# Patient Record
Sex: Female | Born: 1965 | Race: White | Hispanic: No | Marital: Single | Smoking: Never smoker
Health system: Southern US, Community
[De-identification: ages and names within clinical notes are randomized; demographics above are authoritative.]

## PROBLEM LIST (undated history)

## (undated) DIAGNOSIS — R1012 Left upper quadrant pain: Secondary | ICD-10-CM

## (undated) DIAGNOSIS — J309 Allergic rhinitis, unspecified: Secondary | ICD-10-CM

## (undated) DIAGNOSIS — E785 Hyperlipidemia, unspecified: Secondary | ICD-10-CM

## (undated) DIAGNOSIS — E119 Type 2 diabetes mellitus without complications: Secondary | ICD-10-CM

## (undated) DIAGNOSIS — R110 Nausea: Secondary | ICD-10-CM

## (undated) DIAGNOSIS — Z9889 Other specified postprocedural states: Secondary | ICD-10-CM

## (undated) DIAGNOSIS — I10 Essential (primary) hypertension: Secondary | ICD-10-CM

## (undated) DIAGNOSIS — M1990 Unspecified osteoarthritis, unspecified site: Secondary | ICD-10-CM

## (undated) DIAGNOSIS — R51 Headache: Secondary | ICD-10-CM

## (undated) DIAGNOSIS — J45909 Unspecified asthma, uncomplicated: Secondary | ICD-10-CM

## (undated) DIAGNOSIS — R112 Nausea with vomiting, unspecified: Secondary | ICD-10-CM

## (undated) DIAGNOSIS — K589 Irritable bowel syndrome without diarrhea: Secondary | ICD-10-CM

## (undated) DIAGNOSIS — K219 Gastro-esophageal reflux disease without esophagitis: Secondary | ICD-10-CM

## (undated) DIAGNOSIS — M545 Low back pain: Secondary | ICD-10-CM

## (undated) HISTORY — DX: Nausea: R11.0

## (undated) HISTORY — PX: KNEE SURGERY: SHX244

## (undated) HISTORY — DX: Low back pain: M54.5

## (undated) HISTORY — DX: Allergic rhinitis, unspecified: J30.9

## (undated) HISTORY — DX: Hyperlipidemia, unspecified: E78.5

## (undated) HISTORY — DX: Essential (primary) hypertension: I10

## (undated) HISTORY — PX: TUBAL LIGATION: SHX77

## (undated) HISTORY — DX: Irritable bowel syndrome, unspecified: K58.9

## (undated) HISTORY — DX: Unspecified asthma, uncomplicated: J45.909

## (undated) HISTORY — DX: Left upper quadrant pain: R10.12

## (undated) HISTORY — DX: Headache: R51

## (undated) HISTORY — DX: Gastro-esophageal reflux disease without esophagitis: K21.9

---

## 1986-10-06 HISTORY — PX: KNEE SURGERY: SHX244

## 1993-10-06 HISTORY — PX: NASAL POLYP SURGERY: SHX186

## 1998-11-09 ENCOUNTER — Other Ambulatory Visit: Admission: RE | Admit: 1998-11-09 | Discharge: 1998-11-09 | Payer: Self-pay | Admitting: Internal Medicine

## 2001-06-01 ENCOUNTER — Other Ambulatory Visit: Admission: RE | Admit: 2001-06-01 | Discharge: 2001-06-01 | Payer: Self-pay | Admitting: Internal Medicine

## 2001-10-22 ENCOUNTER — Encounter: Admission: RE | Admit: 2001-10-22 | Discharge: 2001-10-22 | Payer: Self-pay | Admitting: Internal Medicine

## 2001-10-22 ENCOUNTER — Encounter: Payer: Self-pay | Admitting: Internal Medicine

## 2002-11-11 ENCOUNTER — Inpatient Hospital Stay (HOSPITAL_COMMUNITY): Admission: AD | Admit: 2002-11-11 | Discharge: 2002-11-11 | Payer: Self-pay | Admitting: Family Medicine

## 2003-10-07 HISTORY — PX: LUMBAR FUSION: SHX111

## 2003-12-06 ENCOUNTER — Encounter: Admission: RE | Admit: 2003-12-06 | Discharge: 2003-12-06 | Payer: Self-pay | Admitting: Internal Medicine

## 2004-01-12 ENCOUNTER — Encounter: Admission: RE | Admit: 2004-01-12 | Discharge: 2004-01-12 | Payer: Self-pay | Admitting: Neurosurgery

## 2004-03-20 ENCOUNTER — Inpatient Hospital Stay (HOSPITAL_COMMUNITY): Admission: RE | Admit: 2004-03-20 | Discharge: 2004-03-22 | Payer: Self-pay | Admitting: Neurosurgery

## 2004-03-28 ENCOUNTER — Encounter: Admission: RE | Admit: 2004-03-28 | Discharge: 2004-03-28 | Payer: Self-pay | Admitting: Neurosurgery

## 2004-05-02 ENCOUNTER — Encounter: Admission: RE | Admit: 2004-05-02 | Discharge: 2004-05-02 | Payer: Self-pay | Admitting: Neurosurgery

## 2004-06-04 ENCOUNTER — Encounter: Admission: RE | Admit: 2004-06-04 | Discharge: 2004-06-04 | Payer: Self-pay | Admitting: Neurosurgery

## 2004-06-09 ENCOUNTER — Encounter: Admission: RE | Admit: 2004-06-09 | Discharge: 2004-06-09 | Payer: Self-pay | Admitting: Neurosurgery

## 2004-09-17 ENCOUNTER — Ambulatory Visit: Payer: Self-pay | Admitting: Internal Medicine

## 2004-11-07 ENCOUNTER — Encounter: Admission: RE | Admit: 2004-11-07 | Discharge: 2004-11-07 | Payer: Self-pay | Admitting: Neurosurgery

## 2004-12-11 ENCOUNTER — Ambulatory Visit: Payer: Self-pay | Admitting: Internal Medicine

## 2005-01-28 ENCOUNTER — Ambulatory Visit: Payer: Self-pay | Admitting: Internal Medicine

## 2005-02-04 ENCOUNTER — Ambulatory Visit: Payer: Self-pay | Admitting: Internal Medicine

## 2005-02-11 ENCOUNTER — Encounter: Admission: RE | Admit: 2005-02-11 | Discharge: 2005-02-11 | Payer: Self-pay | Admitting: Internal Medicine

## 2005-05-08 ENCOUNTER — Encounter: Admission: RE | Admit: 2005-05-08 | Discharge: 2005-05-08 | Payer: Self-pay | Admitting: Neurosurgery

## 2005-05-12 ENCOUNTER — Ambulatory Visit: Payer: Self-pay | Admitting: Internal Medicine

## 2005-06-13 ENCOUNTER — Encounter: Admission: RE | Admit: 2005-06-13 | Discharge: 2005-06-13 | Payer: Self-pay | Admitting: Internal Medicine

## 2005-06-13 ENCOUNTER — Ambulatory Visit: Payer: Self-pay | Admitting: Internal Medicine

## 2005-06-17 ENCOUNTER — Ambulatory Visit: Payer: Self-pay

## 2005-10-01 ENCOUNTER — Ambulatory Visit: Payer: Self-pay | Admitting: Endocrinology

## 2005-11-26 ENCOUNTER — Ambulatory Visit: Payer: Self-pay | Admitting: Internal Medicine

## 2005-11-26 ENCOUNTER — Ambulatory Visit: Payer: Self-pay | Admitting: Cardiology

## 2006-01-13 ENCOUNTER — Ambulatory Visit (HOSPITAL_COMMUNITY): Admission: RE | Admit: 2006-01-13 | Discharge: 2006-01-13 | Payer: Self-pay | Admitting: *Deleted

## 2006-01-28 ENCOUNTER — Ambulatory Visit: Payer: Self-pay | Admitting: Internal Medicine

## 2006-03-31 ENCOUNTER — Ambulatory Visit: Payer: Self-pay | Admitting: Internal Medicine

## 2006-04-15 ENCOUNTER — Encounter: Payer: Self-pay | Admitting: Internal Medicine

## 2006-04-15 ENCOUNTER — Ambulatory Visit (HOSPITAL_COMMUNITY): Admission: RE | Admit: 2006-04-15 | Discharge: 2006-04-15 | Payer: Self-pay | Admitting: Internal Medicine

## 2006-05-05 ENCOUNTER — Ambulatory Visit: Payer: Self-pay | Admitting: Internal Medicine

## 2006-05-05 LAB — HM COLONOSCOPY

## 2006-05-07 ENCOUNTER — Ambulatory Visit: Payer: Self-pay | Admitting: Internal Medicine

## 2006-05-12 ENCOUNTER — Ambulatory Visit: Payer: Self-pay | Admitting: Internal Medicine

## 2006-05-21 ENCOUNTER — Ambulatory Visit: Payer: Self-pay | Admitting: Internal Medicine

## 2006-07-02 ENCOUNTER — Ambulatory Visit: Payer: Self-pay | Admitting: Internal Medicine

## 2006-08-09 ENCOUNTER — Inpatient Hospital Stay (HOSPITAL_COMMUNITY): Admission: AD | Admit: 2006-08-09 | Discharge: 2006-08-09 | Payer: Self-pay | Admitting: *Deleted

## 2006-08-31 ENCOUNTER — Ambulatory Visit: Payer: Self-pay | Admitting: Internal Medicine

## 2006-09-02 ENCOUNTER — Ambulatory Visit: Payer: Self-pay | Admitting: Internal Medicine

## 2006-09-14 ENCOUNTER — Encounter: Admission: RE | Admit: 2006-09-14 | Discharge: 2006-12-13 | Payer: Self-pay | Admitting: *Deleted

## 2006-09-14 ENCOUNTER — Encounter: Admission: RE | Admit: 2006-09-14 | Discharge: 2006-09-14 | Payer: Self-pay | Admitting: *Deleted

## 2006-09-24 ENCOUNTER — Ambulatory Visit: Payer: Self-pay | Admitting: Internal Medicine

## 2006-10-12 ENCOUNTER — Ambulatory Visit: Payer: Self-pay | Admitting: Internal Medicine

## 2006-11-27 ENCOUNTER — Ambulatory Visit (HOSPITAL_COMMUNITY): Admission: RE | Admit: 2006-11-27 | Discharge: 2006-11-27 | Payer: Self-pay | Admitting: *Deleted

## 2006-12-14 ENCOUNTER — Inpatient Hospital Stay (HOSPITAL_COMMUNITY): Admission: AD | Admit: 2006-12-14 | Discharge: 2006-12-15 | Payer: Self-pay | Admitting: *Deleted

## 2007-01-10 ENCOUNTER — Inpatient Hospital Stay (HOSPITAL_COMMUNITY): Admission: AD | Admit: 2007-01-10 | Discharge: 2007-01-12 | Payer: Self-pay | Admitting: Obstetrics and Gynecology

## 2007-01-20 ENCOUNTER — Ambulatory Visit: Payer: Self-pay | Admitting: Internal Medicine

## 2007-01-23 ENCOUNTER — Inpatient Hospital Stay (HOSPITAL_COMMUNITY): Admission: AD | Admit: 2007-01-23 | Discharge: 2007-01-23 | Payer: Self-pay | Admitting: Obstetrics and Gynecology

## 2007-01-27 ENCOUNTER — Inpatient Hospital Stay (HOSPITAL_COMMUNITY): Admission: AD | Admit: 2007-01-27 | Discharge: 2007-01-27 | Payer: Self-pay | Admitting: Obstetrics and Gynecology

## 2007-01-28 ENCOUNTER — Inpatient Hospital Stay (HOSPITAL_COMMUNITY): Admission: AD | Admit: 2007-01-28 | Discharge: 2007-01-31 | Payer: Self-pay | Admitting: *Deleted

## 2007-01-28 ENCOUNTER — Encounter (INDEPENDENT_AMBULATORY_CARE_PROVIDER_SITE_OTHER): Payer: Self-pay | Admitting: Specialist

## 2007-02-11 ENCOUNTER — Ambulatory Visit: Payer: Self-pay | Admitting: Internal Medicine

## 2007-04-22 ENCOUNTER — Encounter: Payer: Self-pay | Admitting: Internal Medicine

## 2007-04-22 DIAGNOSIS — I10 Essential (primary) hypertension: Secondary | ICD-10-CM

## 2007-04-22 DIAGNOSIS — E785 Hyperlipidemia, unspecified: Secondary | ICD-10-CM

## 2007-04-22 DIAGNOSIS — K219 Gastro-esophageal reflux disease without esophagitis: Secondary | ICD-10-CM

## 2007-04-22 DIAGNOSIS — J45909 Unspecified asthma, uncomplicated: Secondary | ICD-10-CM

## 2007-04-22 HISTORY — DX: Essential (primary) hypertension: I10

## 2007-04-22 HISTORY — DX: Gastro-esophageal reflux disease without esophagitis: K21.9

## 2007-04-22 HISTORY — DX: Hyperlipidemia, unspecified: E78.5

## 2007-04-22 HISTORY — DX: Unspecified asthma, uncomplicated: J45.909

## 2007-04-27 ENCOUNTER — Ambulatory Visit: Payer: Self-pay | Admitting: Internal Medicine

## 2007-07-07 LAB — HM MAMMOGRAPHY

## 2007-08-04 ENCOUNTER — Encounter: Payer: Self-pay | Admitting: Internal Medicine

## 2007-09-28 ENCOUNTER — Ambulatory Visit: Payer: Self-pay | Admitting: Internal Medicine

## 2007-10-05 ENCOUNTER — Ambulatory Visit: Payer: Self-pay | Admitting: Internal Medicine

## 2007-10-05 LAB — CONVERTED CEMR LAB
Albumin: 3.9 g/dL (ref 3.5–5.2)
BUN: 15 mg/dL (ref 6–23)
Basophils Relative: 0.6 % (ref 0.0–1.0)
Bilirubin Urine: NEGATIVE
GFR calc Af Amer: 89 mL/min
GFR calc non Af Amer: 74 mL/min
HCT: 37.1 % (ref 36.0–46.0)
HDL: 43.8 mg/dL (ref >39.0)
Leukocytes, UA: NEGATIVE
Lymphocytes Relative: 20.5 % (ref 12.0–46.0)
MCHC: 33.9 g/dL (ref 30.0–36.0)
MCV: 90.4 fL (ref 78.0–100.0)
Monocytes Absolute: 0.5 10*3/uL (ref 0.2–0.7)
Neutro Abs: 3.6 10*3/uL (ref 1.4–7.7)
Neutrophils Relative %: 64.9 % (ref 43.0–77.0)
Nitrite: NEGATIVE
Potassium: 4.3 meq/L (ref 3.5–5.1)
RDW: 12 % (ref 11.5–14.6)
Sodium: 139 meq/L (ref 135–145)
Specific Gravity, Urine: >=1.03 (ref 1.000–1.03)
TSH: 1.36 microintl units/mL (ref 0.35–5.50)
Total Protein, Urine: NEGATIVE mg/dL
Triglycerides: 198 mg/dL — ABNORMAL HIGH (ref 0–149)
Urine Glucose: NEGATIVE mg/dL
VLDL: 40 mg/dL (ref 0–40)
WBC: 5.5 10*3/uL (ref 4.5–10.5)
pH: 5.5 (ref 5.0–8.0)

## 2007-10-06 ENCOUNTER — Encounter: Payer: Self-pay | Admitting: Internal Medicine

## 2007-10-08 ENCOUNTER — Ambulatory Visit: Payer: Self-pay | Admitting: Internal Medicine

## 2007-10-08 DIAGNOSIS — J309 Allergic rhinitis, unspecified: Secondary | ICD-10-CM

## 2007-10-08 DIAGNOSIS — E119 Type 2 diabetes mellitus without complications: Secondary | ICD-10-CM | POA: Insufficient documentation

## 2007-10-08 DIAGNOSIS — R519 Headache, unspecified: Secondary | ICD-10-CM | POA: Insufficient documentation

## 2007-10-08 DIAGNOSIS — R05 Cough: Secondary | ICD-10-CM | POA: Insufficient documentation

## 2007-10-08 DIAGNOSIS — R51 Headache: Secondary | ICD-10-CM

## 2007-10-08 HISTORY — DX: Allergic rhinitis, unspecified: J30.9

## 2007-10-08 HISTORY — DX: Headache: R51

## 2007-11-01 ENCOUNTER — Telehealth: Payer: Self-pay | Admitting: Internal Medicine

## 2007-11-08 ENCOUNTER — Telehealth: Payer: Self-pay | Admitting: Internal Medicine

## 2007-11-26 ENCOUNTER — Encounter: Payer: Self-pay | Admitting: Internal Medicine

## 2008-01-07 ENCOUNTER — Ambulatory Visit: Payer: Self-pay | Admitting: Internal Medicine

## 2008-01-07 LAB — CONVERTED CEMR LAB
BUN: 18 mg/dL (ref 6–23)
Chloride: 107 meq/L (ref 96–112)
Cholesterol: 242 mg/dL (ref 0–200)
Creatinine, Ser: 0.9 mg/dL (ref 0.4–1.2)
Direct LDL: 180.3 mg/dL
HDL: 36.9 mg/dL — ABNORMAL LOW (ref >39.0)
Hgb A1c MFr Bld: 6.5 % — ABNORMAL HIGH (ref 4.6–6.0)
Triglycerides: 143 mg/dL (ref 0–149)
VLDL: 29 mg/dL (ref 0–40)

## 2008-01-08 ENCOUNTER — Encounter: Payer: Self-pay | Admitting: Internal Medicine

## 2008-01-10 ENCOUNTER — Encounter: Payer: Self-pay | Admitting: Internal Medicine

## 2008-01-25 ENCOUNTER — Encounter: Payer: Self-pay | Admitting: Internal Medicine

## 2008-02-14 ENCOUNTER — Ambulatory Visit: Payer: Self-pay | Admitting: Internal Medicine

## 2008-02-14 DIAGNOSIS — J069 Acute upper respiratory infection, unspecified: Secondary | ICD-10-CM | POA: Insufficient documentation

## 2008-03-17 ENCOUNTER — Ambulatory Visit: Payer: Self-pay | Admitting: Internal Medicine

## 2008-03-17 LAB — CONVERTED CEMR LAB
ALT: 28 U/L
AST: 23 U/L
Albumin: 4.1 g/dL
Alkaline Phosphatase: 46 U/L
Bilirubin, Direct: 0.1 mg/dL
Cholesterol: 230 mg/dL
Direct LDL: 154.4 mg/dL
HDL: 46 mg/dL
Total Bilirubin: 0.8 mg/dL
Total CHOL/HDL Ratio: 5
Total Protein: 6.3 g/dL
Triglycerides: 137 mg/dL
VLDL: 27 mg/dL

## 2008-03-21 ENCOUNTER — Encounter: Payer: Self-pay | Admitting: Internal Medicine

## 2008-04-14 ENCOUNTER — Telehealth: Payer: Self-pay | Admitting: Internal Medicine

## 2008-04-20 ENCOUNTER — Ambulatory Visit: Payer: Self-pay | Admitting: Internal Medicine

## 2008-04-20 LAB — CONVERTED CEMR LAB
Cholesterol: 197 mg/dL (ref 0–200)
Total CHOL/HDL Ratio: 4.6
VLDL: 20 mg/dL (ref 0–40)

## 2008-04-23 ENCOUNTER — Encounter: Payer: Self-pay | Admitting: Internal Medicine

## 2008-06-27 ENCOUNTER — Ambulatory Visit: Payer: Self-pay | Admitting: Internal Medicine

## 2008-06-27 DIAGNOSIS — M545 Low back pain, unspecified: Secondary | ICD-10-CM

## 2008-06-27 DIAGNOSIS — J0100 Acute maxillary sinusitis, unspecified: Secondary | ICD-10-CM

## 2008-06-27 HISTORY — DX: Low back pain, unspecified: M54.50

## 2008-07-11 ENCOUNTER — Encounter: Payer: Self-pay | Admitting: Internal Medicine

## 2008-09-06 ENCOUNTER — Telehealth: Payer: Self-pay | Admitting: Internal Medicine

## 2008-09-11 ENCOUNTER — Telehealth: Payer: Self-pay | Admitting: Internal Medicine

## 2008-10-05 ENCOUNTER — Ambulatory Visit: Payer: Self-pay | Admitting: Internal Medicine

## 2008-10-05 LAB — CONVERTED CEMR LAB
ALT: 33 units/L (ref 0–35)
Albumin: 3.8 g/dL (ref 3.5–5.2)
BUN: 15 mg/dL (ref 6–23)
Chloride: 105 meq/L (ref 96–112)
Cholesterol: 216 mg/dL (ref 0–200)
Direct LDL: 134.7 mg/dL
Glucose, Bld: 119 mg/dL — ABNORMAL HIGH (ref 70–99)
HCT: 35.2 % — ABNORMAL LOW (ref 36.0–46.0)
HDL: 52.3 mg/dL (ref >39.0)
Leukocytes, UA: NEGATIVE
Lymphocytes Relative: 25.7 % (ref 12.0–46.0)
MCHC: 32.9 g/dL (ref 30.0–36.0)
Monocytes Relative: 7.4 % (ref 3.0–12.0)
Neutro Abs: 2.9 10*3/uL (ref 1.4–7.7)
Neutrophils Relative %: 59.9 % (ref 43.0–77.0)
Nitrite: NEGATIVE
Platelets: 178 10*3/uL (ref 150–400)
Potassium: 3.8 meq/L (ref 3.5–5.1)
RBC: 4.44 M/uL (ref 3.87–5.11)
RDW: 14.8 % — ABNORMAL HIGH (ref 11.5–14.6)
Sodium: 140 meq/L (ref 135–145)
Specific Gravity, Urine: 1.025 (ref 1.000–1.03)
TSH: 1.94 microintl units/mL (ref 0.35–5.50)
Total Protein: 5.9 g/dL — ABNORMAL LOW (ref 6.0–8.3)
Triglycerides: 141 mg/dL (ref 0–149)
Urine Glucose: NEGATIVE mg/dL
Urobilinogen, UA: 0.2 (ref 0.0–1.0)
pH: 6.5 (ref 5.0–8.0)

## 2008-10-09 ENCOUNTER — Ambulatory Visit: Payer: Self-pay | Admitting: Internal Medicine

## 2008-10-11 ENCOUNTER — Telehealth: Payer: Self-pay | Admitting: Internal Medicine

## 2008-12-25 ENCOUNTER — Telehealth: Payer: Self-pay | Admitting: Internal Medicine

## 2009-01-19 ENCOUNTER — Encounter: Payer: Self-pay | Admitting: Internal Medicine

## 2009-01-29 ENCOUNTER — Encounter: Payer: Self-pay | Admitting: Internal Medicine

## 2009-02-03 ENCOUNTER — Encounter: Payer: Self-pay | Admitting: Internal Medicine

## 2009-02-06 ENCOUNTER — Ambulatory Visit: Payer: Self-pay | Admitting: Internal Medicine

## 2009-02-13 ENCOUNTER — Telehealth: Payer: Self-pay | Admitting: Internal Medicine

## 2009-02-14 ENCOUNTER — Ambulatory Visit: Payer: Self-pay | Admitting: Internal Medicine

## 2009-02-15 ENCOUNTER — Encounter: Payer: Self-pay | Admitting: Internal Medicine

## 2009-02-23 ENCOUNTER — Telehealth: Payer: Self-pay | Admitting: Internal Medicine

## 2009-02-28 ENCOUNTER — Ambulatory Visit: Payer: Self-pay | Admitting: Internal Medicine

## 2009-02-28 ENCOUNTER — Encounter: Payer: Self-pay | Admitting: Internal Medicine

## 2009-02-28 DIAGNOSIS — J45901 Unspecified asthma with (acute) exacerbation: Secondary | ICD-10-CM | POA: Insufficient documentation

## 2009-03-13 ENCOUNTER — Ambulatory Visit: Payer: Self-pay | Admitting: Internal Medicine

## 2009-03-19 ENCOUNTER — Telehealth: Payer: Self-pay | Admitting: Internal Medicine

## 2009-03-27 ENCOUNTER — Ambulatory Visit: Payer: Self-pay | Admitting: Internal Medicine

## 2009-03-27 LAB — HM COLONOSCOPY

## 2009-04-16 ENCOUNTER — Telehealth: Payer: Self-pay | Admitting: Internal Medicine

## 2009-05-25 ENCOUNTER — Ambulatory Visit: Payer: Self-pay | Admitting: Internal Medicine

## 2009-05-25 DIAGNOSIS — R197 Diarrhea, unspecified: Secondary | ICD-10-CM

## 2009-06-20 ENCOUNTER — Telehealth (INDEPENDENT_AMBULATORY_CARE_PROVIDER_SITE_OTHER): Payer: Self-pay | Admitting: *Deleted

## 2009-12-03 ENCOUNTER — Encounter: Payer: Self-pay | Admitting: Internal Medicine

## 2009-12-05 ENCOUNTER — Ambulatory Visit: Payer: Self-pay | Admitting: Internal Medicine

## 2009-12-05 DIAGNOSIS — R062 Wheezing: Secondary | ICD-10-CM

## 2010-07-21 ENCOUNTER — Encounter: Payer: Self-pay | Admitting: Internal Medicine

## 2010-07-30 ENCOUNTER — Ambulatory Visit: Payer: Self-pay | Admitting: Internal Medicine

## 2010-08-05 ENCOUNTER — Ambulatory Visit: Payer: Self-pay | Admitting: Internal Medicine

## 2010-08-05 DIAGNOSIS — J209 Acute bronchitis, unspecified: Secondary | ICD-10-CM

## 2010-08-30 ENCOUNTER — Ambulatory Visit: Payer: Self-pay | Admitting: Internal Medicine

## 2010-08-30 LAB — CONVERTED CEMR LAB
ALT: 25 U/L
AST: 23 U/L
Albumin: 4.3 g/dL
Alkaline Phosphatase: 48 U/L
BUN: 16 mg/dL
Basophils Absolute: 0 K/uL
Basophils Relative: 0.6 %
Bilirubin Urine: NEGATIVE
Bilirubin, Direct: 0.1 mg/dL
CO2: 26 meq/L
Calcium: 9.6 mg/dL
Chloride: 105 meq/L
Cholesterol: 248 mg/dL — ABNORMAL HIGH
Creatinine, Ser: 0.9 mg/dL
Direct LDL: 172.6 mg/dL
Eosinophils Absolute: 0.2 K/uL
Eosinophils Relative: 3.8 %
GFR calc non Af Amer: 73.3 mL/min
Glucose, Bld: 133 mg/dL — ABNORMAL HIGH
HCT: 37.2 %
HDL: 55.9 mg/dL
Hemoglobin, Urine: NEGATIVE
Hemoglobin: 12.3 g/dL
Ketones, ur: NEGATIVE mg/dL
Leukocytes, UA: NEGATIVE
Lymphocytes Relative: 26.6 %
Lymphs Abs: 1.5 K/uL
MCHC: 33.1 g/dL
MCV: 77.2 fL — ABNORMAL LOW
Monocytes Absolute: 0.5 K/uL
Monocytes Relative: 8.5 %
Neutro Abs: 3.4 K/uL
Neutrophils Relative %: 60.5 %
Nitrite: NEGATIVE
Platelets: 216 K/uL
Potassium: 4.2 meq/L
RBC: 4.83 M/uL
RDW: 17.7 % — ABNORMAL HIGH
Sodium: 134 meq/L — ABNORMAL LOW
Specific Gravity, Urine: 1.025
TSH: 1.86 u[IU]/mL
Total Bilirubin: 0.7 mg/dL
Total CHOL/HDL Ratio: 4
Total Protein, Urine: NEGATIVE mg/dL
Total Protein: 6.7 g/dL
Triglycerides: 89 mg/dL
Urine Glucose: NEGATIVE mg/dL
Urobilinogen, UA: 0.2
VLDL: 17.8 mg/dL
WBC: 5.6 10*3/microliter
pH: 6

## 2010-09-05 ENCOUNTER — Telehealth: Payer: Self-pay | Admitting: Internal Medicine

## 2010-09-05 ENCOUNTER — Ambulatory Visit: Payer: Self-pay | Admitting: Internal Medicine

## 2010-09-06 ENCOUNTER — Ambulatory Visit: Payer: Self-pay | Admitting: Gastroenterology

## 2010-09-06 DIAGNOSIS — R110 Nausea: Secondary | ICD-10-CM

## 2010-09-06 DIAGNOSIS — R1012 Left upper quadrant pain: Secondary | ICD-10-CM

## 2010-09-06 HISTORY — DX: Left upper quadrant pain: R10.12

## 2010-09-06 HISTORY — DX: Nausea: R11.0

## 2010-09-10 ENCOUNTER — Encounter: Payer: Self-pay | Admitting: Internal Medicine

## 2010-10-02 ENCOUNTER — Ambulatory Visit: Payer: Self-pay | Admitting: Internal Medicine

## 2010-10-15 ENCOUNTER — Telehealth: Payer: Self-pay | Admitting: Nurse Practitioner

## 2010-10-27 ENCOUNTER — Encounter: Payer: Self-pay | Admitting: *Deleted

## 2010-11-05 NOTE — Assessment & Plan Note (Signed)
Summary: Cpx/#/cd   Vital Signs:  Patient profile:   45 year old female Height:      63 inches Weight:      142 pounds BMI:     25.25 O2 Sat:      96 % on Room air Temp:     98.0 degrees F oral Pulse rate:   81 / minute BP sitting:   120 / 88  (left arm) Cuff size:   regular  Vitals Entered By: Bill Salinas CMA (September 05, 2010 1:31 PM)  O2 Flow:  Room air CC: cpx/ ab Comments PT is not taking lovastatin, pulmicort, piroxicam, amitiza   Primary Care Provider:  Micheal Norins,MD  CC:  cpx/ ab.  History of Present Illness: Patient presents for annual medical wellness exam. She is concerned about cholesterol level - which were high with LDL 176; blood sugar issues due to family history and serum glucose was 133.   She does feel that she does better with singualr in regard to her asthma and would like to resume this medication.   Chronic back pain: has become more of an issue since her last pregnancy. She is limited in her activities. Discussed PT guided physical therapy as well as using ouTube as a resource for exercise videos.   Abdominal pain LUQ that can be severe and prolonged with some radiaiton to the back and also some reflux type characteristics. Concerned for atypical gallbladder vs dyspepsia vs reflux vs other.   Current Medications (verified): 1)  Allgery Shots .... Twice Monthly 2)  Zegerid 40-1100 Mg Caps (Omeprazole-Sodium Bicarbonate) .Marland Kitchen.. 1 Once Daily 3)  Ventolin Hfa 108 (90 Base) Mcg/act  Aers (Albuterol Sulfate) .... 60 Metered Dose Ihaler, Two Puffs Qid Prn 4)  Rhinocort Aqua 32 Mcg/act  Susp (Budesonide (Nasal)) .... As Needed 5)  Lisinopril-Hydrochlorothiazide 10-12.5 Mg  Tabs (Lisinopril-Hydrochlorothiazide) .Marland Kitchen.. 1 Once Daily 6)  Lovastatin 20 Mg  Tabs (Lovastatin) .... 2 By Mouth Q Pm 7)  Zyrtec-D Allergy & Congestion 5-120 Mg Xr12h-Tab (Cetirizine-Pseudoephedrine) .... Take 1 Tablet By Mouth Once A Day 8)  Ibuprofen 800 Mg Tabs (Ibuprofen) .... Take 1  Tablet By Mouth Two Times A Day 9)  Zolpidem Tartrate 5 Mg Tabs (Zolpidem Tartrate) .Marland Kitchen.. 1 By Mouth At Bedtime 10)  Pulmicort .... Two Puffs Daily 11)  Butalbital-Apap-Caffeine 50-325-40 Mg Caps (Butalbital-Apap-Caffeine) .Marland Kitchen.. 1 By Mouth As Needed For Migraine 12)  Piroxicam 20 Mg Caps (Piroxicam) .Marland Kitchen.. 1 By Mouth Once Daily 13)  Multivitamins   Tabs (Multiple Vitamin) .Marland Kitchen.. 1 By Mouth Once Daily 14)  Amitiza 8 Mcg  Caps (Lubiprostone) .Marland Kitchen.. 1 Two Times A Day/take With Food and Water 15)  Analpram-Hc 1-2.5 %  Crea (Hydrocortisone Ace-Pramoxine) .... Apply As Needed 16)  Fluocinonide 0.05 % Crea (Fluocinonide)  Allergies (verified): 1)  ! Pcn  Past History:  Past Medical History: CONSTIPATION-PREDOMIANT IBS LOW BACK PAIN (ICD-724.2) HEADACHE (ICD-784.0) ALLERGIC RHINITIS (ICD-477.9) HYPERTENSION (ICD-401.9) HYPERLIPIDEMIA (ICD-272.4) GERD (ICD-530.81) ASTHMA (ICD-493.90)   Physician Roster:          Allergy - [facility] ([facility])          Gyn - [facility] for OB          ENT- Narda Bonds          Ortho- Ophelia Charter          [employer]  Family History: father-died 02-17-65, pulmonary fibrosis; CAD;DM; Lipids; HTN mother - 02-17-38; HTN, OA s/p bilateral hip replacement 2 sisters - both with osteopenia,  older with GI problems s/p colectomy MGM - ovarian cancer Neg- breast, colon cancer  Review of Systems       The patient complains of weight loss, abdominal pain, hematochezia, and difficulty walking.  The patient denies vision loss, decreased hearing, hoarseness, chest pain, syncope, dyspnea on exertion, peripheral edema, prolonged cough, headaches, hemoptysis, melena, severe indigestion/heartburn, incontinence, muscle weakness, transient blindness, depression, abnormal bleeding, and enlarged lymph nodes.         15 lbs weight loss through diet and a guidance program. Hematochezia from hemorrhoids  Physical Exam  General:  Well-developed,well-nourished,in no acute distress; alert,appropriate and  cooperative throughout examination Head:  Normocephalic and atraumatic without obvious abnormalities. No apparent alopecia or balding. Eyes:  vision grossly intact, pupils equal, pupils round, corneas and lenses clear, no injection, no optic disk abnormalities, and no retinal abnormalitiies.   Ears:  External ear exam shows no significant lesions or deformities.  Otoscopic examination reveals clear canals, tympanic membranes are intact bilaterally without bulging, retraction, inflammation or discharge. Hearing is grossly normal bilaterally. Nose:  no external deformity and no external erythema.   Mouth:  Oral mucosa and oropharynx without lesions or exudates.  Teeth in good repair. Neck:  supple, no masses, no thyromegaly, and no carotid bruits.   Chest Wall:  No deformities, masses, or tenderness noted. Breasts:  No mass, nodules, thickening, tenderness, bulging, retraction, inflamation, nipple discharge or skin changes noted.   Lungs:  Normal respiratory effort, chest expands symmetrically. Lungs are clear to auscultation, no crackles or wheezes. Heart:  Normal rate and regular rhythm. S1 and S2 normal without gallop, murmur, click, rub or other extra sounds. Abdomen:  soft, normal bowel sounds, no distention, no guarding, no rigidity, and no hepatomegaly.   Genitalia:  deferred: not due for 1-2 years Msk:  normal ROM, no joint tenderness, no joint swelling, no joint warmth, no joint deformities, and no joint instability.   Pulses:  2+ radial and DP pulses Extremities:  No clubbing, cyanosis, edema, or deformity noted with normal full range of motion of all joints.   Neurologic:  alert & oriented X3, cranial nerves II-XII intact, strength normal in all extremities, gait normal, and DTRs symmetrical and normal.   Skin:  turgor normal, color normal, and no suspicious lesions.   Cervical Nodes:  no anterior cervical adenopathy and no posterior cervical adenopathy.   Axillary Nodes:  no R axillary  adenopathy and no L axillary adenopathy.   Psych:  Oriented X3, memory intact for recent and remote, normally interactive, good eye contact, and not anxious appearing.     Impression & Recommendations:  Problem # 1:  LOW BACK PAIN (ICD-724.2) Chronic probloem which does limit her activity. She does get relief with ibuprofen taken as needed.  Plan - renew ibuprofen           recommend consultation with a formally trained trainer or PT eval with development of training regimen for her back           recommend reviewing YouTube videos on back exercises .   The following medications were removed from the medication list:    Piroxicam 20 Mg Caps (Piroxicam) .Marland Kitchen... 1 by mouth once daily Her updated medication list for this problem includes:    Ibuprofen 800 Mg Tabs (Ibuprofen) .Marland Kitchen... Take 1 tablet by mouth two times a day    Butalbital-apap-caffeine 50-325-40 Mg Caps (Butalbital-apap-caffeine) .Marland Kitchen... 1 by mouth as needed for migraine    Flexeril 10 Mg Tabs (Cyclobenzaprine hcl) .Marland KitchenMarland KitchenMarland KitchenMarland Kitchen  Take 1/2 tab twice daily as needed  Problem # 2:  FASTING HYPERGLYCEMIA (ICD-790.29) Patient with a fasting glucose of 133. This is definitely above normal.  Plan - no sugar and low carb diet along with regular exercise.           Will need an A1C at next lab draw with recommendations to follow in regard to medical therapy  Problem # 3:  ALLERGIC RHINITIS (ICD-477.9) Currently stable. There are seasonal exacerbations  Plan - continue with nasal inhalaltional steroids as needed           Rx for singulair to use in season  Her updated medication list for this problem includes:    Rhinocort Aqua 32 Mcg/act Susp (Budesonide (nasal)) .Marland Kitchen... As needed  Problem # 4:  HYPERTENSION (ICD-401.9)  Her updated medication list for this problem includes:    Lisinopril-hydrochlorothiazide 10-12.5 Mg Tabs (Lisinopril-hydrochlorothiazide) .Marland Kitchen... 1 once daily  BP today: 120/88 Prior BP: 118/72 (08/05/2010)  Labs  Reviewed: K+: 4.2 (08/30/2010) Creat: : 0.9 (08/30/2010)     Good control - continue present medications.  Problem # 5:  HYPERLIPIDEMIA (ICD-272.4) LDL 172, altough HDL is above goal and fine. Reveiwed previous labs and not she had a good response to "statin" medications in the past and that this was well tolerated.  Plan - resume lovastatin 40 mg daily          f/u lab work in 8-12 weeks with adjustments in meds as needed.  Her updated medication list for this problem includes:    Lovastatin 40 Mg Tabs (Lovastatin) .Marland Kitchen... 1 by mouth qpm for lipid management  Problem # 6:  ASTHMA (ICD-493.90) Stable at this time.  Her updated medication list for this problem includes:    Ventolin Hfa 108 (90 Base) Mcg/act Aers (Albuterol sulfate) .Marland KitchenMarland KitchenMarland KitchenMarland Kitchen 60 metered dose ihaler, two puffs qid prn    Singulair 10 Mg Tabs (Montelukast sodium) .Marland Kitchen... 1 by mouth once daily  Problem # 7:  IBS- CONSTIPATION PREDOMINANT (ICD-564.1) Patient is having increased abdominal pain. She has a h/o IBS and colitis.  Plan  patient will consult GI  Problem # 8:  Preventive Health Care (ICD-V70.0) Interval history is marked by both back and abdominal pain. No hospitalizations, surgery or injuries noted. Physical exam is normal. Patient is safe to have Pelvic/PAP exams every 3rd year. Normal breast exam. Did discuss the pros and cons of mammography prior to age 37. She has had recent colonoscopy. Immunizations are up-to-date. Lab results are OK except for lipids and elevated serum glucose.  In summary - a very nice woman who has multiple medical problems mostly under good control. She will return as needed. She will return for lab as noted.   Complete Medication List: 1)  Zegerid 40-1100 Mg Caps (Omeprazole-sodium bicarbonate) .Marland Kitchen.. 1 once daily 2)  Ventolin Hfa 108 (90 Base) Mcg/act Aers (Albuterol sulfate) .... 60 metered dose ihaler, two puffs qid prn 3)  Rhinocort Aqua 32 Mcg/act Susp (Budesonide (nasal)) .... As  needed 4)  Lisinopril-hydrochlorothiazide 10-12.5 Mg Tabs (Lisinopril-hydrochlorothiazide) .Marland Kitchen.. 1 once daily 5)  Lovastatin 40 Mg Tabs (Lovastatin) .Marland Kitchen.. 1 by mouth qpm for lipid management 6)  Zyrtec-d Allergy & Congestion 5-120 Mg Xr12h-tab (Cetirizine-pseudoephedrine) .... Take 1 tablet by mouth once a day 7)  Ibuprofen 800 Mg Tabs (Ibuprofen) .... Take 1 tablet by mouth two times a day 8)  Zolpidem Tartrate 5 Mg Tabs (Zolpidem tartrate) .Marland Kitchen.. 1 by mouth at bedtime 9)  Butalbital-apap-caffeine 50-325-40 Mg Caps (Butalbital-apap-caffeine) .Marland KitchenMarland KitchenMarland Kitchen  1 by mouth as needed for migraine 10)  Multivitamins Tabs (Multiple vitamin) .Marland Kitchen.. 1 by mouth once daily 11)  Analpram-hc 1-2.5 % Crea (Hydrocortisone ace-pramoxine) .... Apply as needed 12)  Fluocinonide 0.05 % Crea (Fluocinonide) 13)  Singulair 10 Mg Tabs (Montelukast sodium) .Marland Kitchen.. 1 by mouth once daily 14)  Flexeril 10 Mg Tabs (Cyclobenzaprine hcl) .... Take 1/2 tab twice daily as needed 15)  Bentyl 10 Mg Caps (Dicyclomine hcl) .... Take 1 tab twice daily as needed for cramps and spasms  Patient: [name] Note: All result statuses are Final unless otherwise noted.  Tests: (1) Lipid Panel (LIPID)   Cholesterol          [H]  248 mg/dL                   0-454     ATP III Classification            Desirable:  < 200 mg/dL                    Borderline High:  200 - 239 mg/dL               High:  > = 240 mg/dL   Triglycerides             89.0 mg/dL                  0.9-811.9     Normal:  <150 mg/dL     Borderline High:  147 - 199 mg/dL   HDL                       82.95 mg/dL                 >62.13   VLDL Cholesterol          17.8 mg/dL                  0.8-65.7  CHO/HDL Ratio:  CHD Risk                             4                    Men          Women     1/2 Average Risk     3.4          3.3     Average Risk          5.0          4.4     2X Average Risk          9.6          7.1     3X Average Risk          15.0          11.0                            Tests: (2) BMP (METABOL)   Sodium               [L]  134 mEq/L                   135-145   Potassium                 4.2  mEq/L                   3.5-5.1   Chloride                  105 mEq/L                   96-112   Carbon Dioxide            26 mEq/L                    19-32   Glucose              [H]  133 mg/dL                   16-10   BUN                       16 mg/dL                    9-60   Creatinine                0.9 mg/dL                   4.5-4.0   Calcium                   9.6 mg/dL                   9.8-11.9   GFR                       73.30 mL/min                >60  Tests: (3) CBC Platelet w/Diff (CBCD)   Fitz Matsuo Cell Count          5.6 K/uL                    4.5-10.5   Red Cell Count            4.83 Mil/uL                 3.87-5.11   Hemoglobin                12.3 g/dL                   14.7-82.9   Hematocrit                37.2 %                      36.0-46.0   MCV                  [L]  77.2 fl                     78.0-100.0   MCHC                      33.1 g/dL                   56.2-13.0   RDW                  [H]  17.7 %  11.5-14.6   Platelet Count            216.0 K/uL                  150.0-400.0   Neutrophil %              60.5 %                      43.0-77.0   Lymphocyte %              26.6 %                      12.0-46.0   Monocyte %                8.5 %                       3.0-12.0   Eosinophils%              3.8 %                       0.0-5.0   Basophils %               0.6 %                       0.0-3.0   Neutrophill Absolute      3.4 K/uL                    1.4-7.7   Lymphocyte Absolute       1.5 K/uL                    0.7-4.0   Monocyte Absolute         0.5 K/uL                    0.1-1.0  Eosinophils, Absolute                             0.2 K/uL                    0.0-0.7   Basophils Absolute        0.0 K/uL                    0.0-0.1  Tests: (4) Hepatic/Liver Function Panel (HEPATIC)   Total Bilirubin            0.7 mg/dL                   4.5-4.0   Direct Bilirubin          0.1 mg/dL                   9.8-1.1   Alkaline Phosphatase      48 U/L                      39-117   AST                       23 U/L                      0-37   ALT  25 U/L                      0-35   Total Protein             6.7 g/dL                    0.4-5.4   Albumin                   4.3 g/dL                    0.9-8.1  Tests: (5) TSH (TSH)   FastTSH                   1.86 uIU/mL                 0.35-5.50  Tests: (6) UDip Only (UDIP)   Color                     LT. YELLOW       RANGE:  Yellow;Lt. Yellow   Clarity                   CLEAR                       Clear   Specific Gravity          1.025                       1.000 - 1.030   Urine Ph                  6.0                         5.0-8.0   Protein                   NEGATIVE                    Negative   Urine Glucose             NEGATIVE                    Negative   Ketones                   NEGATIVE                    Negative   Urine Bilirubin           NEGATIVE                    Negative   Blood                     NEGATIVE                    Negative   Urobilinogen              0.2                         0.0 - 1.0   Leukocyte Esterace        NEGATIVE                    Negative  Nitrite                   NEGATIVE                    Negative  Tests: (7) Cholesterol LDL - Direct (DIRLDL)  Cholesterol LDL - Direct                             172.6 mg/dL     Optimal:  <914 mg/dL     Near or Above Optimal:  100-129 mg/dL     Borderline High:  782-956 mg/dL     High:  213-086 mg/dL     Very High:  >578 mg/dLPrescriptions: ANALPRAM-HC 1-2.5 %  CREA (HYDROCORTISONE ACE-PRAMOXINE) apply as needed  #60 x 3   Entered and Authorized by:   Jacques Navy MD   Signed by:   Jacques Navy MD on 09/05/2010   Method used:   Print then Give to Patient   RxID:   [id number] FLUOCINONIDE 0.05 % CREA (FLUOCINONIDE)   #60 x 3    Entered and Authorized by:   Jacques Navy MD   Signed by:   Jacques Navy MD on 09/05/2010   Method used:   Print then Give to Patient   RxID:   [id number] LOVASTATIN 40 MG TABS (LOVASTATIN) 1 by mouth qPM for lipid management  #30 x 12   Entered and Authorized by:   Jacques Navy MD   Signed by:   Jacques Navy MD on 09/05/2010   Method used:   Print then Give to Patient   RxID:   [id number] RHINOCORT AQUA 32 MCG/ACT  SUSP (BUDESONIDE (NASAL)) as needed  #3 x 3   Entered and Authorized by:   Jacques Navy MD   Signed by:   Jacques Navy MD on 09/05/2010   Method used:   Print then Give to Patient   RxID:   [id number] SINGULAIR 10 MG TABS (MONTELUKAST SODIUM) 1 by mouth once daily  #30 x 12   Entered and Authorized by:   Jacques Navy MD   Signed by:   Jacques Navy MD on 09/05/2010   Method used:   Print then Give to Patient   RxID:   [id number]    Orders Added: 1)  Est. Patient Level IV [06237]

## 2010-11-05 NOTE — Assessment & Plan Note (Signed)
Summary: Abdominal pain/LRH    History of Present Illness Visit Type: Follow-up Visit Primary GI MD: Stan Head MD [facility] Primary Provider: Cristal Deer Chief Complaint: Pain in LUQ x 1 year on and off  radiates to back  History of Present Illness:   Patient is a 45 year old female followed by Dr. Leone Payor for GERD, constipation predominant IBS and left sided abdominal pain. Patient last seen at time of colonoscopy in June 2010. She is here today with several gastrointestinal issures. First, she gives a several day history of nausea and recurrent left sided abdominal pain (this time it is LUQ).  Pain wakes her up at night, it is constant but gets worse at various times for unclear reason. Patient initially thought her symptoms were secondary to constipation but had BM yesterday without any significant improvment. Local heat doesn't really work.  She has chronic back problems and wonders about radicular type pain. No urinary symptoms. No fevers.  Patient has chronic constipation, worse around menstrual cycle. She has tried Miralax without success. She tried  Amitiza but after just one dose she developed significant diarrhea and discontinued the medicaiton. She tried She takes Dulcolax as needed.   Having alot problems with reflux over the last few months.    GI Review of Systems    Reports abdominal pain, bloating, and  nausea.     Location of  Abdominal pain: LUQ.    Denies acid reflux, belching, chest pain, dysphagia with liquids, dysphagia with solids, heartburn, loss of appetite, vomiting, vomiting blood, weight loss, and  weight gain.        Denies anal fissure, black tarry stools, change in bowel habit, constipation, diarrhea, diverticulosis, fecal incontinence, heme positive stool, hemorrhoids, irritable bowel syndrome, jaundice, light color stool, liver problems, rectal bleeding, and  rectal pain.    Current Medications (verified): 1)  Zegerid 40-1100 Mg Caps (Omeprazole-Sodium  Bicarbonate) .Marland Kitchen.. 1 Once Daily 2)  Ventolin Hfa 108 (90 Base) Mcg/act  Aers (Albuterol Sulfate) .... 60 Metered Dose Ihaler, Two Puffs Qid Prn 3)  Rhinocort Aqua 32 Mcg/act  Susp (Budesonide (Nasal)) .... As Needed 4)  Lisinopril-Hydrochlorothiazide 10-12.5 Mg  Tabs (Lisinopril-Hydrochlorothiazide) .Marland Kitchen.. 1 Once Daily 5)  Lovastatin 40 Mg Tabs (Lovastatin) .Marland Kitchen.. 1 By Mouth Qpm For Lipid Management 6)  Zyrtec-D Allergy & Congestion 5-120 Mg Xr12h-Tab (Cetirizine-Pseudoephedrine) .... Take 1 Tablet By Mouth Once A Day 7)  Ibuprofen 800 Mg Tabs (Ibuprofen) .... Take 1 Tablet By Mouth Two Times A Day 8)  Zolpidem Tartrate 5 Mg Tabs (Zolpidem Tartrate) .Marland Kitchen.. 1 By Mouth At Bedtime 9)  Butalbital-Apap-Caffeine 50-325-40 Mg Caps (Butalbital-Apap-Caffeine) .Marland Kitchen.. 1 By Mouth As Needed For Migraine 10)  Multivitamins   Tabs (Multiple Vitamin) .Marland Kitchen.. 1 By Mouth Once Daily 11)  Analpram-Hc 1-2.5 %  Crea (Hydrocortisone Ace-Pramoxine) .... Apply As Needed 12)  Fluocinonide 0.05 % Crea (Fluocinonide) 13)  Singulair 10 Mg Tabs (Montelukast Sodium) .Marland Kitchen.. 1 By Mouth Once Daily 14)  Flexeril 10 Mg Tabs (Cyclobenzaprine Hcl) .... Take 1/2 Tab Twice Daily As Needed 15)  Bentyl 10 Mg Caps (Dicyclomine Hcl) .... Take 1 Tab Twice Daily As Needed For Cramps and Spasms  Allergies (verified): 1)  ! Pcn  Past History:  Past Medical History: Reviewed history from 02/06/2009 and no changes required. CONSTIPATION-PREDOMIANT IBS LOW BACK PAIN (ICD-724.2) HEADACHE (ICD-784.0) ALLERGIC RHINITIS (ICD-477.9) HYPERTENSION (ICD-401.9) HYPERLIPIDEMIA (ICD-272.4) GERD (ICD-530.81) ASTHMA (ICD-493.90)   Physician Roster:          Allergy - weyland ([facility])  Lauretta Grill for [facility]          ENT- Narda Bonds          OrthoOphelia Charter          GILeone Payor  Past Surgical History: Reviewed history from 02/06/2009 and no changes required. Knee Surgery-right '08, '01, '96, '92 Knee surgery-left 03/05/1987 sinus -polyps 1994-03-04 Lumbar fusion:  L4-5S1 fusion '05 G2P2- second by C-section Tubal Ligation  Family History: Reviewed history from 10/08/2007 and no changes required. father-died 2065/03/04, pulmonary fibrosis; CAD;DM; Lipids; HTN mother - 03/04/2038; HTN 2 sisters - both with osteopenia, older with GI problems s/p colectomy Neg- breast, colon cancer  Social History: Reviewed history from 02/06/2009 and no changes required. 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 Work is uncertain for him ( Jan '09) Patient has never smoked.  Alcohol Use - no Daily Caffeine Use Illicit Drug Use - no  Review of Systems  The patient denies allergy/sinus, anemia, anxiety-new, arthritis/joint pain, back pain, blood in urine, breast changes/lumps, change in vision, confusion, cough, coughing up blood, depression-new, fainting, fatigue, fever, headaches-new, hearing problems, heart murmur, heart rhythm changes, itching, menstrual pain, muscle pains/cramps, night sweats, nosebleeds, pregnancy symptoms, shortness of breath, skin rash, sleeping problems, sore throat, swelling of feet/legs, swollen lymph glands, thirst - excessive , urination - excessive , urination changes/pain, urine leakage, vision changes, and voice change.    Vital Signs:  Patient profile:   45 year old female Height:      63 inches Weight:      144 pounds BMI:     25.60 Pulse rate:   60 / minute Pulse rhythm:   regular BP sitting:   118 / 70  (left arm)  Vitals Entered By: Milford Cage NCMA (September 06, 2010 1:09 PM)  Physical Exam  General:  Well developed, well nourished, no acute distress. Head:  Normocephalic and atraumatic. Eyes:  Conjunctiva pink, no icterus.  Neck:  no obvious masses  Lungs:  Clear throughout to auscultation. Heart:  Regular rate and rhythm; no murmurs, rubs,  or bruits. Abdomen:  Abdomen soft, nondistended. Mild LUQ tenderness. No obvious masses or hepatomegaly.Normal bowel  sounds.  Msk:  Symmetrical with no gross deformities. Normal posture. Extremities:  No palmar erythema, no edema.  Neurologic:  Alert and  oriented x4;  grossly normal neurologically. Skin:  Intact without significant lesions or rashes. Cervical Nodes:  No significant cervical adenopathy. Psych:  Alert and cooperative. Normal mood and affect.   Impression & Recommendations:  Problem # 1:  LUQ PAIN (ICD-789.02) Assessment Deteriorated Patient wonders about a muscle spasm. Pain could be musculoskeletal in nature but her intermittent nausea doesn't fit the picture. Her abdominal exam is not concerning. Her weight is stable. Will try low dose Flexeril to see if it makes any difference. If no improvement after a couple of days she will stop the medication and try Bentyl for a couple of days.  If no improvement after that, patient will likely need upper endoscopy. She will call us with condition update in a week or so.   Problem # 2:  IBS- CONSTIPATION PREDOMINANT (ICD-564.1) Assessment: Deteriorated She had recurrent constipation a couple of days ago. Patient agrees to [employer], we taked about customizing her dose. She had a normal colonoscopy June. 2010.   Patient Instructions: 1)  We have given you written prescriptions for Bentyl and Flexeril to  take to your pharmacy. 2)  Take 1/2 capful of Miralax 2-3 times a  3)  week as tolerated. 4)  Call us next week if the items above do not help. 5)  We made you a follow up appointment with Dr. Leone Payor for 10-02-2010. Appointment card provided. 6)  GI Reflux brochure given.  7)  The medication list was reviewed and reconciled.  All changed / newly prescribed medications were explained.  A complete medication list was provided to the patient / caregiver. Prescriptions: BENTYL 10 MG CAPS (DICYCLOMINE HCL) Take 1 tab twice daily as needed for cramps and spasms  #60 x 1   Entered by:   Lowry Ram NCMA   Authorized by:   Willette Cluster NP    Signed by:   Lowry Ram NCMA on 09/06/2010   Method used:   Print then Give to Patient   RxID:   [id number] FLEXERIL 10 MG TABS (CYCLOBENZAPRINE HCL) Take 1/2 tab twice daily as needed  #30 x 0   Entered by:   Lowry Ram NCMA   Authorized by:   Willette Cluster NP   Signed by:   Lowry Ram NCMA on 09/06/2010   Method used:   Print then Give to Patient   RxID:   [id number]

## 2010-11-05 NOTE — Progress Notes (Signed)
Summary: Triage   Phone Note Call from Patient Call back at Home Phone [phone]   Caller: Patient Call For: Dr. Leone Payor Reason for Call: Talk to Nurse Summary of Call: RLQ pain, acid reflux....requesting sooner appt. than next avail. Initial call taken by: Karna Christmas,  September 05, 2010 3:05 PM  Follow-up for Phone Call        Left message for patient to call back Selinda Michaels, RN  Spoke with patient and gave her an appointment with Willette Cluster, RNP for today at 1:30pm. Chart ordered and Pam aware. Follow-up by: Selinda Michaels RN,  September 06, 2010 9:50 AM

## 2010-11-05 NOTE — Assessment & Plan Note (Signed)
Summary: ?resp inf per pt/#/cd   Vital Signs:  Patient profile:   45 year old female Height:      63 inches Weight:      152.50 pounds BMI:     27.11 O2 Sat:      98 % on Room air Temp:     98.8 degrees F oral Pulse rate:   102 / minute BP sitting:   120 / 80  (left arm) Cuff size:   regular  Vitals Entered ByZella Ball Ewing (December 05, 2009 2:45 PM)  O2 Flow:  Room air CC: chest and sinus congestion/RE   Primary Care Provider:  Micheal Norins,MD  CC:  chest and sinus congestion/RE.  History of Present Illness: here with 3 days onset febrile illness with fever, ST, headache, fatigue, and sinus congestion with greenish d/c; seen at urgent care Mon previous and neg for influenza, now with worsening wheezing and mild sob/doe with slight increased cough.  Pt denies other CP, orthopnea, pnd, worsening LE edema, palps, dizziness or syncope .  Pt denies new neuro symptoms such as headache, facial or extremity weakness   Wheezing worse at night, did not sleep much last night.    Problems Prior to Update: 1)  Diarrhea  (ICD-787.91) 2)  Asthma, With Acute Exacerbation  (ICD-493.92) 3)  Hemorrhoids, Internal, With Intermittent Swelling, Bleeding  (ICD-455.2) 4)  Ibs- Constipation Predominant  (ICD-564.1) 5)  Low Back Pain  (ICD-724.2) 6)  Acute Maxillary Sinusitis  (ICD-461.0) 7)  Uri  (ICD-465.9) 8)  Cough  (ICD-786.2) 9)  Fasting Hyperglycemia  (ICD-790.29) 10)  Headache  (ICD-784.0) 11)  Allergic Rhinitis  (ICD-477.9) 12)  Hypertension  (ICD-401.9) 13)  Hyperlipidemia  (ICD-272.4) 14)  Gerd  (ICD-530.81) 15)  Asthma  (ICD-493.90)  Medications Prior to Update: 1)  Allgery Shots .... Twice Monthly 2)  Nexium 40 Mg Cpdr (Esomeprazole Magnesium) .Marland Kitchen.. 1 By Mouth Once Daily 3)  Ventolin Hfa 108 (90 Base) Mcg/act  Aers (Albuterol Sulfate) .... 60 Metered Dose Ihaler, Two Puffs Qid Prn 4)  Rhinocort Aqua 32 Mcg/act  Susp (Budesonide (Nasal)) .... As Needed 5)   Lisinopril-Hydrochlorothiazide 10-12.5 Mg  Tabs (Lisinopril-Hydrochlorothiazide) .Marland Kitchen.. 1 Once Daily 6)  Lovastatin 20 Mg  Tabs (Lovastatin) .... 2 By Mouth Q Pm 7)  Zyrtec-D Allergy & Congestion 5-120 Mg Xr12h-Tab (Cetirizine-Pseudoephedrine) .... Take 1 Tablet By Mouth Once A Day 8)  Ibuprofen 800 Mg Tabs (Ibuprofen) .... Take 1 Tablet By Mouth Two Times A Day 9)  Zolpidem Tartrate 5 Mg Tabs (Zolpidem Tartrate) .Marland Kitchen.. 1 By Mouth At Bedtime 10)  Pulmicort .... Two Puffs Daily 11)  Butalbital-Apap-Caffeine 50-325-40 Mg Caps (Butalbital-Apap-Caffeine) .Marland Kitchen.. 1 By Mouth As Needed For Migraine 12)  Ambien 10 Mg Tabs (Zolpidem Tartrate) .... 1/2 By Mouth Qhs As Needed 13)  Piroxicam 20 Mg Caps (Piroxicam) .Marland Kitchen.. 1 By Mouth Once Daily 14)  Multivitamins   Tabs (Multiple Vitamin) .Marland Kitchen.. 1 By Mouth Once Daily 15)  Amitiza 8 Mcg  Caps (Lubiprostone) .Marland Kitchen.. 1 Two Times A Day/take With Food and Water 16)  Analpram-Hc 1-2.5 %  Crea (Hydrocortisone Ace-Pramoxine) .... Apply As Needed 17)  Ceftin 250 Mg Tabs (Cefuroxime Axetil) .Marland Kitchen.. 1 By Mouth Two Times A Day 18)  Prednisone 10 Mg Tabs (Prednisone) .... 4po Qd For 3days, Then 3po Qd For 3days, Then 2po Qd For 3days, Then 1po Qd For 3 Days, Then Stop 19)  Tussionex Pennkinetic Er 8-10 Mg/69ml Lqcr (Chlorpheniramine-Hydrocodone) .Marland Kitchen.. 1 Tsp By Mouth Two Times A Day  As Needed 20)  Flexeril 5 Mg Tabs (Cyclobenzaprine Hcl) .Marland Kitchen.. 1 Three Times A Day As Needed Muscle Spasm 21)  Vicodin 5-500 Mg Tabs (Hydrocodone-Acetaminophen) .Marland Kitchen.. 1 Q 6 As Needed Pain 22)  Vivotif Berna Vaccine  Cpdr (Typhoid Vaccine) .Marland Kitchen.. 1 Every Other Day X 4 Doses Complete 1 Wk Prior To Traveling 23)  Cipro 500 Mg Tabs (Ciprofloxacin Hcl) .Marland Kitchen.. 1 Two Times A Day 24)  Ciprofloxacin Hcl 500 Mg Tabs (Ciprofloxacin Hcl) .Marland Kitchen.. 1 By Mouth Two Times A Day 25)  Metronidazole 250 Mg Tabs (Metronidazole) .Marland Kitchen.. 1 By Mouth Qid X 10 Days  Current Medications (verified): 1)  Allgery Shots .... Twice Monthly 2)  Nexium 40  Mg Cpdr (Esomeprazole Magnesium) .Marland Kitchen.. 1 By Mouth Once Daily 3)  Ventolin Hfa 108 (90 Base) Mcg/act  Aers (Albuterol Sulfate) .... 60 Metered Dose Ihaler, Two Puffs Qid Prn 4)  Rhinocort Aqua 32 Mcg/act  Susp (Budesonide (Nasal)) .... As Needed 5)  Lisinopril-Hydrochlorothiazide 10-12.5 Mg  Tabs (Lisinopril-Hydrochlorothiazide) .Marland Kitchen.. 1 Once Daily 6)  Lovastatin 20 Mg  Tabs (Lovastatin) .... 2 By Mouth Q Pm 7)  Zyrtec-D Allergy & Congestion 5-120 Mg Xr12h-Tab (Cetirizine-Pseudoephedrine) .... Take 1 Tablet By Mouth Once A Day 8)  Ibuprofen 800 Mg Tabs (Ibuprofen) .... Take 1 Tablet By Mouth Two Times A Day 9)  Zolpidem Tartrate 5 Mg Tabs (Zolpidem Tartrate) .Marland Kitchen.. 1 By Mouth At Bedtime 10)  Pulmicort .... Two Puffs Daily 11)  Butalbital-Apap-Caffeine 50-325-40 Mg Caps (Butalbital-Apap-Caffeine) .Marland Kitchen.. 1 By Mouth As Needed For Migraine 12)  Ambien 10 Mg Tabs (Zolpidem Tartrate) .... 1/2 By Mouth Qhs As Needed 13)  Piroxicam 20 Mg Caps (Piroxicam) .Marland Kitchen.. 1 By Mouth Once Daily 14)  Multivitamins   Tabs (Multiple Vitamin) .Marland Kitchen.. 1 By Mouth Once Daily 15)  Amitiza 8 Mcg  Caps (Lubiprostone) .Marland Kitchen.. 1 Two Times A Day/take With Food and Water 16)  Analpram-Hc 1-2.5 %  Crea (Hydrocortisone Ace-Pramoxine) .... Apply As Needed 17)  Ceftin 250 Mg Tabs (Cefuroxime Axetil) .Marland Kitchen.. 1 By Mouth Two Times A Day 18)  Prednisone 10 Mg Tabs (Prednisone) .... 4po Qd For 3days, Then 3po Qd For 3days, Then 2po Qd For 3days, Then 1po Qd For 3 Days, Then Stop 19)  Tussionex Pennkinetic Er 8-10 Mg/59ml Lqcr (Chlorpheniramine-Hydrocodone) .Marland Kitchen.. 1 Tsp By Mouth Two Times A Day As Needed 20)  Flexeril 5 Mg Tabs (Cyclobenzaprine Hcl) .Marland Kitchen.. 1 Three Times A Day As Needed Muscle Spasm 21)  Vicodin 5-500 Mg Tabs (Hydrocodone-Acetaminophen) .Marland Kitchen.. 1 Q 6 As Needed Pain 22)  Vivotif Berna Vaccine  Cpdr (Typhoid Vaccine) .Marland Kitchen.. 1 Every Other Day X 4 Doses Complete 1 Wk Prior To Traveling 23)  Doxycycline Hyclate 100 Mg Caps (Doxycycline Hyclate) .Marland Kitchen.. 1po  Two Times A Day 24)  Tessalon Perles 100 Mg Caps (Benzonatate) .Marland Kitchen.. 1 -2 By Mouth Three Times A Day As Needed 25)  Prednisone 10 Mg Tabs (Prednisone) .... 3po Qd For 3days, Then 2po Qd For 3days, Then 1po Qd For 3days, Then Stop  Allergies (verified): 1)  ! Pcn  Past History:  Past Medical History: Last updated: 02/06/2009 CONSTIPATION-PREDOMIANT IBS LOW BACK PAIN (ICD-724.2) HEADACHE (ICD-784.0) ALLERGIC RHINITIS (ICD-477.9) HYPERTENSION (ICD-401.9) HYPERLIPIDEMIA (ICD-272.4) GERD (ICD-530.81) ASTHMA (ICD-493.90)   Physician Roster:          Allergy - weyland ([facility])          Gyn Earlene Plater for [facility]          ENT- Narda Bonds  OrthoOphelia Charter          Brennan Bailey  Past Surgical History: Last updated: 02/06/2009 Knee Surgery-right '08, '01, '96, '92 Knee surgery-left '88 sinus -polyps '95 Lumbar fusion: L4-5S1 fusion '05 G2P2- second by C-section Tubal Ligation  Social History: Last updated: 02/06/2009 [employer] married '96 1 son '98, 1 daughter '08 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 Work is uncertain for him ( Jan '09) Patient has never smoked.  Alcohol Use - no Daily Caffeine Use Illicit Drug Use - no  Risk Factors: Exercise: yes (10/08/2007)  Risk Factors: Smoking Status: never (02/06/2009) Passive Smoke Exposure: yes (06/27/2008)  Review of Systems       all otherwise negative per pt   Physical Exam  General:  alert and well-developed.  , mild ill  Head:  normocephalic and atraumatic.   Eyes:  vision grossly intact, pupils equal, and pupils round.   Ears:  bilat tm's red, sinus nontender Nose:  nasal dischargemucosal pallor and mucosal edema.   Mouth:  pharyngeal erythema and fair dentition.   Neck:  supple and cervical lymphadenopathy.   Lungs:  normal respiratory effort, R decreased breath sounds, R wheezes, L decreased breath sounds, and L wheezes.   Heart:  normal rate and regular rhythm.   Extremities:  no edema, no  erythema    Impression & Recommendations:  Problem # 1:  BRONCHITIS-ACUTE (ICD-466.0)  The following medications were removed from the medication list:    Ciprofloxacin Hcl 500 Mg Tabs (Ciprofloxacin hcl) .Marland Kitchen... 1 by mouth two times a day    Metronidazole 250 Mg Tabs (Metronidazole) .Marland Kitchen... 1 by mouth qid x 10 days Her updated medication list for this problem includes:    Ventolin Hfa 108 (90 Base) Mcg/act Aers (Albuterol sulfate) .Marland KitchenMarland KitchenMarland KitchenMarland Kitchen 60 metered dose ihaler, two puffs qid prn    Zyrtec-d Allergy & Congestion 5-120 Mg Xr12h-tab (Cetirizine-pseudoephedrine) .Marland Kitchen... Take 1 tablet by mouth once a day    Ceftin 250 Mg Tabs (Cefuroxime axetil) .Marland Kitchen... 1 by mouth two times a day    Tussionex Pennkinetic Er 8-10 Mg/88ml Lqcr (Chlorpheniramine-hydrocodone) .Marland Kitchen... 1 tsp by mouth two times a day as needed    Doxycycline Hyclate 100 Mg Caps (Doxycycline hyclate) .Marland Kitchen... 1po two times a day    Tessalon Perles 100 Mg Caps (Benzonatate) .Marland Kitchen... 1 -2 by mouth three times a day as needed treat as above, f/u any worsening signs or symptoms   Problem # 2:  WHEEZING (ICD-786.07) for depo shot today, for prednisone asd   Problem # 3:  HYPERTENSION (ICD-401.9)  Her updated medication list for this problem includes:    Lisinopril-hydrochlorothiazide 10-12.5 Mg Tabs (Lisinopril-hydrochlorothiazide) .Marland Kitchen... 1 once daily  BP today: 120/80 Prior BP: 100/70 (05/25/2009)  Labs Reviewed: K+: 3.8 (10/05/2008) Creat: : 0.8 (10/05/2008)   Chol: 216 (10/05/2008)   HDL: 52.3 (10/05/2008)   LDL: DEL (10/05/2008)   TG: 141 (10/05/2008) stable overall by hx and exam, ok to continue meds/tx as is   Complete Medication List: 1)  Allgery Shots  .... Twice monthly 2)  Nexium 40 Mg Cpdr (Esomeprazole magnesium) .Marland Kitchen.. 1 by mouth once daily 3)  Ventolin Hfa 108 (90 Base) Mcg/act Aers (Albuterol sulfate) .... 60 metered dose ihaler, two puffs qid prn 4)  Rhinocort Aqua 32 Mcg/act Susp (Budesonide (nasal)) .... As needed 5)   Lisinopril-hydrochlorothiazide 10-12.5 Mg Tabs (Lisinopril-hydrochlorothiazide) .Marland Kitchen.. 1 once daily 6)  Lovastatin 20 Mg Tabs (Lovastatin) .... 2 by mouth q pm  7)  Zyrtec-d Allergy & Congestion 5-120 Mg Xr12h-tab (Cetirizine-pseudoephedrine) .... Take 1 tablet by mouth once a day 8)  Ibuprofen 800 Mg Tabs (Ibuprofen) .... Take 1 tablet by mouth two times a day 9)  Zolpidem Tartrate 5 Mg Tabs (Zolpidem tartrate) .Marland Kitchen.. 1 by mouth at bedtime 10)  Pulmicort  .... Two puffs daily 11)  Butalbital-apap-caffeine 50-325-40 Mg Caps (Butalbital-apap-caffeine) .Marland Kitchen.. 1 by mouth as needed for migraine 12)  Ambien 10 Mg Tabs (Zolpidem tartrate) .... 1/2 by mouth qhs as needed 13)  Piroxicam 20 Mg Caps (Piroxicam) .Marland Kitchen.. 1 by mouth once daily 14)  Multivitamins Tabs (Multiple vitamin) .Marland Kitchen.. 1 by mouth once daily 15)  Amitiza 8 Mcg Caps (Lubiprostone) .Marland Kitchen.. 1 two times a day/take with food and water 16)  Analpram-hc 1-2.5 % Crea (Hydrocortisone ace-pramoxine) .... Apply as needed 17)  Ceftin 250 Mg Tabs (Cefuroxime axetil) .Marland Kitchen.. 1 by mouth two times a day 18)  Prednisone 10 Mg Tabs (Prednisone) .... 4po qd for 3days, then 3po qd for 3days, then 2po qd for 3days, then 1po qd for 3 days, then stop 19)  Tussionex Pennkinetic Er 8-10 Mg/79ml Lqcr (Chlorpheniramine-hydrocodone) .Marland Kitchen.. 1 tsp by mouth two times a day as needed 20)  Flexeril 5 Mg Tabs (Cyclobenzaprine hcl) .Marland Kitchen.. 1 three times a day as needed muscle spasm 21)  Vicodin 5-500 Mg Tabs (Hydrocodone-acetaminophen) .Marland Kitchen.. 1 q 6 as needed pain 22)  Vivotif Berna Vaccine Cpdr (Typhoid vaccine) .Marland Kitchen.. 1 every other day x 4 doses complete 1 wk prior to traveling 23)  Doxycycline Hyclate 100 Mg Caps (Doxycycline hyclate) .Marland Kitchen.. 1po two times a day 24)  Tessalon Perles 100 Mg Caps (Benzonatate) .Marland Kitchen.. 1 -2 by mouth three times a day as needed 25)  Prednisone 10 Mg Tabs (Prednisone) .... 3po qd for 3days, then 2po qd for 3days, then 1po qd for 3days, then stop  Other Orders: Depo-  Medrol 40mg  (J1030) Depo- Medrol 80mg  (J1040) Admin of Therapeutic Inj  intramuscular or subcutaneous (16109)  Patient Instructions: 1)  you had the steroid shot today 2)  Please take all new medications as prescribed 3)  Continue all previous medications as before this visit  4)  Please schedule an appointment with your primary doctor as needed  Prescriptions: PREDNISONE 10 MG TABS (PREDNISONE) 3po qd for 3days, then 2po qd for 3days, then 1po qd for 3days, then stop  #18 x 1   Entered and Authorized by:   Corwin Levins MD   Signed by:   Corwin Levins MD on 12/05/2009   Method used:   Print then Give to Patient   RxID:   [id number] TESSALON PERLES 100 MG CAPS (BENZONATATE) 1 -2 by mouth three times a day as needed  #60 x 1   Entered and Authorized by:   Corwin Levins MD   Signed by:   Corwin Levins MD on 12/05/2009   Method used:   Print then Give to Patient   RxID:   [id number] DOXYCYCLINE HYCLATE 100 MG CAPS (DOXYCYCLINE HYCLATE) 1po two times a day  #20 x 0   Entered and Authorized by:   Corwin Levins MD   Signed by:   Corwin Levins MD on 12/05/2009   Method used:   Print then Give to Patient   RxID:   [id number]    Medication Administration  Injection # 1:    Medication: Depo- Medrol 40mg     Diagnosis: ASTHMA, WITH ACUTE EXACERBATION (ICD-493.92)    Route:  IM    Site: LUOQ gluteus    Exp Date: 07/2010    Lot #: [id number]    Mfr: Teva    Given by: Zella Ball Ewing (December 05, 2009 4:28 PM)  Injection # 2:    Medication: Depo- Medrol 80mg     Diagnosis: ASTHMA, WITH ACUTE EXACERBATION (ICD-493.92)    Route: IM    Site: LUOQ gluteus    Exp Date: 07/2010    Lot #: [id number]    Mfr: Teva    Given by: Zella Ball Ewing (December 05, 2009 4:28 PM)  Orders Added: 1)  Depo- Medrol 40mg  [J1030] 2)  Depo- Medrol 80mg  [J1040] 3)  Admin of Therapeutic Inj  intramuscular or subcutaneous [96372] 4)  Est. Patient Level IV [95621]

## 2010-11-05 NOTE — Assessment & Plan Note (Signed)
Summary: BRONCHITIS/FEVER--SIDE DOOR/CD   Vital Signs:  Patient profile:   45 year old female Weight:      143 pounds Temp:     98.4 degrees F oral Pulse rate:   96 / minute Pulse rhythm:   regular BP sitting:   118 / 72  (left arm)  Vitals Entered By: Lamar Sprinkles, CMA (August 05, 2010 9:38 AM) CC: Cough & Fever. Needs rf of fioricet and ventoin Comments Patient stopped amitiza due to diarrhea. She does not take piroxicam b/c ineffective.    Primary Care Provider:  Micheal Norins,MD  CC:  Cough & Fever. Needs rf of fioricet and ventoin.  History of Present Illness: [name] presents today with a week history of cough and congestion.  A week ago she began to feel congestion in her cough that was followed the next day by a dry cough.  Over the next day she began to have some production with her cough that was yellow/brown in color, but no frank blood noticed.  Over the next week she also developed a sore throat with post nasal drip, and low grade fevers of about 100.  She also complains of a headache and neck stiffness and states that she has been more fatigued than normal.  She denies any nasal drainage, chills, or muscle aches.  She tried taking Mucinex D but that didn't help at all and none of the other OTC medications have helped either.  She states that she is feeling worse in the fact that she is having a harder time breathing and her asthma is getting worse during this episode in that she has to use her Ventolin 4-5 times a day currently and it only provides temporary relief.  Of note her daughter is also sick with a similar course for illness and she has had her flu shot.    Current Medications (verified): 1)  Allgery Shots .... Twice Monthly 2)  Zegerid 40-1100 Mg Caps (Omeprazole-Sodium Bicarbonate) .Marland Kitchen.. 1 Once Daily 3)  Ventolin Hfa 108 (90 Base) Mcg/act  Aers (Albuterol Sulfate) .... 60 Metered Dose Ihaler, Two Puffs Qid Prn 4)  Rhinocort Aqua 32 Mcg/act  Susp (Budesonide  (Nasal)) .... As Needed 5)  Lisinopril-Hydrochlorothiazide 10-12.5 Mg  Tabs (Lisinopril-Hydrochlorothiazide) .Marland Kitchen.. 1 Once Daily 6)  Lovastatin 20 Mg  Tabs (Lovastatin) .... 2 By Mouth Q Pm 7)  Zyrtec-D Allergy & Congestion 5-120 Mg Xr12h-Tab (Cetirizine-Pseudoephedrine) .... Take 1 Tablet By Mouth Once A Day 8)  Ibuprofen 800 Mg Tabs (Ibuprofen) .... Take 1 Tablet By Mouth Two Times A Day 9)  Zolpidem Tartrate 5 Mg Tabs (Zolpidem Tartrate) .Marland Kitchen.. 1 By Mouth At Bedtime 10)  Pulmicort .... Two Puffs Daily 11)  Butalbital-Apap-Caffeine 50-325-40 Mg Caps (Butalbital-Apap-Caffeine) .Marland Kitchen.. 1 By Mouth As Needed For Migraine 12)  Piroxicam 20 Mg Caps (Piroxicam) .Marland Kitchen.. 1 By Mouth Once Daily 13)  Multivitamins   Tabs (Multiple Vitamin) .Marland Kitchen.. 1 By Mouth Once Daily 14)  Amitiza 8 Mcg  Caps (Lubiprostone) .Marland Kitchen.. 1 Two Times A Day/take With Food and Water 15)  Analpram-Hc 1-2.5 %  Crea (Hydrocortisone Ace-Pramoxine) .... Apply As Needed  Allergies (verified): 1)  ! Pcn  Past History:  Past Medical History: Last updated: 02/06/2009 CONSTIPATION-PREDOMIANT IBS LOW BACK PAIN (ICD-724.2) HEADACHE (ICD-784.0) ALLERGIC RHINITIS (ICD-477.9) HYPERTENSION (ICD-401.9) HYPERLIPIDEMIA (ICD-272.4) GERD (ICD-530.81) ASTHMA (ICD-493.90)   Physician Roster:          Allergy - weyland ([facility])          Gyn Earlene Plater for  OB          ENT- Narda Bonds          Ortho- Ophelia Charter          [employer]  Past Surgical History: Last updated: 02/06/2009 Knee Surgery-right '08, '01, '96, '92 Knee surgery-left Feb 21, 1987 sinus -polyps 02-20-94 Lumbar fusion: L4-5S1 fusion '05 G2P2- second by C-section Tubal Ligation  Family History: Last updated: 14-Oct-2007 father-died 02-20-65, pulmonary fibrosis; CAD;DM; Lipids; HTN mother - 2038-02-20; HTN 2 sisters - both with osteopenia, older with GI problems s/p colectomy Neg- breast, colon cancer  Social History: Last updated: 02/06/2009 [employer] married 1995/02/21 1 son Feb 20, 1997, 1 daughter '08 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 Work is uncertain for him ( Jan '09) Patient has never smoked.  Alcohol Use - no Daily Caffeine Use Illicit Drug Use - no  Risk Factors: Exercise: yes (14-Oct-2007)  Risk Factors: Smoking Status: never (02/06/2009) Passive Smoke Exposure: yes (06/27/2008)  Review of Systems       The patient complains of fever, dyspnea on exertion, prolonged cough, and headaches.  The patient denies anorexia, weight loss, weight gain, vision loss, decreased hearing, hoarseness, chest pain, syncope, hemoptysis, abdominal pain, melena, hematochezia, hematuria, incontinence, muscle weakness, suspicious skin lesions, unusual weight change, and enlarged lymph nodes.    Physical Exam  General:  alert, well-developed, well-nourished, and uncomfortable-appearing.   Head:  normocephalic and atraumatic.   Eyes:  pupils equal, pupils round, and pupils reactive to light.  C&S clear. Ears:  R ear normal and L ear normal.   Nose:  no external deformity, no external erythema, no nasal discharge, no mucosal edema, and no active bleeding or clots.   Mouth:  no exudates and pharyngeal erythema.   Neck:  supple, full ROM, and no cervical lymphadenopathy.   Lungs:  normal respiratory effort, normal breath sounds, no crackles. Had a few L base wheezes.   Heart:  normal rate, regular rhythm, no murmur, and no gallop.   Pulses:  R radial normal and L radial normal.   Neurologic:  alert & oriented X3, cranial nerves II-XII intact, and gait normal.   Skin:  turgor normal and color normal.   Cervical Nodes:  no anterior cervical adenopathy and no posterior cervical adenopathy.   Psych:  Oriented X3, memory intact for recent and remote, and good eye contact.     Impression & Recommendations:  Problem # 1:  BRONCHITIS, ACUTE (ICD-466.0) [name] has a history of bronchitis and severe asthma and she commonly will get these infections.  Due to the fact that she is producing a yellow  green sputum and the similarity of this instance to her previous bouts of bronchitis it seems that this is most likely the cause of her current illness.  Plan:  Prednisone 10mg , 2 tabs for 3 days and 1 tab for 6 days, to decrease the swellign and inflamation           Doxycycline 100mg  for 7 days           Refills on Ventolin fopr asthma and Butalbital for her migraines.  The following medications were removed from the medication list:    Ceftin 250 Mg Tabs (Cefuroxime axetil) .Marland Kitchen... 1 by mouth two times a day    Tussionex Pennkinetic Er 8-10 Mg/58ml Lqcr (Chlorpheniramine-hydrocodone) .Marland Kitchen... 1 tsp by mouth two times a day as needed    Doxycycline Hyclate 100 Mg Caps (Doxycycline hyclate) .Marland Kitchen... 1po two  times a day    Tessalon Perles 100 Mg Caps (Benzonatate) .Marland Kitchen... 1 -2 by mouth three times a day as needed Her updated medication list for this problem includes:    Ventolin Hfa 108 (90 Base) Mcg/act Aers (Albuterol sulfate) .Marland KitchenMarland KitchenMarland KitchenMarland Kitchen 60 metered dose ihaler, two puffs qid prn    Zyrtec-d Allergy & Congestion 5-120 Mg Xr12h-tab (Cetirizine-pseudoephedrine) .Marland Kitchen... Take 1 tablet by mouth once a day    Doxycycline Hyclate 100 Mg Caps (Doxycycline hyclate) .Marland Kitchen... 1 by mouth two times a day x 7 for bronchitis  Complete Medication List: 1)  Allgery Shots  .... Twice monthly 2)  Zegerid 40-1100 Mg Caps (Omeprazole-sodium bicarbonate) .Marland Kitchen.. 1 once daily 3)  Ventolin Hfa 108 (90 Base) Mcg/act Aers (Albuterol sulfate) .... 60 metered dose ihaler, two puffs qid prn 4)  Rhinocort Aqua 32 Mcg/act Susp (Budesonide (nasal)) .... As needed 5)  Lisinopril-hydrochlorothiazide 10-12.5 Mg Tabs (Lisinopril-hydrochlorothiazide) .Marland Kitchen.. 1 once daily 6)  Lovastatin 20 Mg Tabs (Lovastatin) .... 2 by mouth q pm 7)  Zyrtec-d Allergy & Congestion 5-120 Mg Xr12h-tab (Cetirizine-pseudoephedrine) .... Take 1 tablet by mouth once a day 8)  Ibuprofen 800 Mg Tabs (Ibuprofen) .... Take 1 tablet by mouth two times a day 9)  Zolpidem Tartrate 5  Mg Tabs (Zolpidem tartrate) .Marland Kitchen.. 1 by mouth at bedtime 10)  Pulmicort  .... Two puffs daily 11)  Butalbital-apap-caffeine 50-325-40 Mg Caps (Butalbital-apap-caffeine) .Marland Kitchen.. 1 by mouth as needed for migraine 12)  Piroxicam 20 Mg Caps (Piroxicam) .Marland Kitchen.. 1 by mouth once daily 13)  Multivitamins Tabs (Multiple vitamin) .Marland Kitchen.. 1 by mouth once daily 14)  Amitiza 8 Mcg Caps (Lubiprostone) .Marland Kitchen.. 1 two times a day/take with food and water 15)  Analpram-hc 1-2.5 % Crea (Hydrocortisone ace-pramoxine) .... Apply as needed 16)  Doxycycline Hyclate 100 Mg Caps (Doxycycline hyclate) .Marland Kitchen.. 1 by mouth two times a day x 7 for bronchitis 17)  Prednisone 10 Mg Tabs (Prednisone) .... 2 tab qd x3, 1 tab once daily x 6 Prescriptions: VENTOLIN HFA 108 (90 BASE) MCG/ACT  AERS (ALBUTEROL SULFATE) 60 METERED DOSE IHALER, two puffs qid prn  #1 x 3   Entered and Authorized by:   Jacques Navy MD   Signed by:   Jacques Navy MD on 08/05/2010   Method used:   Print then Give to Patient   RxID:   [id number] BUTALBITAL-APAP-CAFFEINE 50-325-40 MG CAPS (BUTALBITAL-APAP-CAFFEINE) 1 by mouth as needed for migraine  #30 x 3   Entered and Authorized by:   Jacques Navy MD   Signed by:   Jacques Navy MD on 08/05/2010   Method used:   Print then Give to Patient   RxID:   [id number] PREDNISONE 10 MG TABS (PREDNISONE) 2 tab qd x3, 1 tab once daily x 6  #12 x 0   Entered and Authorized by:   Jacques Navy MD   Signed by:   Jacques Navy MD on 08/05/2010   Method used:   Print then Give to Patient   RxID:   [id number] DOXYCYCLINE HYCLATE 100 MG CAPS (DOXYCYCLINE HYCLATE) 1 by mouth two times a day x 7 for bronchitis  #14 x 0   Entered and Authorized by:   Jacques Navy MD   Signed by:   Jacques Navy MD on 08/05/2010   Method used:   Print then Give to Patient   RxID:   [id number]    Orders Added: 1)  Est. Patient Level III [38756]

## 2010-11-05 NOTE — Letter (Signed)
[facility]   Imported By: Sherian Rein 07/27/2010 09:43:22  _____________________________________________________________________  External Attachment:    Type:   Image     Comment:   External Document

## 2010-11-05 NOTE — Letter (Signed)
[facility]   Imported By: Lennie Odor 12/05/2009 15:16:44  _____________________________________________________________________  External Attachment:    Type:   Image     Comment:   External Document

## 2010-11-07 NOTE — Progress Notes (Signed)
Summary: RX Flexeril   Phone Note Outgoing Call   Call placed by: Joselyn Glassman,  October 15, 2010 10:37 AM Call placed to: Patient Summary of Call: Called and left message for pt to call me.  I advised Gunnar Fusi will agree to refilling the skelaxin RX but she wants her to see Dr Debby Bud to follow up. I asked her if she wanted [place] to have a perscription at the front desk for her or we can send it to her pharmacy, let us know which one. Initial call taken by: Joselyn Glassman,  October 15, 2010 10:49 AM  Follow-up for Phone Call        The pt called me back and said she would like the perscription to go to [facility] for the Flexeril. She said she would make an appt with Dr Debby Bud and she has physical therapy scheduled. Follow-up by: Joselyn Glassman,  October 15, 2010 11:27 AM    Prescriptions: FLEXERIL 10 MG TABS (CYCLOBENZAPRINE HCL) Take 1/2 tab twice daily as needed  #30 x 0   Entered by:   Lowry Ram NCMA   Authorized by:   Willette Cluster NP   Signed by:   Lowry Ram NCMA on 10/15/2010   Method used:   Electronically to        [id number]* (retail)       [address]       [place], [place]  [address]       Ph: [phone]       Fax: [phone]   RxID:   [id number]

## 2010-11-08 ENCOUNTER — Telehealth: Payer: Self-pay | Admitting: Internal Medicine

## 2010-11-13 NOTE — Progress Notes (Signed)
Summary: RF - Flexeril  Phone Note Call from Patient   Summary of Call: Pt was given flexeril by GI - they determined it was more muscle than GI related. Patient is requesting refill of [facility]. Initial call taken by: Lamar Sprinkles, CMA,  November 08, 2010 2:12 PM  Follow-up for Phone Call        did rx and left msg for patient that Rx sent Follow-up by: Jacques Navy MD,  November 08, 2010 6:30 PM    Prescriptions: FLEXERIL 10 MG TABS (CYCLOBENZAPRINE HCL) Take 1/2 tab twice daily as needed  #60 x 3   Entered and Authorized by:   Jacques Navy MD   Signed by:   Jacques Navy MD on 11/08/2010   Method used:   Electronically to        [facility]  [phone]* (retail)       [address]       [place], [place]  [address]       Ph: [phone] or [phone]       Fax: [phone]   RxID:   [id number]

## 2010-11-25 ENCOUNTER — Telehealth: Payer: Self-pay | Admitting: Internal Medicine

## 2010-11-26 ENCOUNTER — Telehealth: Payer: Self-pay | Admitting: Internal Medicine

## 2010-11-27 ENCOUNTER — Encounter (INDEPENDENT_AMBULATORY_CARE_PROVIDER_SITE_OTHER): Payer: Self-pay | Admitting: *Deleted

## 2010-11-27 ENCOUNTER — Other Ambulatory Visit: Payer: Self-pay

## 2010-11-27 ENCOUNTER — Ambulatory Visit: Payer: Self-pay | Admitting: Internal Medicine

## 2010-11-27 DIAGNOSIS — R1114 Bilious vomiting: Secondary | ICD-10-CM

## 2010-11-30 ENCOUNTER — Encounter: Payer: Self-pay | Admitting: Internal Medicine

## 2010-12-01 ENCOUNTER — Telehealth: Payer: Self-pay | Admitting: Internal Medicine

## 2010-12-03 NOTE — Progress Notes (Signed)
Summary: REFILL - STOOL TESTING  Phone Note Call from Patient Call back at Home Phone [phone] Call back at [phone]   Summary of Call: See call report - Pt spoke w/oncall service regarding diarrhea. She also vomited on 2/18. She was feeling better yesterday. Today she says symptoms returned. She would like refill of phenergan and also wants to know if MD thinks this is a virus?  Initial call taken by: Lamar Sprinkles, CMA,  November 26, 2010 12:35 PM  Follow-up for Phone Call        ok for phenergan 12.5 mg 1 by mouth q 6, #30, refill x 2. Sounds like a virus.  Follow-up by: Jacques Navy MD,  November 26, 2010 6:19 PM  Additional Follow-up for Phone Call Additional follow up Details #1::        Pt informed, She is concerned about the diarrhea. Has had loose stools w/every BM since 2/10. In the past Dr Leone Payor gave her antibiotic for possible GI infection.  No blood or mucus but stools are frequent and large amounts w/strong odor. Should pt f/u w/GI OR have stool tested?  Additional Follow-up by: Lamar Sprinkles, CMA,  November 26, 2010 6:37 PM    Additional Follow-up for Phone Call Additional follow up Details #2::    should check for C.Diff. 787.0 Follow-up by: Jacques Navy MD,  November 27, 2010 12:52 PM  Additional Follow-up for Phone Call Additional follow up Details #3:: Details for Additional Follow-up Action Taken: Pt informed, labs in epic Additional Follow-up by: Lamar Sprinkles, CMA,  November 27, 2010 3:42 PM  New/Updated Medications: PROMETHAZINE HCL 12.5 MG TABS (PROMETHAZINE HCL) 1 every 6 hours as needed nausea Prescriptions: PROMETHAZINE HCL 12.5 MG TABS (PROMETHAZINE HCL) 1 every 6 hours as needed nausea  #30 x 2   Entered by:   Lamar Sprinkles, CMA   Authorized by:   Jacques Navy MD   Signed by:   Lamar Sprinkles, CMA on 11/26/2010   Method used:   Electronically to        [id number] (retail)       [address]   [place], [place]  [address]       Ph: [phone]       Fax: [phone]   RxID:   [id number]

## 2010-12-03 NOTE — Progress Notes (Signed)
Summary: Call Report  Phone Note Other Incoming   Caller: [facility] Summary of Call: [facility] Triage Call Report Triage Record Num: [id number] Operator: Patriciaann Clan Patient Name: [name] Call Date & Time: 11/24/2010 10:37:58AM Patient Phone: [phone] PCP: Illene Regulus Patient Gender: Female PCP Fax : [phone] Patient DOB: 1966-08-16 Practice Name: [facility] Reason for Call: LMP 11/18/10. Patient calling. States has had diarrhea, onset X 1 week. Patient states low grade fever per tactile. Declines to check temperature. States several loose stools/day. Patient states she developed vomiting, onset 11/23/10. Emesis X 2 11/23/10. C/o continuous nausea. States has tolerated clear fluids and toast 11/25/10. Urinating normally for patient. States severe, generalized headache X 30 minutes. Patient declines to be seen in ED. States she has hx of migraine headaches. Risk factors reviewed. Requests Rx to be called in for nausea/vomiting. Patient uses [facility] @ [phone]. 1047: Page placed to Dr.Katherine Tabori. 1118: Dr. Neena Rhymes notified. Orders received: Patient to go to ED/UC if headache is new or different that migraine headaches. May advise Excedrin or Aleve for pain. May call in Phenergan per standing order for nausea. Patient to call office 11/25/10 if nausea, diarrhea persist. Rx for Phenergan 25mg . p.o. q 4 hours prn for nausea/vomiting; dispense # 6, no refills called into [facility] @ [phone]. Spoke with Pharmacist/Leah 1131: Patient notified of above. Patient again advised to be seen in ED/UC if headache is new, severe, or different than her usual migraine headaches. Patient verbalizes understanding. Again declines to be seen in ED/UC. Care advice given per guidelines. Protocol(s) Used: Nausea or Vomiting Recommended Outcome per Protocol: See ED Immediately Override Outcome if Used in Protocol: Call  Provider Immediately RN Reason for Override Outcome: Per Caller Request. Reason for Outcome: New onset nausea/vomiting associated with stiff nec Initial call taken by: Margaret Pyle, CMA,  November 25, 2010 8:15 AM

## 2010-12-04 ENCOUNTER — Telehealth: Payer: Self-pay | Admitting: Internal Medicine

## 2010-12-04 ENCOUNTER — Encounter (INDEPENDENT_AMBULATORY_CARE_PROVIDER_SITE_OTHER): Payer: Self-pay | Admitting: *Deleted

## 2010-12-04 ENCOUNTER — Other Ambulatory Visit: Payer: PRIVATE HEALTH INSURANCE

## 2010-12-04 DIAGNOSIS — R197 Diarrhea, unspecified: Secondary | ICD-10-CM

## 2010-12-05 ENCOUNTER — Encounter: Payer: Self-pay | Admitting: Internal Medicine

## 2010-12-06 ENCOUNTER — Encounter: Payer: Self-pay | Admitting: Internal Medicine

## 2010-12-12 NOTE — Miscellaneous (Signed)
Summary: [facility]   Clinical Lists Changes  Observations: Added new observation of TD BOOSTER: Historical (12/04/2010 9:02) Added new observation of HEPBVAX#2: Historical (12/04/2010 9:00) Added new observation of FLU VAX: Historical (07/21/2010 9:03) Added new observation of HEPBVAX#1: Historical (02/28/2009 9:00) Added new observation of TD BOOSTER: Historical (01/19/2009 9:02)      Immunization History:  Hepatitis B Immunization History:    Hepatitis B # 1:  historical (02/28/2009)    Hepatitis B # 2:  historical (12/04/2010)  Tetanus/Td Immunization History:    Tetanus/Td:  historical (01/19/2009)    Tetanus/Td:  historical (12/04/2010)  Influenza Immunization History:    Influenza:  historical (07/21/2010)

## 2010-12-12 NOTE — Letter (Signed)
Summary: Health Exam/[employer] [facility] [employer]  Health Exam/[employer] [facility] [employer]   Imported By: Lester [place]ho[place] 12/06/2010 09:47:52  _____________________________________________________________________  External Attachment:    Type:   Image     Comment:   External Document

## 2010-12-12 NOTE — Progress Notes (Signed)
Summary: FORM NEEDED  Phone Note Call from Patient   Summary of Call: Pt dropped off physical form for employment. She needs this by Friday. Explained to pt that we need updated TB skin test and immunizations. Pt states that [facility] skin [facility] and immunizations. I noted on the form that pt will provide those documents b/c she did not want to drop them off here to be put in chart. 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  Initial call taken by: Lamar Sprinkles, CMA,  December 04, 2010 11:48 AM  Follow-up for Phone Call        form done. to triage Follow-up by: Jacques Navy MD,  December 04, 2010 5:21 PM

## 2010-12-12 NOTE — Progress Notes (Signed)
Summary: RESULTS  Phone Note Outgoing Call   Reason for Call: Discuss lab or test results Summary of Call: please call pt: stool was negative for c.diff. If still having diarrhea will need to send a second specimen.  Thanks Initial call taken by: Jacques Navy MD,  December 01, 2010 11:46 PM  Follow-up for Phone Call        lmovm for pt to call back/Ami Bullins CMA  December 02, 2010 11:48 AM   Additional Follow-up for Phone Call Additional follow up Details #1::        Pt informed, still having loose stools. Will repeat study.  Additional Follow-up by: Lamar Sprinkles, CMA,  December 03, 2010 5:21 PM

## 2011-02-16 ENCOUNTER — Other Ambulatory Visit: Payer: Self-pay | Admitting: Internal Medicine

## 2011-02-18 NOTE — Op Note (Signed)
[name], [name]               ACCOUNT NO.:  [id number]   MEDICAL RECORD NO.:  [id number]          PATIENT TYPE:  INP   LOCATION:  9374                          FACILITY:  [facility]   PHYSICIAN:  Gerri Spore B. Earlene Plater, M.D.  DATE OF BIRTH:  Jan 20, 1966   DATE OF PROCEDURE:  01/28/2007  DATE OF DISCHARGE:                               OPERATIVE REPORT   PREOPERATIVE DIAGNOSIS:  1. Chronic hypertension.  2. Probable superimposed pre-eclampsia - severe.  3. Insulin requiring gestational diabetes.  4. Severe back pain with history of back surgery requesting primary      cesarean section.  5. Desires tubal sterilization.   POSTOPERATIVE DIAGNOSIS:  1. Chronic hypertension.  2. Probable superimposed pre-eclampsia - severe.  3. Insulin requiring gestational diabetes.  4. Severe back pain with history of back surgery requesting primary      cesarean section.  5. Desires tubal sterilization.   PROCEDURE:  Primary low transverse cesarean section and bilateral tubal  ligation.   SURGEON:  Chester Holstein. Earlene Plater, M.D.   ASSISTANT:   SPECIMENS:  Placenta and portion of left and right tube submitted to  pathology.   ESTIMATED BLOOD LOSS:  900 mL.   COMPLICATIONS:  None.   INDICATIONS FOR PROCEDURE:  The patient presented to the office today at  approximately 36 weeks pregnancy for amniocentesis for fetal lung  maturity for a long history of chronic hypertension which has required  maximal doses of Aldomet and Procardia XL and has had multiple bouts of  elevated blood pressures as high as 177/100 and 180s to 110s on several  occasions.  She has had multiple 24 hour urine that were previously  normal and multiple sets of pre-eclampsia labs that were normal.  In  addition, she has gestational diabetes diagnosed at around 24 weeks  requiring insulin.  However, on the morning of her amnio, the patient  was complaining of not feeling well, dizzy with headache and visual  disturbances.  Blood pressure  in the office was 200/120, rechecked  188/110, 500 mg protein in the urine on office dip stick.  Therefore, I  recommended that we proceed with C-section regardless of fetal lung  maturity given the severity of her blood pressure and new symptoms.  Likely, this represents superimposed pre-eclampsia and a history of  chronic hypertension.  I discussed with the patient the small chance of  pulmonary immaturity given her diabetes and pre-term gestational age,  however, these effects should be mild if present and the risks of  continued pregnancy, in my mind, outweigh the risks of delivery.  The  patient was advised the risks of surgery.  The patient was advised of  the permanency of tubal ligation and ectopic rate and failure rate.   DESCRIPTION OF PROCEDURE:  The patient was taken to the operating room  and spinal anesthesia obtained.  She was prepped and draped in a  standard fashion and a Foley catheter was inserted into the bladder.  A  Pfannenstiel incision was made and carried sharply to the fascia.  The  fascia was divided sharply and elevated.  The  underlying rectus muscles  were dissected off sharply.  The posterior peritoneum was elevated and  entered sharply.  A bladder blade was inserted and bladder flap created  sharply.   The uterine incision was made in a low transverse fashion with the knife  and extended laterally with bandage scissors.  Amniotomy performed,  vertex elevated through the incision with fundal pressure, delivered  without difficulty.  Nose and mouth suctioned with the bulb.  The  remainder of the infant delivered without difficulty.  The cord was  clamped and cut and the infant handed off to the awaiting pediatricians.  1 gram of Ancef was given at cord clamp. Cord blood collected for the  patient's private cord banking at their request.  The placenta was  removed manually and submitted to pathology.  The uterus was cleared of  all clots and debris.  The  uterus was free of extension.  The uterine  incision was closed in a running locked fashion with 0 chromic.  A  second imbricating layer was placed with hemostasis obtained.   The left tube was identified and followed to its fimbriated end.  It was  grasped 2 cm distal to the cornua, 2 cm segment tied, and excised.  Hemostasis was obtained.  Repeated on the right side in the same manner.   The uterus was returned to the abdomen and the pelvis was irrigated.  All areas of dissection were inspected and were hemostatic.  The fascia  was closed with a running stitch of 0 Vicryl.  The subcutaneous tissues  were irrigated and made hemostatic with the Bovie.  The skin was closed  with staples.  The patient tolerated the procedure well and there were  no complications.  She was taken to the recovery room awake, alert, and  in stable condition.      Gerri Spore B. Earlene Plater, M.D.  Electronically Signed     WBD/MEDQ  D:  01/28/2007  T:  01/28/2007  Job:  [id number]

## 2011-02-21 NOTE — H&P (Signed)
[name], [name]               ACCOUNT NO.:  [id number]   MEDICAL RECORD NO.:  [id number]          PATIENT TYPE:  INP   LOCATION:  9156                          FACILITY:  [facility]   PHYSICIAN:  Lenoard Aden, M.D.DATE OF BIRTH:  12-Sep-1966   DATE OF ADMISSION:  01/10/2007  DATE OF DISCHARGE:                              HISTORY & PHYSICAL   CHIEF COMPLAINT:  Chronic hypertension with exacerbation, rule out pre-  eclampsia.  She is a 45 year old white female, G2, P1, who presents with  elevated blood pressures for further evaluation.   ALLERGIES:  SHE HAS NO KNOWN DRUG ALLERGIES.  HER ALLERGIES ARE TO  PENICILLIN.   MEDICATIONS:  Her medications are including:  Insulin, 48 units of Lantus in the morning and sliding scale as needed.  She takes Aldomet 500 mg p.o. t.i.d.  She is a non-smoker, nondrinker.  She denies domestic or physical violence.  Her medications are also to  include MiraLax, Colace, Nexium, folic acid and iron.   FAMILY HISTORY:  She has a family history of diabetes, heart disease,  myocardial infarction, chronic hypertension, ovarian cancer, pulmonary  fibrosis and migraines.   PAST MEDICAL HISTORY:  She has a personal medical history of back  fusion, sinus surgery, irritable bowel, hypertension, asthma, esophageal  reflux, migraine, chronic UTI.  She has previous knee surgery x4.  Pregnancy course complicated as noted by chronic hypertension with  normal 24-hour urines as recent as last week, normal PIH panels and  gestational diabetes, previously on glyburide, now on insulin as noted.  Most recent ultrasound as noted on December 31, 2006 revealed an estimated  fetal weight in the 46 percentile with a lighting abdominal  circumference and an AFI of 9.5.   PHYSICAL EXAMINATION:  GENERAL:  The patient is a well-developed, well-  nourished, white female in no acute distress.  VITAL SIGNS:  Her blood pressure is ranging from 170 to 191/98 to 106,  and she  received one dose of Procardia here in maternity admissions.  HEENT:  Normal.  NECK:  Supple, full range of motion.  No thyromegaly.  LUNGS:  Clear.  HEART:  Regular rhythm.  ABDOMEN:  Soft, gravid, nontender, no right upper quadrant tenderness  noted.  DTRs 2+ clonus, trace 1+ pre-tibial noted.  No CVA tenderness  noted.  Pelvic exam is deferred.  SKIN:  Intact.  NEUROLOGIC:  Nonfocal.  NST is reactive.  No contractures noted.   PIH status revealed uric acid of 3.6, hemoglobin 9.4, platelet count of  220, SGOT and PT 22/15, and creatinine is 0.69, LDH is 173.   IMPRESSION:  1. A 33-3/7 weeks' intrauterine pregnancy.  2. Chronic hypertension, questionable exacerbation versus superimposed      pre-eclampsia.  3. History of mildly lagging abdominal circumference.  4. Class A-II diabetes mellitus.  5. History of migraine headaches.  6. History of asthma with contraindication beta blocker.  7. PENICILLIN ALLERGY.   PLAN:  Admit for blood pressure control, increase Aldomet to 500 mg p.o.  q.i.d.  Continue glucose compliance and management as noted.  Continuous  monitoring at this  time.  Follow up with BPP and ultrasound for Doppler  studies in the a.m.  Consider expectant management versus delivery.  Patient is status post betamethasone at this time.      Lenoard Aden, M.D.  Electronically Signed     RJT/MEDQ  D:  01/10/2007  T:  01/10/2007  Job:  [id number]   cc:   Lenoard Aden, M.D.  Fax: [phone]

## 2011-02-21 NOTE — Op Note (Signed)
NAME:  [name], [name]                         ACCOUNT NO.:  [id number]   MEDICAL RECORD NO.:  [id number]                   PATIENT TYPE:  INP   LOCATION:  2874                                 FACILITY:  [facility]   PHYSICIAN:  Donalee Citrin, M.D.                     DATE OF BIRTH:  [birth year]   DATE OF PROCEDURE:  03/20/2004  DATE OF DISCHARGE:                                 OPERATIVE REPORT   PREOPERATIVE DIAGNOSES:  1. Grade 1 spondylolisthesis L5-S1 with degenerative joint disease.  2. 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   POSTOPERATIVE DIAGNOSES:  1. Grade 1 spondylolisthesis L5-S1 with degenerative joint disease.  2. 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   OPERATION/PROCEDURE:  1. Radical decompressive laminectomy L4-5, L5-S1.  2. Posterior lumbar interbody fusion L4-5, L5-S1, using 8 x 24 mm Tangent     allograft wedges, pedicle screw fixation.  L4-5, L5-S1 using the     Intensity rods and pedicle screw system.  3. Posterior arthrodesis L4 to S1.  4. Open reduction of spinal deformity L4-5, L5-S1.  5. Partial correction of scoliosis.   SURGEON:  Donalee Citrin, M.D.   ASSISTANT:  Tia Alert, M.D.   ANESTHESIA:  General endotracheal anesthesia.   INDICATIONS:  The patient is a 45 year old female who has had longstanding  back and left greater than right leg pain radiating down the top of the foot  and big toe consistent with an L5 radiculopathy.  The patient has been  refractory to all forms of conservative treatment.  Preoperative imaging  showed severe idiopathic scoliosis but also a grade 1 spondylolisthesis with  movement on dynamic films, significant degenerative disease, and disk space  collapse with spinal stenosis L5-S1, degenerative disease L4-5 with collapse  of the lateral foramen on the left at L4-5 due to the scoliosis.  The  patient was recommended radical decompressive laminectomy and fusion.  After  going over the risks and benefits of surgery with her, she understands  and  agrees to go forward.   DESCRIPTION OF PROCEDURE:  The patient was brought to the OR and positioned  prone.  The back was prepped and draped in the sterile fashion.  After  localization film confirmed the L5-S1 disk space.  I infiltrated extensive  lidocaine with epinephrine.  A midline incision was made.  Bovie  electrocautery was used to take down the subcutaneous tissue.  Subperiosteal  incision was carried out to the lamina of L3, 4, 5, and S1 exposing the TPs  of L4, L5 and S1 bilaterally.  The laminar complex of L5 was noted to have  bilateral pars defects and be hypermobile.  Using a Leksell rongeur, this  laminar complex was removed as well as the spinous process of the lamina of  L4 and complete medial facetectomies were performed at L4-5 and L5-S1.  At  L4-5, the L4 and L5 nerve roots were radically decompressed  at the neural  foramen.  The facet complexes were removed decompressing the thecal sac at  the level of the disk space.  Then at L5-S1, there was noted to be severe  compression of the L5 nerve roots due to the spondylolisthesis and the facet  complex was underbitten.  The L5 roots were decompressed throughout the  foramen and the S1 nerves roots were also decompressed throughout their  foramen and disk space was identified.  The epidural vasculature was  coagulated.  First, attention was taken to the L5-S1 disk space.  D'Errico  nerve retractor was used to reflect the left S1 nerve root medially.  Epidural vein was coagulated, disk space was cleaned out and noted to be  markedly collapsed and degenerated.  A size 7 distractor was used to be  placed in the disk space and opened it up.  Then D'Errico nerve retractor  was used to reflect the right S1 nerve root medially.  This disk space was  incised and cleaned and an 8 distractor was inserted.  The 8 distractor  approximated the end plates very well and it was decided to use a graft.  Two 8 x 24 mm Tangent allograft  wedges were opened at this time.   Then attention was taken back to the left side.  The L5-S1 disk space was  cleaned out, scraped with a cutter and chisel and a 8 x 24 mm Tangent  allograft was inserted on the left side.  This incision was continued on the  right side.  Locally harvested allograft was packed against the allograft on  the left side prior to allograft placed on the right.  At end of both  allograft placements, all apposition of the end plates were achieved.  The  L5 and S1 roots were completely decompressed and both allografts were 2 mm  deep to the posterior vertebral body line and confirmed by fluoroscopy.  Then attention was turned to L4-5.  Again this procedure was repeated with  an 8 distractor inserted.  Had good apposition.  The end plates were scraped  with downgoing Epstein curet cutter and chisel.  The 8 x 24 Tangent  allograft wedges were inserted on both sides at L4-5 with locally harvested  allograft between them.  At the end, L4 allografts were placed, fluoroscopy  confirmed good position and trajectory and all neural foramina widely  patent.  Attention was then turned to placement of pilot holes which were  drilled at L4.  Cannulated with awl, probed from both within the pedicle and  within the canal, noted to be competent, tapped with a 4.5 tap and a 5.5 x  40 pedicle screw inserted at L4 on the left, a 5.5 x 35 at L5 and a 5.5 x 30  at S1 on the left.  All pedicle screws were noted to be completely competent  and again, no medial lateral breach was appreciated.  All neural foramen  were examined, before, during and after all pedicles had good placement.   The three pedicle screws on the right side were inserted in similar fashion.  After all six pedicle screws were had been placed, #1 after the PLIF bone  placement at L5-S1, this reduced the spinal deformity at L5-S1 and with PLIF placement at L4-5, opened up the foramen on the left at L4-5 which was   collapsed due to the scoliosis.   Then lateral gutters and TPs were aggressively decorticated.  The remainder  of the local harvested  allograft was packed in the lateral gutters.  Rods  were inserted.  This was tightened down at S1.  The L5 pedicle screws were  compressed against S1 and the L4 compressed against L5.  A 4 x 22 Crosslink  was inserted, placed, and fluoroscopy confirmed good position of the screws,  rods and bone grafts.   Then the wound was copiously irrigated.  Gelfoam was laid on top of the  dura.  A medium Hemovac drain was placed.  Muscle and fascia were  reapproximated with 0 interrupted Vicryls, subcutaneous tissue with 2-0  interrupted Vicryl and skin was closed with a running 4-0 subcuticular.  Benzoin and Steri-Strips were applied. The patient went to the recovery room  in stable condition.                                               Donalee Citrin, M.D.    GC/MEDQ  D:  03/20/2004  T:  03/20/2004  Job:  [id number]

## 2011-02-21 NOTE — Assessment & Plan Note (Signed)
[facility]                           GASTROENTEROLOGY OFFICE NOTE   NAME:[name], [name]                      MRN:          [id number]  DATE:05/12/2006                            DOB:          1966-02-06    See my medical history and physical form in the [facility] chart for full  details.   ASSESSMENT:  1.  Constipation predominant with some alternating irritable bowel syndrome.      She is currently improved.  Verapamil was recently discontinued as an      antihypertensive, which may be having some effect, though it is not      clear.  2.  Left lower quadrant pain, chronic, unclear etiology.  Question irritable      bowel, question radicular pain related to prior back surgery, as it      started after that (lumbosacral spine surgery last year).  3.  Internal hemorrhoids.  Otherwise, normal colonoscopy to the cecum May 05, 2006.   PLAN:  1.  She passed all but 1 sitz markers on a sitz study.  This suggests normal      motility, and goes along with irritable bowel syndrome.  We are going to      try MiraLax as needed, as she has been.  She is trying to conceive, so      other agents are not really appropriate (i.e. Amitiza).  2.  Additionally, a trial of Align probiotic will be used for at least a      month.  Risks, benefits and indications were discussed with that.  3.  Followup will be as needed at this point.                                   Iva Boop, MD, [facility]   CEG/MedQ  DD:  05/12/2006  DT:  05/13/2006  Job #:  [id number]

## 2011-02-21 NOTE — Assessment & Plan Note (Signed)
[facility]                                 ON-CALL NOTE   NAME:[name], [name]                      MRN:          [id number]  DATE:01/23/2007                            DOB:          [birth year]    Phone number [phone].   SUBJECTIVE:  Upper respiratory tract infection, and saw Dr. Debby Bud.  She  also has asthma, which is worsening.  She has high blood pressure, and  is 9 months pregnant.   She is currently on Ceftin, Xopenex, and prednisone taper, day 3, and  they are not helping.  She is using Xopenex about every 4 hours.  She is  having wheezing that is worse at night.  She is feeling like she cannot  get her breathing under control.   ASSESSMENT AND PLAN:  Recommended going to the emergency room.  She will  go to [facility], and we will notify her OB/GYN as well.     Kerby Nora, MD  Electronically Signed    AB/MedQ  DD: 01/23/2007  DT: 01/23/2007  Job #: [id number]

## 2011-02-21 NOTE — Consult Note (Signed)
[name], LANGONE               ACCOUNT NO.:  [id number]   MEDICAL RECORD NO.:  [id number]          PATIENT TYPE:  INP   LOCATION:  9374                          FACILITY:  [facility]   PHYSICIAN:  Lenoard Aden, M.D.DATE OF BIRTH:  Apr 05, 1966   DATE OF CONSULTATION:  DATE OF DISCHARGE:                                 CONSULTATION   CHIEF COMPLAINT:  Elevated blood pressure and asthmatic symptomatology.   She is a 45 year old Caucasian female, G2, P1, who has a history of  elevated blood pressure on medication, insulin-dependent diabetes, and  asthma with difficulty breathing recently placed on antibiotics and  prednisone by her primary care doctor for re-evaluation.   SHE IS ALLERGIC TO PENICILLIN.   MEDICATIONS:  1. Insulin 48 units of Lantus in the morning, sliding scale as needed.  2. Aldomet 500 mg p.o. q.i.d.  3. Procardia XL 60 mg daily.  4. Prednisone.  5. Antibiotics as noted.  6. MiraLax.  7. Colace.  8. Nexium.  9. Folic acid.  10.Iron.   FAMILY HISTORY:  Diabetes, heart disease, myocardial infarction, chronic  hypertension, ovarian cancer, pulmonary fibrosis, and migraines.   PERSONAL MEDICAL HISTORY:  1. Back fusion.  2. Sinus surgery.  3. Irritable bowel.  4. Hypertension.  5. Asthma.  6. Esophageal reflux.  7. Migraine.  8. Class __________.  9. Knee surgery x4.  10.History of previous cesarean section.   Recent blood pressure in the office ranged from 140-160 over 90-100.   PHYSICAL EXAMINATION:  GENERAL:  She is a well-developed, well-  nourished, white female in no acute distress.  VITAL SIGNS:  Blood pressure 150/100.  HEENT:  Normal.  NECK:  Supple with full range of motion.  No thyromegaly.  LUNGS:  Clear to auscultation.  HEART:  Regular rate and rhythm.  ABDOMEN:  Soft, gravid, and nontender.  EXTREMITIES:  DTRs 2+.  No clonus.  Trace pretibial edema.  SKIN:  Intact.  NEUROLOGIC:  Nonfocal.   NST is reactive.  PIH panel is  pending.   IMPRESSION:  1. A 35-week obstetrical patient.  2. Insulin-dependent gestational diabetes, stable.  3. Chronic hypertension with slight exacerbation.  PIH panel pending.  4. History of asthmatic exacerbation, currently with normal physical      exam, awaiting input from her primary care doctor regarding      admission versus outpatient therapy.   PLAN:  1. We will admit per recommendation from her PCP for IV steroids as      needed.  2. Otherwise maintain antihypertensive and antidiabetic type therapy.  3. Followup as scheduled.  4. Check PIH panel.      Lenoard Aden, M.D.  Electronically Signed     RJT/MEDQ  D:  01/23/2007  T:  01/23/2007  Job:  [id number]

## 2011-02-21 NOTE — Discharge Summary (Signed)
[name], [name]               ACCOUNT NO.:  [id number]   MEDICAL RECORD NO.:  [id number]          PATIENT TYPE:  INP   LOCATION:  9156                          FACILITY:  [facility]   PHYSICIAN:  Lenoard Aden, M.D.DATE OF BIRTH:  [birth year]   DATE OF ADMISSION:  01/10/2007  DATE OF DISCHARGE:  01/12/2007                               DISCHARGE SUMMARY   The patient underwent uncomplicated admission for chronic hypertension  with exacerbation. She was admitted and had serial labs which were  within normal limits. Blood pressure stabilized. Fetal heart  surveillance was within normal limits. She tolerated a regular diet  well, ambulated without difficulty, blood pressure being stable. She was  discharged to home day 2 at 33 plus weeks with chronic hypertension  stable, no evidence of preeclampsia. Follow up in the office within 1  week.      Lenoard Aden, M.D.  Electronically Signed     RJT/MEDQ  D:  03/17/2007  T:  03/18/2007  Job:  [id number]

## 2011-02-21 NOTE — Consult Note (Signed)
NAMEEVANGELA, HEFFLER               ACCOUNT NO.:  [id number]   MEDICAL RECORD NO.:  [id number]          PATIENT TYPE:  MAT   LOCATION:  [facility]                          FACILITY:  [facility]   PHYSICIAN:  Richardean Sale, M.D.   DATE OF BIRTH:  1966-02-25   DATE OF CONSULTATION:  08/09/2006  DATE OF DISCHARGE:                                 CONSULTATION   CHIEF COMPLAINT:  Abdominal pain, nausea and vomiting, [redacted] weeks  pregnant.   HISTORY OF PRESENT ILLNESS:  This is a 45 year old, gravida 2, para 1,  white female who is at 39 plus weeks gestation by an ultrasound  performed in the office, who presents today complaining of left lower  quadrant pain, nausea and vomiting. The patient has been vomiting for  the last 24 hours. She tried to take Phenergan with no improvement.  She  denies any lightheadedness or dizziness.  Denies any vaginal bleeding.  She has had an ultrasound this pregnancy to confirm and she was seen in  the office last week and heart tones were confirmed.  The patient  reports left lower quadrant pain that has been awakening her at night  ranging anywhere from a 5 to 9 out of 10. Per the patient, this pain has  been present for over a year.  She has seen four different physicians to  evaluate this pain and they have had no determined etiology.  The pain  occurs at random.  She is uncertain as to whether or not it is related  to her bowels.  Her last bowel movement well-appearing Friday but she  does report having a history of irritable bowel syndrome.  The pain was  previously relieved with ibuprofen but she has not tried taking that.  Obviously she is pregnant. Last dose of Tylenol was over 24 hours ago.   PAST MEDICAL HISTORY:  Significant for:  1. Hypertension.  2. Asthma.  3. Reflux.   PAST SURGICAL HISTORY:  1. She has had knee surgery x4.  2. Back fusion.  3. Sinus surgery.   OB HISTORY:  Vaginal delivery x1.   GYN HISTORY:  She reports she had a cyst in the  vagina but no sexually  transmitted infections or abnormal Pap smears.   PHYSICAL EXAMINATION:  VITAL SIGNS:  Blood pressure 137/78, pulse 67,  respirations 18.  She is afebrile.  GENERAL:  She is well-developed, well-nourished white female who appears  in no acute distress.  ABDOMEN:  Soft, mildly tender to palpation in the left lower quadrant.  No rebound or guarding.  EXTREMITIES:  Normal.   Fetal heart tones confirmed with ultrasound.   LABORATORY DATA:  White count 6.4, hemoglobin 12.4, hematocrit 36.7,  platelets 199.  Sodium 133, potassium 3.5, chloride 103, carbon dioxide  21,  BUN 8, creatinine 0.6,  glucose 157.  This was obtained  approximately three hours after the patient's last meal.  ALT and AST  normal.  Urine shows 500 glucose, negative ketones, specific gravity  greater than 1.030, negative nitrite, negative esterase.   ASSESSMENT:  A 45 year old, gravida 2, para 1, white female  with nausea  and vomiting as well as left lower quadrant pain that has been present  for over a year of undetermined etiology.   PLAN:  1. The patient's nausea has improved since receiving Zofran here and a      liter of IV fluid. Will plan free Zofran ODT 4 mg p.o. q.4 h.      p.r.n. for nausea.  2. Left lower quadrant pain.  This may be related to GI function. Will      try Bentyl 10 mg tablets  one to two tablets p.o. q.i.d. p.r.n.      abdominal pain.  I instructed the patient to contact her      gastroenterologist for any further evaluation. The patient is to      keep her appointment Wednesday in the office for her ultra screen.      At that time we will evaluate ovary.  There is no obvious adnexal      masses seen on abdominal ultrasound to day that was performed at      the bedside.  That was performed only to document heart tones.  3. Glucosuria and elevated blood glucose.  The patient does report a      history of gestational diabetes last pregnancy.  Will check one-      hour  Glucola in the office beginning in the second trimester.      Richardean Sale, M.D.  Electronically Signed     JW/MEDQ  D:  08/09/2006  T:  08/10/2006  Job:  [id number]

## 2011-02-21 NOTE — Consult Note (Signed)
[name], [name]               ACCOUNT NO.:  [id number]   MEDICAL RECORD NO.:  [id number]          PATIENT TYPE:  MAT   LOCATION:  [facility]                          [facility]ILITY:  [facility]   PHYSICIAN:  Lenoard Aden, M.D.DATE OF BIRTH:  1966-02-12   DATE OF CONSULTATION:  01/27/2007  DATE OF DISCHARGE:                                 CONSULTATION   EMERGENCY ROOM CONSULTATION VISIT:   CHIEF COMPLAINT:  Hypertensive exacerbation with elevated blood  pressures in the office.   She is a 45 year old white female, G2, P1, with a history of chronic  hypertension with blood pressure elevation.  She denies headaches,  visual symptoms, or epigastric pain.  She reports good fetal movement.  She denies bleeding or contractions.   She has allergies to PENICILLIN.   Medications are multiple, to include insulin, Aldomet, Procardia,  MiraLax, Colace, Nexium, folic acid, iron, and multiple asthma  medications.   She has a family history of diabetes, heart disease, myocardial  infarction, chronic hypertension, ovarian cancer, pulmonary fibrosis,  and migraines.   Past medical history is remarkable for a personal history of back  fusion, sinus surgery, irritable bowel, hypertension, asthma, esophageal  reflux, migraine, chronic UTI.  She has had a previous knee surgery x4.  Pregnancy course has been complicated by chronic hypertension with  normal 24-hour urine and normal PIH panels.  Insulin dependent  gestational diabetes, now on insulin, and normal ultrasound and fetal  heart rate surveillance.   PHYSICAL EXAMINATION:  VITAL SIGNS:  Blood pressures initially ranging  from 170-190/100-120 after Procardia.  After Procardia and taking her  Aldomet dose, now 140s-150s/80s-100.  GENERAL:  She is a well-developed and well-nourished white female in no  acute distress.  Blood pressures as noted.  HEENT:  Normal.  NECK:  Supple.  Full range of motion.  No thyromegaly.  LUNGS:  Clear.  HEART:   Regular rate and rhythm.  ABDOMEN:  Soft, gravid, nontender.  No right upper quadrant tenderness  noted.  No CVA tenderness is noted.  EXTREMITIES:  DTRs 2+.  No evidence of clonus.  Trace pretibial edema  noted.  SKIN:  Intact.  NEUROLOGIC:  Nonfocal.  PELVIC:  Deferred.  NST is reactive with no evidence of contractions.  The [facility] panel is completely within normal limits with normal CBC and  normal complete metabolic profile.   IMPRESSION:  1. This is a 35-6/7 week intrauterine pregnancy.  2. Previous cesarean section, for repeat.  3. Chronic hypertension with exacerbation, on multiple medications,      responded to interval doses of Procardia this evening.   PLAN:  Discharge home.  Strict preeclamptic warnings given.  Patient is  scheduled for amniocentesis for lung maturity.  Return to the office in  the morning.  She will follow up as scheduled.      Lenoard Aden, M.D.  Electronically Signed     RJT/MEDQ  D:  01/27/2007  T:  01/27/2007  Job:  [id number]

## 2011-02-21 NOTE — Assessment & Plan Note (Signed)
[facility]                                 ON-CALL NOTE   JESSA, STINSON                      MRN:          [id number]  DATE:10/11/2006                            DOB:          07-May-1966    Patient of Dr.  Debby Bud. Called from [phone] at 8:36 a.m. on October 11, 2006, stating that she had been put on Ceftin for 7 days after being  seen in Dr.  Debby Bud office, but is finished that Friday and is still not  feeling any better. The patient is pregnant and is using an albuterol  inhaler as well Xopenex and a nebulizer. I explained that normally we do  not call in antibiotics on the weekend, but because she had just  finished 7 days and was not feeling better, I would call in some, but  that she would need to follow up with Dr.  Debby Bud next week if her  symptoms do not improve. A Z-Pak  #1 was called in to [facility] on [address].     Lelon Perla, DO  Electronically Signed    Shawnie Dapper  DD: 10/11/2006  DT: 10/11/2006  Job #: [id number]   cc:   Rosalyn Gess. Norins, MD

## 2011-02-21 NOTE — Discharge Summary (Signed)
[name], MOLINO               ACCOUNT NO.:  [id number]   MEDICAL RECORD NO.:  [id number]          PATIENT TYPE:  INP   LOCATION:  9148                          [facility]ACILITY:  [facility]   PHYSICIAN:  Gerri Spore B. Earlene Plater, M.D.  DATE OF BIRTH:  [birth year]   DATE OF ADMISSION:  01/28/2007  DATE OF DISCHARGE:  01/31/2007                               DISCHARGE SUMMARY   ADMISSION DIAGNOSES:  1. Intrauterine pregnancy at 36 weeks.  2. Chronic hypertension.  3. Probable severe superimposed preeclampsia.  4. Insulin-requiring gestational diabetes.  5. Severe back pain with history of back surgery requesting primary      cesarean section.  6. Desired tubal sterilization.   DISCHARGE DIAGNOSES:  1. Intrauterine pregnancy at 36 weeks.  2. Chronic hypertension.  3. Probable severe superimposed preeclampsia.  4. Insulin-requiring gestational diabetes.  5. Severe back pain with history of back surgery requesting primary      cesarean section.  6. Desired tubal sterilization.   PROCEDURE:  Primary low transverse C-section and bilateral tubal  ligation.   HISTORY OF PRESENT ILLNESS:  For complete details please see the H&P on  chart.  Briefly, the patient was managed throughout pregnancy with the  above issues, presented 36 weeks for amniocentesis for fetal lung  maturity to allow for delivery but was noted to have extreme elevated  blood pressures as high as 200/120 with associated headache.  Given her  severe symptoms recommended against amniocentesis and moved on to  delivery for concern with superimposed preeclampsia.  500 mg of protein  in the urine on office dipstick.   HOSPITAL COURSE:  The patient was admitted, underwent primary low  transverse C-section without difficulty.  Also tubal ligation performed  without incident.  No abnormalities of the uterus or pelvis were noted  at the time of surgery.   The patient was kept on magnesium sulfate postpartum for seizure  prophylaxis.  Her  preeclampsia labs were normal.  Blood sugars were  managed with insulin sliding scale, blood pressures were managed as  needed with IV labetalol.   Also, the patient diuresed well.  Blood pressures improved and blood  sugars improved as well.  She was discharged on the third postoperative  day in satisfactory condition.  Follow-up 1 week blood pressure check  and staple removal.   DISCHARGE INSTRUCTIONS:  Per booklet.   DISCHARGE MEDICATIONS:  1. Percocet 1-2 every 4-6 hours p.r.n. pain.  2. Procardia 90 mg daily.  3. Aldomet 500 mg q.i.d.  4. Lantus 44 units a.m.  5. NovoLog sliding scale as directed.   Follow-up 1 week in office.   DISPOSITION:  Discharged satisfactory and improved.      Gerri Spore B. Earlene Plater, M.D.  Electronically Signed     WBD/MEDQ  D:  03/10/2007  T:  03/10/2007  Job:  [id number]

## 2011-02-21 NOTE — Assessment & Plan Note (Signed)
[facility]                         GASTROENTEROLOGY OFFICE NOTE   NAME:SHELTONFeiga, Nadel                      MRN:          [id number]  DATE:09/02/2006                            DOB:          25-Sep-1966    CHIEF COMPLAINT:  Left upper quadrant pain.   Ms. Croson is 4 months pregnant.  She has had increase in her reflux,  including some severe burning chest pressure radiating into her shoulder  that has improved taking Nexium twice a day.  Dr. Earlene Plater changed her from  Prilosec to Nexium after she became pregnant.  She has also had 2-3  weeks' problems with left upper quadrant pain that is constant.  She  gets spells of feeling nauseous and hot with that, though she has been  nauseous and had some vomiting with pregnancy that is helped by Zofran.  She is not having any difficulty with the pregnancy.  Dicyclomine helps  her intermittent left lower quadrant pain from her irritable bowel, and  she wants a refill of that.  She has had difficulty sleeping.  Tylenol  has not helped this pain.  She has no urinary symptoms.  She is  finishing a short course of erythromycin for sinusitis.  Maalox has not  helped this pain either.  Her bowel habits are under reasonable control  at this point, though she does suffer from constipation-predominant  irritable bowel.   MEDICATIONS:  Listed and reviewed in the chart today.   PAST MEDICAL HISTORY:  Reviewed and unchanged other than the new  pregnancy.   PHYSICAL EXAMINATION:  VITAL SIGNS:  Weight 160, pulse 84, blood  pressure 108/72.  HEENT:  Eyes:  Anicteric.  BACK:  Mild CVA tenderness.  LUNGS:  Clear.  CARDIAC:  S1, S2.  No murmurs or gallops.  ABDOMEN:  Gravid.  She has mild tenderness in the left lower quadrant.  She is very tender over the left ribs anteriorly and laterally, and she  has increased pain on muscle tension of the abdominal wall of the left  upper quadrant.   ASSESSMENT:  1. Left upper  quadrant pain due to musculoskeletal strain from      vomiting.  2. Gastroesophageal reflux disease, worse while pregnant.  3. Irritable bowel syndrome, doing reasonably well.   PLAN:  1. Refill dicyclomine one to two 4 times a day as needed, #90 with 5      refills.  2. Continue Nexium b.i.d.  Some samples are given to help with that at      this point.  Further refills through Dr. Earlene Plater at this point when      she follows up with him.  3. Heat and Tylenol for her abdominal wall strain and costochondritis.      Ask Dr. Earlene Plater regarding other medications that she may be able to      take for this, NSAIDs appear to be      contraindicated during her pregnancy.  4. Follow up with me as needed.     Iva Boop, MD,FACG  Electronically Signed    CEG/MedQ  DD: 09/02/2006  DT: 09/02/2006  Job #: [id number]   cc:   Gerri Spore B. Earlene Plater, M.D.

## 2011-02-28 ENCOUNTER — Encounter: Payer: Self-pay | Admitting: Internal Medicine

## 2011-07-01 ENCOUNTER — Other Ambulatory Visit: Payer: Self-pay | Admitting: Internal Medicine

## 2011-08-16 ENCOUNTER — Other Ambulatory Visit: Payer: Self-pay | Admitting: Internal Medicine

## 2011-09-16 ENCOUNTER — Ambulatory Visit (INDEPENDENT_AMBULATORY_CARE_PROVIDER_SITE_OTHER): Payer: PRIVATE HEALTH INSURANCE | Admitting: Endocrinology

## 2011-09-16 ENCOUNTER — Encounter: Payer: Self-pay | Admitting: Endocrinology

## 2011-09-16 VITALS — BP 102/70 | HR 100 | Temp 97.9°F | Ht 64.0 in | Wt 145.8 lb

## 2011-09-16 DIAGNOSIS — J209 Acute bronchitis, unspecified: Secondary | ICD-10-CM

## 2011-09-16 MED ORDER — AZITHROMYCIN 500 MG PO TABS: 500.0000 mg | ORAL_TABLET | Freq: Every day | ORAL | Status: AC

## 2011-09-16 MED ORDER — METHYLPREDNISOLONE 4 MG PO KIT: PACK | ORAL | Status: AC

## 2011-09-16 MED ORDER — PROMETHAZINE-CODEINE 6.25-10 MG/5ML PO SYRP: 5.0000 mL | ORAL_SOLUTION | ORAL | Status: AC | PRN

## 2011-09-16 NOTE — Patient Instructions (Addendum)
Here are 3 prescriptions, to help you feel better. I hope you feel better soon.  If you don't feel better by next week, please call dr Debby Bud.

## 2011-09-16 NOTE — Progress Notes (Signed)
  Subjective:    Patient ID: [name], female    DOB: [birth year], 45 y.o.   MRN: [id number]  HPI Pt states few weeks of constant moderate dry-quality cough in the chest, and assoc intermittent wheezing.   Past Medical History  Diagnosis Date  . ALLERGIC RHINITIS 10/08/2007  . ASTHMA 04/22/2007  . GERD 04/22/2007  . Headache 10/08/2007  . HYPERTENSION 04/22/2007  . HYPERLIPIDEMIA 04/22/2007  . HYPERTENSION 04/22/2007  . LOW BACK PAIN 06/27/2008  . LUQ PAIN 09/06/2010  . NAUSEA 09/06/2010  . IBS (irritable bowel syndrome)     constipation predominant    Past Surgical History  Procedure Date  . Knee surgery 92, 96, 01, 08    right  . Knee surgery 88    left  . Nasal polyp surgery 95    sinus  . Lumbar fusion 05    L4-5 S1  . Tubal ligation   . Cesarean section     History   Social History  . Marital Status: Married    Spouse Name: N/A    Number of Children: N/A  . Years of Education: N/A   Occupational History  . Homemaker [employer]   Social History Main Topics  . Smoking status: Never Smoker   . Smokeless tobacco: Not on file  . Alcohol Use: No  . Drug Use: No  . Sexually Active:    Other Topics Concern  . Not on file   Social History Narrative   [employer]ried 96'1 son '98, 1 daughter '08Stay at home mom with a w/c bound sonSO in good health. Work is uncertain for him (Jan 09')Daily Caffeine Use    Current Outpatient Prescriptions on File Prior to Visit  Medication Sig Dispense Refill  . cyclobenzaprine (FLEXERIL) 10 MG tablet TAKE ONE-HALF TABLET BY MOUTH TWICE DAILY AS NEEDED  30 tablet  2  . lisinopril-hydrochlorothiazide (PRINZIDE,ZESTORETIC) 10-12.5 MG per tablet TAKE ONE TABLET BY MOUTH ONE TIME DAILY  90 tablet  0    Allergies  Allergen Reactions  . Penicillins     Family History  Problem Relation Age of Onset  . Hypertension Mother   . Arthritis Mother     OA s/p bilateral hip replacement  . Diabetes Father   . Heart disease  Father     CAD  . Hyperlipidemia Father   . Hypertension Father   . Pulmonary fibrosis Father   . Osteopenia Sister     older with GI problems s/p colectomy  . Cancer Maternal Grandmother     Ovarian Cancer  . Osteopenia Sister     BP 102/70  Pulse 100  Temp(Src) 97.9 F (36.6 C) (Oral)  Ht 5\' 4"  (1.626 m)  Wt 145 lb 12 oz (66.112 kg)  BMI 25.02 kg/m2  SpO2 99%    Review of Systems Denies fever and sore throat    Objective:   Physical Exam VITAL SIGNS:  See vs page GENERAL: no distress head: no deformity eyes: no periorbital swelling, no proptosis external nose and ears are normal mouth: no lesion seen Both eac's and tm's are normal NECK: There is no palpable thyroid enlargement.  No thyroid nodule is palpable.  No palpable lymphadenopathy at the anterior neck. LUNGS:  Clear to auscultation       Assessment & Plan:  Acute bronchitis, new

## 2011-09-20 ENCOUNTER — Other Ambulatory Visit: Payer: Self-pay | Admitting: Internal Medicine

## 2011-09-24 ENCOUNTER — Telehealth: Payer: Self-pay

## 2011-09-24 MED ORDER — PREDNISONE 10 MG PO TABS: 10.0000 mg | ORAL_TABLET | Freq: Every day | ORAL | Status: DC

## 2011-09-24 NOTE — Telephone Encounter (Signed)
Pt called stating she completed ABX but still needs to use her nebulizer tid for SOB and wheezing. Pt is requesting refill of Prednisone, please advise.

## 2011-09-24 NOTE — Telephone Encounter (Signed)
Called pt- no fever, some sputum, short of breath with more wheezing Plan - prednisone burst and taper sent in - [facility] [address]

## 2011-09-25 ENCOUNTER — Other Ambulatory Visit: Payer: Self-pay | Admitting: *Deleted

## 2011-09-25 MED ORDER — MONTELUKAST SODIUM 10 MG PO TABS: 10.0000 mg | ORAL_TABLET | Freq: Every day | ORAL | Status: DC

## 2011-09-25 MED ORDER — LOVASTATIN 40 MG PO TABS: 40.0000 mg | ORAL_TABLET | Freq: Every day | ORAL | Status: DC

## 2011-09-25 MED ORDER — ALBUTEROL SULFATE HFA 108 (90 BASE) MCG/ACT IN AERS: 2.0000 | INHALATION_SPRAY | Freq: Four times a day (QID) | RESPIRATORY_TRACT | Status: DC | PRN

## 2011-09-25 NOTE — Telephone Encounter (Signed)
Pt called requesting refills of Singular, Ventolin, and Lovastatin sent to [facility]. Pt informed rx sent to pharmacy.

## 2011-11-17 ENCOUNTER — Other Ambulatory Visit: Payer: Self-pay | Admitting: Internal Medicine

## 2011-11-17 ENCOUNTER — Other Ambulatory Visit (HOSPITAL_COMMUNITY)
Admission: RE | Admit: 2011-11-17 | Discharge: 2011-11-17 | Disposition: A | Discharge status: Home / Self Care | Payer: PRIVATE HEALTH INSURANCE | Source: Ambulatory Visit | Attending: Internal Medicine | Admitting: Internal Medicine

## 2011-11-17 ENCOUNTER — Encounter: Payer: Self-pay | Admitting: Internal Medicine

## 2011-11-17 ENCOUNTER — Ambulatory Visit (INDEPENDENT_AMBULATORY_CARE_PROVIDER_SITE_OTHER): Payer: PRIVATE HEALTH INSURANCE | Admitting: Internal Medicine

## 2011-11-17 ENCOUNTER — Other Ambulatory Visit (INDEPENDENT_AMBULATORY_CARE_PROVIDER_SITE_OTHER): Payer: PRIVATE HEALTH INSURANCE

## 2011-11-17 DIAGNOSIS — K219 Gastro-esophageal reflux disease without esophagitis: Secondary | ICD-10-CM

## 2011-11-17 DIAGNOSIS — Z01419 Encounter for gynecological examination (general) (routine) without abnormal findings: Secondary | ICD-10-CM | POA: Insufficient documentation

## 2011-11-17 DIAGNOSIS — I10 Essential (primary) hypertension: Secondary | ICD-10-CM

## 2011-11-17 DIAGNOSIS — R1012 Left upper quadrant pain: Secondary | ICD-10-CM

## 2011-11-17 DIAGNOSIS — Z0000 Encounter for general adult medical examination without abnormal findings: Secondary | ICD-10-CM

## 2011-11-17 DIAGNOSIS — M545 Low back pain, unspecified: Secondary | ICD-10-CM

## 2011-11-17 DIAGNOSIS — E785 Hyperlipidemia, unspecified: Secondary | ICD-10-CM

## 2011-11-17 DIAGNOSIS — J45909 Unspecified asthma, uncomplicated: Secondary | ICD-10-CM

## 2011-11-17 DIAGNOSIS — R7309 Other abnormal glucose: Secondary | ICD-10-CM

## 2011-11-17 DIAGNOSIS — J309 Allergic rhinitis, unspecified: Secondary | ICD-10-CM

## 2011-11-17 LAB — LIPID PANEL
HDL: 66 mg/dL (ref >39.00)
LDL Cholesterol: 119 mg/dL — ABNORMAL HIGH (ref 0–99)
Total CHOL/HDL Ratio: 3
Triglycerides: 75 mg/dL (ref 0.0–149.0)
VLDL: 15 mg/dL (ref 0.0–40.0)

## 2011-11-17 LAB — COMPREHENSIVE METABOLIC PANEL
ALT: 29 U/L (ref 0–35)
AST: 27 U/L (ref 0–37)
CO2: 25 mEq/L (ref 19–32)
Calcium: 9.2 mg/dL (ref 8.4–10.5)
Chloride: 107 mEq/L (ref 96–112)
GFR: 96.18 mL/min (ref >60.00)
Potassium: 4 mEq/L (ref 3.5–5.1)
Sodium: 137 mEq/L (ref 135–145)
Total Protein: 6.7 g/dL (ref 6.0–8.3)

## 2011-11-17 LAB — HEPATIC FUNCTION PANEL
Albumin: 4.1 g/dL (ref 3.5–5.2)
Total Protein: 6.7 g/dL (ref 6.0–8.3)

## 2011-11-17 MED ORDER — LISINOPRIL-HYDROCHLOROTHIAZIDE 10-12.5 MG PO TABS: 1.0000 | ORAL_TABLET | Freq: Every day | ORAL | Status: DC

## 2011-11-17 MED ORDER — CYCLOBENZAPRINE HCL 10 MG PO TABS: 10.0000 mg | ORAL_TABLET | Freq: Three times a day (TID) | ORAL | Status: DC | PRN

## 2011-11-17 MED ORDER — BUTALBITAL-ASPIRIN-CAFFEINE 50-325-40 MG PO CAPS: 1.0000 | ORAL_CAPSULE | ORAL | Status: DC | PRN

## 2011-11-17 NOTE — Assessment & Plan Note (Signed)
Interval medical hisotry is stable without major events. Phyiscal exam, including breast and pelvic exams, is normal. Lab results are excellent showing good control of chronic ailments. Immunization -given Tdap today.  In summary - a very nice woman with several chronic medical problems who appears to be stable at this time.  She will return as needed or in 1 year.

## 2011-11-17 NOTE — Assessment & Plan Note (Signed)
BP Readings from Last 3 Encounters:  11/17/11 98/68  09/16/11 102/70  09/06/10 118/70   OK control if not just a little too low today. She is asymptomatic.  Plan - continue present medical regimen

## 2011-11-17 NOTE — Assessment & Plan Note (Signed)
LDL of 119 down from 176 and bettert ahn goal of 130 or less. She is tolerating "statin" medication well.  Plan - continue present regimen

## 2011-11-17 NOTE — Patient Instructions (Signed)
Doing well, full report to follow with labs and pap results.

## 2011-11-17 NOTE — Progress Notes (Signed)
Subjective:    Patient ID: [name], female    DOB: September 19, 1966, 46 y.o.   MRN: [id number]  HPI [name] presents for routine medical follow-up. She had health screening January '13: total cholesterol 211; blood pressure 123/77, BMI 24.6, serum glucose 64. She has been doing home A1C determinations: 09/17/10 - 6.2%, 01/24/11 - 6.5%, 11/11/11- 6.9%. Doing OK except for back pain and LUQ pain. She has not had any major medical illness, no surgery , no injury. She haqs a high stress life with work and care of her son. She is considering consultation with pain specialist in [place], [place] .   Past Medical History  Diagnosis Date  . ALLERGIC RHINITIS 10/08/2007  . ASTHMA 04/22/2007  . GERD 04/22/2007  . Headache 10/08/2007  . HYPERTENSION 04/22/2007  . HYPERLIPIDEMIA 04/22/2007  . HYPERTENSION 04/22/2007  . LOW BACK PAIN 06/27/2008  . LUQ PAIN 09/06/2010  . NAUSEA 09/06/2010  . IBS (irritable bowel syndrome)     constipation predominant   Past Surgical History  Procedure Date  . Knee surgery 92, 96, 01, 08    right  . Knee surgery 88    left  . Nasal polyp surgery 95    sinus  . Lumbar fusion 05    L4-5 S1  . Tubal ligation   . Cesarean section    Family History  Problem Relation Age of Onset  . Hypertension Mother   . Arthritis Mother     OA s/p bilateral hip replacement  . Diabetes Father   . Heart disease Father     CAD  . Hyperlipidemia Father   . Hypertension Father   . Pulmonary fibrosis Father   . Osteopenia Sister     older with GI problems s/p colectomy  . Cancer Maternal Grandmother     Ovarian Cancer  . Osteopenia Sister    History   Social History  . Marital Status: Married    Spouse Name: N/A    Number of Children: 2  . Years of Education: 16   Occupational History  . substitute teacher [employer]   Social History Main Topics  . Smoking status: Never Smoker   . Smokeless tobacco: Never Used  . Alcohol Use: No  . Drug Use: No  . Sexually  Active: Yes -- Female partner(s)   Other Topics Concern  . Not on file   Social History Narrative   [employer]. Married 96'. 1 son '98, 1 daughter '08. 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 SO in good health. Work - Photographer - data and Naval architect. Daily Caffeine Use      Review of Systems Constitutional:  Negative for fever, chills, activity change and unexpected weight change.  HEENT:  Negative for hearing loss, ear pain, congestion, neck stiffness and postnasal drip. Negative for sore throat or swallowing problems. Negative for dental complaints.   Eyes: Negative for vision loss or change in visual acuity.  Respiratory: Negative for chest tightness and wheezing. Negative for DOE.   Cardiovascular: Negative for chest pain or palpitations. No decreased exercise tolerance Gastrointestinal: No change in bowel habit. No bloating or gas. No reflux or indigestion. Symptoms of chronic IBS Genitourinary: Negative for urgency, frequency, flank pain and difficulty urinating.  Musculoskeletal: Negative for myalgias, back pain, arthralgias and gait problem.  Neurological: Negative for dizziness, tremors, weakness and headaches. Her right  great toe will become numb from time to time.  Hematological: Negative for adenopathy.  Psychiatric/Behavioral: Negative for behavioral problems and dysphoric mood.       Objective:   Physical Exam Filed Vitals:   11/17/11 0909  BP: 98/68  Pulse: 99  Temp: 97 F (36.1 C)  Resp: 16  Weight: 152 lb 6 oz (69.117 kg)  Body mass index is 26.57 kg/(m^2).  Gen'l: well nourished, well developed white wooman in no distress HEENT - [place]/AT, EACs/TMs normal, oropharynx with native dentition in good condition, no buccal or palatal lesions, posterior pharynx clear, mucous membranes moist. C&S clear, PERRLA, fundi - normal Neck - supple, no thyromegaly Nodes-  negative submental, cervical, supraclavicular regions Chest - no deformity, no CVAT Lungs - cleat without rales, wheezes. No increased work of breathing Breast - skin normal, no nipple discharge, minor fibrocystic changes w/o fixed mass or lesion Cardiovascular - regular rate and rhythm, quiet precordium, no murmurs, rubs or gallops, 2+ radial, DP and PT pulses Abdomen - BS+ x 4, no HSM, no guarding or rebound or tenderness Pelvic - NEG/BUS nl, normal entroitus and vaginal mucosa, cervix parous without abnormality, PAP performed. Rectal - deferred to GI Extremities - no clubbing, cyanosis, edema or deformity.  Neuro - A&O x 3, CN II-XII normal, motor strength normal and equal, DTRs 2+ and symmetrical biceps, radial, and patellar tendons. Cerebellar - no tremor, no rigidity, fluid movement and normal gait. Derm - Head, neck, back, abdomen and extremities without suspicious lesions  Lab Results  Component Value Date   WBC 5.6 08/30/2010   HGB 10.0* 11/17/2011   HCT 30.4* 11/17/2011   PLT 216.0 08/30/2010   GLUCOSE 102* 11/17/2011   CHOL 200 11/17/2011   TRIG 75.0 11/17/2011   HDL 66.00 11/17/2011   LDLDIRECT 172.6 08/30/2010   LDLCALC 119* 11/17/2011        ALT 29 11/17/2011   AST 27 11/17/2011        NA 137 11/17/2011   K 4.0 11/17/2011   CL 107 11/17/2011   CREATININE 0.7 11/17/2011   BUN 17 11/17/2011   CO2 25 11/17/2011   TSH 1.86 08/30/2010   HGBA1C 6.5* 01/07/2008          Assessment & Plan:

## 2011-11-17 NOTE — Assessment & Plan Note (Signed)
Chronic abdominal region pain. She has been seen by GI w/o definitive diagnosis. No further testing indicated at this time.

## 2011-11-17 NOTE — Assessment & Plan Note (Signed)
Chronic back pain that is stable. She does see improvement with regular exercise under the guidance of a trainer. She will also benefit from home based flex/stretch exercise. No need for any change in medications.

## 2011-11-17 NOTE — Assessment & Plan Note (Signed)
Stable - no using nasal inhalational steroid sprays

## 2011-11-17 NOTE — Assessment & Plan Note (Signed)
Recent mild flare but generally doing well without stable symptoms and infrequent use of rescue medications.  Plan - no change in chronic med table.

## 2011-11-17 NOTE — Assessment & Plan Note (Signed)
Glucose at 102 is normal range.  Plan- continue life-style management with low sugar low carb diet.

## 2011-11-23 ENCOUNTER — Encounter: Payer: Self-pay | Admitting: Internal Medicine

## 2011-11-26 ENCOUNTER — Encounter: Payer: Self-pay | Admitting: Internal Medicine

## 2011-11-26 ENCOUNTER — Ambulatory Visit (INDEPENDENT_AMBULATORY_CARE_PROVIDER_SITE_OTHER): Payer: PRIVATE HEALTH INSURANCE | Admitting: Internal Medicine

## 2011-11-26 VITALS — BP 98/70 | HR 109 | Temp 98.0°F | Resp 16 | Wt 150.5 lb

## 2011-11-26 DIAGNOSIS — J329 Chronic sinusitis, unspecified: Secondary | ICD-10-CM

## 2011-11-26 MED ORDER — LEVOFLOXACIN 500 MG PO TABS: 500.0000 mg | ORAL_TABLET | Freq: Every day | ORAL | Status: AC

## 2011-11-26 MED ORDER — FLUOCINONIDE 0.05 % EX CREA: TOPICAL_CREAM | Freq: Two times a day (BID) | CUTANEOUS | Status: DC

## 2011-11-30 NOTE — Progress Notes (Signed)
  Subjective:    Patient ID: [name], female    DOB: 11/17/1965, 46 y.o.   MRN: [id number]  HPI [name] presents with sinus drainage, congestion, sinus pain, low grade fever. She has had multiple sinus infections over time. She does have a productive cough but no SOB/DOE, no wheezing.     Review of Systems System review is negative for any constitutional, cardiac, pulmonary, GI or neuro symptoms or complaints other than as described in the HPI.     Objective:   Physical Exam Filed Vitals:   11/26/11 1141  BP: 98/70  Pulse: 109  Temp: 98 F (36.7 C)  Resp: 16   Gen'l - WNWD white woman in no acute distress HEENT - tender to percussion over frontal sinus PUlm - normal respirations, no rales or wheezes        Assessment & Plan:  Sinusitis - recurrent problem. Plan - levaquin 500 mg qd x 7           Supportive care

## 2012-02-21 ENCOUNTER — Other Ambulatory Visit: Payer: Self-pay | Admitting: Internal Medicine

## 2012-02-24 ENCOUNTER — Other Ambulatory Visit: Payer: Self-pay | Admitting: *Deleted

## 2012-02-24 MED ORDER — LOVASTATIN 40 MG PO TABS: 40.0000 mg | ORAL_TABLET | Freq: Every day | ORAL | Status: DC

## 2012-02-24 MED ORDER — MONTELUKAST SODIUM 10 MG PO TABS: 10.0000 mg | ORAL_TABLET | Freq: Every day | ORAL | Status: DC

## 2012-02-24 NOTE — Telephone Encounter (Signed)
REFILL ON SINGULAIR AND MEVACOR SENT TO PHARMACY.

## 2012-03-19 ENCOUNTER — Other Ambulatory Visit: Payer: Self-pay | Admitting: Internal Medicine

## 2012-05-31 ENCOUNTER — Telehealth: Payer: Self-pay

## 2012-05-31 NOTE — Telephone Encounter (Signed)
[facility] program notified of order for patient  Ventolin Inhaler 2 puff Q 4 hour PRN.

## 2012-05-31 NOTE — Telephone Encounter (Signed)
[facility] called regarding pt ventolin rx. They would like to know if ok to use directions of 1-2 puffs every 4-6 hrs (12puffs/ 24hrs) to process script . Thanks  Please use ref# [id number] when calling back

## 2012-05-31 NOTE — Telephone Encounter (Signed)
2 puffs q 4 prn

## 2012-06-08 ENCOUNTER — Encounter: Payer: Self-pay | Admitting: Internal Medicine

## 2012-06-08 ENCOUNTER — Ambulatory Visit (INDEPENDENT_AMBULATORY_CARE_PROVIDER_SITE_OTHER): Payer: PRIVATE HEALTH INSURANCE | Admitting: Internal Medicine

## 2012-06-08 VITALS — BP 128/88 | HR 90 | Temp 98.5°F | Resp 16 | Wt 157.0 lb

## 2012-06-08 DIAGNOSIS — M2669 Other specified disorders of temporomandibular joint: Secondary | ICD-10-CM

## 2012-06-08 NOTE — Assessment & Plan Note (Signed)
In patient with h/o bruxism the on-set of left facial pain in a left TMJ,parietal, occipital and cervical distribution c/w TMJ inflammation.  Plan Continue bite block; small bites and less "jawing."  Lineament of choice to external aspect of left TMJ  Ibuprofen 800 mg tid or 600 mg qid - GI precautions given  Discuss with dentist.

## 2012-06-08 NOTE — Progress Notes (Signed)
Subjective:    Patient ID: [name], female    DOB: [birth year], 46 y.o.   MRN: [id number]  HPI [name] presents for a several day h/o pain that starts at the left occiput/superior posterior neck and radiates forward to the post auricular, left templar area and left neck. This is NOT like her usual menstrual HA. There is no photophobia, scotoma, N/V or other migraine symptoms. She does report temporary relief of symptoms with ibuprofen 800 mg. She does wear a bite block for bruxism. No fever, sweats or chills, no mental status changes, no facial paresthesia.  Past Medical History  Diagnosis Date  . ALLERGIC RHINITIS 10/08/2007  . ASTHMA 04/22/2007  . GERD 04/22/2007  . Headache 10/08/2007  . HYPERTENSION 04/22/2007  . HYPERLIPIDEMIA 04/22/2007  . HYPERTENSION 04/22/2007  . LOW BACK PAIN 06/27/2008  . LUQ PAIN 09/06/2010  . NAUSEA 09/06/2010  . IBS (irritable bowel syndrome)     constipation predominant   Past Surgical History  Procedure Date  . Knee surgery 92, 96, 01, 08    right  . Knee surgery 88    left  . Nasal polyp surgery 95    sinus  . Lumbar fusion 05    L4-5 S1  . Tubal ligation   . Cesarean section    Family History  Problem Relation Age of Onset  . Hypertension Mother   . Arthritis Mother     OA s/p bilateral hip replacement  . Diabetes Father   . Heart disease Father     CAD  . Hyperlipidemia Father   . Hypertension Father   . Pulmonary fibrosis Father   . Osteopenia Sister     older with GI problems s/p colectomy  . Cancer Maternal Grandmother     Ovarian Cancer  . Osteopenia Sister    History   Social History  . Marital Status: Married    Spouse Name: N/A    Number of Children: 2  . Years of Education: 16   Occupational History  . substitute teacher [employer]   Social History Main Topics  . Smoking status: Never Smoker   . Smokeless tobacco: Never Used  . Alcohol Use: No  . Drug Use: No  . Sexually Active: Yes -- Female  partner(s)   Other Topics Concern  . Not on file   Social History Narrative   [employer]. Married 96'. 1 son '98, 1 daughter '08. 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 SO in good health. Work - Photographer - data and Naval architect. Daily Caffeine Use    Current Outpatient Prescriptions on File Prior to Visit  Medication Sig Dispense Refill  . albuterol (VENTOLIN HFA) 108 (90 BASE) MCG/ACT inhaler Inhale 2 puffs into the lungs 4 (four) times daily as needed.  1 each  3  . butalbital-aspirin-caffeine (BUTALBITAL COMPOUND/ASA) 50-325-40 MG per capsule Take 1 capsule by mouth as needed. For migraines  14 capsule  5  . cyclobenzaprine (FLEXERIL) 10 MG tablet TAKE 1 TABLET BY MOUTH 3 TIMES DAILY AS NEEDED FOR MUSCLE SPASM  90 tablet  1  . fluocinonide cream (LIDEX) 0.05 % Apply topically 2 (two) times daily.  60 g  6  . hydrocortisone-pramoxine (ANALPRAM-HC) 2.5-1 % rectal cream Place rectally as needed.        . lovastatin (MEVACOR) 40 MG tablet Take 1 tablet (40 mg  total) by mouth at bedtime.  30 tablet  11  . montelukast (SINGULAIR) 10 MG tablet Take 1 tablet (10 mg total) by mouth daily.  30 tablet  4  . Multiple Vitamin (MULTIVITAMIN) tablet Take 1 tablet by mouth daily.        Marland Kitchen omeprazole-sodium bicarbonate (ZEGERID) 40-1100 MG per capsule Take 1 capsule by mouth daily.        . promethazine (PHENERGAN) 12.5 MG tablet Take 12.5 mg by mouth every 6 (six) hours as needed.        . cetirizine-pseudoephedrine (ZYRTEC-D) 5-120 MG per tablet Take 1 tablet by mouth daily.        Marland Kitchen dicyclomine (BENTYL) 10 MG capsule Take 10 mg by mouth 2 (two) times daily as needed. For cramps and spasms       . lisinopril-hydrochlorothiazide (PRINZIDE,ZESTORETIC) 10-12.5 MG per tablet Take 1 tablet by mouth daily.  90 tablet  3  . predniSONE (DELTASONE) 10 MG tablet Take 1 tablet (10 mg total) by mouth daily. 3 tabs  daily for 3 days; 2 tabs daily for 3 days; 1 tab daily for 6 days  21 tablet  0      Review of Systems System review is negative for any constitutional, cardiac, pulmonary, GI or neuro symptoms or complaints other than as described in the HPI.     Objective:   Physical Exam Filed Vitals:   06/08/12 1634  BP: 128/88  Pulse: 90  Temp: 98.5 F (36.9 C)  Resp: 16   Wt Readings from Last 3 Encounters:  06/08/12 157 lb (71.215 kg)  11/26/11 150 lb 8 oz (68.266 kg)  11/17/11 152 lb 6 oz (69.117 kg)   Gen'l - WNWD white woman in no distress HEENT_ [place]/AT, C&S clear, crepitus and tenderness to palpation over the left TMJ, very tender to pressure against the TMJ from inside the ear canal. Cor- RRR PUlm - normal respirations Neuro - A&O x 3, CN II=XII grossly normal, normal gait and station.        Assessment & Plan:

## 2012-06-08 NOTE — Patient Instructions (Addendum)
Symptoms are very consistent with TMJ, a second possibility is tension headache. On exam there is crepitus and tenderness in the joint.  Plan  Use aspercreme over the external aspect of the joint  Cut food into small pieces - minimize "jawing."  Ibuprofen 800  - 4 x 200 mg advil, etc, every 8 hours or 600 mg every 6 hours  Continue with the bite guard.    Temporomandibular Problems   Temporomandibular joint (TMJ) dysfunction means there are problems with the joint between your jaw and your skull. This is a joint lined by cartilage like other joints in your body but also has a small disc in the joint which keeps the bones from rubbing on each other. These joints are like other joints and can get inflamed (sore) from arthritis and other problems. When this joint gets sore, it can cause headaches and pain in the jaw and the face. CAUSES   Usually the arthritic types of problems are caused by soreness in the joint. Soreness in the joint can also be caused by overuse. This may come from grinding your teeth. It may also come from mis-alignment in the joint. DIAGNOSIS Diagnosis of this condition can often be made by history and exam. Sometimes your caregiver may need X-rays or an MRI scan to determine the exact cause. It may be necessary to see your dentist to determine if your teeth and jaws are lined up correctly. TREATMENT   Most of the time this problem is not serious; however, sometimes it can persist (become chronic). When this happens medications that will cut down on inflammation (soreness) help. Sometimes a shot of cortisone into the joint will be helpful. If your teeth are not aligned it may help for your dentist to make a splint for your mouth that can help this problem. If no physical problems can be found, the problem may come from tension. If tension is found to be the cause, biofeedback or relaxation techniques may be helpful. HOME CARE INSTRUCTIONS    Later in the day, applications of ice  packs may be helpful. Ice can be used in a plastic bag with a towel around it to prevent frostbite to skin. This may be used about every 2 hours for 20 to 30 minutes, as needed while awake, or as directed by your caregiver.   Only take over-the-counter or prescription medicines for pain, discomfort, or fever as directed by your caregiver.   If physical therapy was prescribed, follow your caregiver's directions.   Wear mouth appliances as directed if they were given.  Document Released: 06/17/2001 Document Revised: 09/11/2011 Document Reviewed: 09/24/2008 [facility] Patient Information 2012 [place], [place].

## 2012-06-17 ENCOUNTER — Telehealth: Payer: Self-pay | Admitting: Internal Medicine

## 2012-06-17 NOTE — Telephone Encounter (Signed)
Forward 3 pages from [facility] to Dr. Illene Regulus for review on 06-17-12 ym

## 2012-07-13 ENCOUNTER — Other Ambulatory Visit: Payer: Self-pay | Admitting: *Deleted

## 2012-07-13 MED ORDER — BUTALBITAL-ASPIRIN-CAFFEINE 50-325-40 MG PO CAPS: 1.0000 | ORAL_CAPSULE | ORAL | Status: DC | PRN

## 2012-07-28 ENCOUNTER — Other Ambulatory Visit: Payer: Self-pay

## 2012-07-28 MED ORDER — CYCLOBENZAPRINE HCL 10 MG PO TABS: 10.0000 mg | ORAL_TABLET | Freq: Three times a day (TID) | ORAL | Status: DC | PRN

## 2012-08-27 ENCOUNTER — Ambulatory Visit (INDEPENDENT_AMBULATORY_CARE_PROVIDER_SITE_OTHER): Payer: PRIVATE HEALTH INSURANCE | Admitting: Internal Medicine

## 2012-08-27 ENCOUNTER — Encounter: Payer: Self-pay | Admitting: Internal Medicine

## 2012-08-27 VITALS — BP 120/82 | HR 105 | Temp 97.0°F | Resp 14 | Ht 64.0 in | Wt 155.2 lb

## 2012-08-27 DIAGNOSIS — R7309 Other abnormal glucose: Secondary | ICD-10-CM

## 2012-08-27 DIAGNOSIS — I10 Essential (primary) hypertension: Secondary | ICD-10-CM

## 2012-08-27 DIAGNOSIS — J019 Acute sinusitis, unspecified: Secondary | ICD-10-CM | POA: Insufficient documentation

## 2012-08-27 MED ORDER — HYDROCODONE-HOMATROPINE 5-1.5 MG/5ML PO SYRP: 5.0000 mL | ORAL_SOLUTION | Freq: Four times a day (QID) | ORAL | Status: DC | PRN

## 2012-08-27 MED ORDER — LEVOFLOXACIN 250 MG PO TABS: 250.0000 mg | ORAL_TABLET | Freq: Every day | ORAL | Status: DC

## 2012-08-27 NOTE — Progress Notes (Signed)
Subjective:    Patient ID: [name], female    DOB: Oct 17, 1965, 46 y.o.   MRN: [id number]  HPI   Here with 3 days acute onset fever, facial pain, pressure, general weakness and malaise, and greenish d/c, with slight ST, but little to no cough and Pt denies chest pain, increased sob or doe, wheezing, orthopnea, PND, increased LE swelling, palpitations, dizziness or syncope.   Pt denies polydipsia, polyuria,   Pt states overall good compliance with meds,  wt overall stable but little exercise however.   Pt denies new neurological symptoms such as new headache, or facial or extremity weakness or numbness Past Medical History  Diagnosis Date  . ALLERGIC RHINITIS 10/08/2007  . ASTHMA 04/22/2007  . GERD 04/22/2007  . Headache 10/08/2007  . HYPERTENSION 04/22/2007  . HYPERLIPIDEMIA 04/22/2007  . HYPERTENSION 04/22/2007  . LOW BACK PAIN 06/27/2008  . LUQ PAIN 09/06/2010  . NAUSEA 09/06/2010  . IBS (irritable bowel syndrome)     constipation predominant   Past Surgical History  Procedure Date  . Knee surgery 92, 96, 01, 08    right  . Knee surgery 88    left  . Nasal polyp surgery 95    sinus  . Lumbar fusion 05    L4-5 S1  . Tubal ligation   . Cesarean section     reports that she has never smoked. She has never used smokeless tobacco. She reports that she does not drink alcohol or use illicit drugs. family history includes Arthritis in her mother; Cancer in her maternal grandmother; Diabetes in her father; Heart disease in her father; Hyperlipidemia in her father; Hypertension in her father and mother; Osteopenia in her sisters; and Pulmonary fibrosis in her father. Allergies  Allergen Reactions  . Penicillins    Current Outpatient Prescriptions on File Prior to Visit  Medication Sig Dispense Refill  . albuterol (VENTOLIN HFA) 108 (90 BASE) MCG/ACT inhaler Inhale 2 puffs into the lungs 4 (four) times daily as needed.  1 each  3  . butalbital-aspirin-caffeine (BUTALBITAL COMPOUND/ASA)  50-325-40 MG per capsule Take 1 capsule by mouth as needed. For migraines  14 capsule  5  . cetirizine-pseudoephedrine (ZYRTEC-D) 5-120 MG per tablet Take 1 tablet by mouth daily.        . cyclobenzaprine (FLEXERIL) 10 MG tablet Take 1 tablet (10 mg total) by mouth 3 (three) times daily as needed for muscle spasms.  90 tablet  1  . dicyclomine (BENTYL) 10 MG capsule Take 10 mg by mouth 2 (two) times daily as needed. For cramps and spasms       . fluocinonide cream (LIDEX) 0.05 % Apply topically 2 (two) times daily.  60 g  6  . hydrocortisone-pramoxine (ANALPRAM-HC) 2.5-1 % rectal cream Place rectally as needed.        Marland Kitchen lisinopril-hydrochlorothiazide (PRINZIDE,ZESTORETIC) 10-12.5 MG per tablet Take 1 tablet by mouth daily.  90 tablet  3  . lovastatin (MEVACOR) 40 MG tablet Take 1 tablet (40 mg total) by mouth at bedtime.  30 tablet  11  . montelukast (SINGULAIR) 10 MG tablet Take 1 tablet (10 mg total) by mouth daily.  30 tablet  4  . Multiple Vitamin (MULTIVITAMIN) tablet Take 1 tablet by mouth daily.        Marland Kitchen omeprazole-sodium bicarbonate (ZEGERID) 40-1100 MG per capsule Take 1 capsule by mouth daily.        . promethazine (PHENERGAN) 12.5 MG tablet Take 12.5 mg by mouth every 6 (  six) hours as needed.        . predniSONE (DELTASONE) 10 MG tablet Take 1 tablet (10 mg total) by mouth daily. 3 tabs daily for 3 days; 2 tabs daily for 3 days; 1 tab daily for 6 days  21 tablet  0   Review of Systems  Constitutional: Negative for diaphoresis and unexpected weight change.  HENT: Negative for tinnitus.   Eyes: Negative for photophobia and visual disturbance.  Respiratory: Negative for choking and stridor.   Gastrointestinal: Negative for vomiting and blood in stool.  Genitourinary: Negative for hematuria and decreased urine volume.  Musculoskeletal: Negative for gait problem.  Skin: Negative for color change and wound.  Neurological: Negative for tremors and numbness.  Psychiatric/Behavioral:  Negative for decreased concentration. The patient is not hyperactive.       Objective:   Physical Exam BP 120/82  Pulse 105  Temp 97 F (36.1 C) (Oral)  Resp 14  Ht 5\' 4"  (1.626 m)  Wt 155 lb 3.2 oz (70.398 kg)  BMI 26.64 kg/m2  SpO2 99% Physical Exam  VS noted, mild ill Constitutional: Pt appears well-developed and well-nourished.  HENT: Head: Normocephalic.  Right Ear: External ear normal.  Left Ear: External ear normal.  Bilat tm's mild erythema.  Sinus tender.  Pharynx mild erythema Eyes: Conjunctivae and EOM are normal. Pupils are equal, round, and reactive to light.  Neck: Normal range of motion. Neck supple.  Cardiovascular: Normal rate and regular rhythm.   Pulmonary/Chest: Effort normal and breath sounds normal.  Neurological: Pt is alert. Not confused  Skin: Skin is warm. No erythema.  Psychiatric: Pt behavior is normal. Thought content normal. mild nervous    Assessment & Plan:

## 2012-08-27 NOTE — Assessment & Plan Note (Signed)
Mild to mod, for antibx course,  to f/u any worsening symptoms or concerns 

## 2012-08-27 NOTE — Patient Instructions (Addendum)
Take all new medications as prescribed Continue all other medications as before Thank you for enrolling in MyChart. Please follow the instructions below to securely access your online medical record. MyChart allows you to send messages to your doctor, view your test results, renew your prescriptions, schedule appointments, and more. To Log into MyChart, please go to [email or web address], and your Username is: [phone]

## 2012-08-27 NOTE — Assessment & Plan Note (Signed)
stable overall by hx and exam, most recent data reviewed with pt, and pt to continue medical treatment as before BP Readings from Last 3 Encounters:  06/08/12 128/88  11/26/11 98/70  11/17/11 98/68

## 2012-08-27 NOTE — Assessment & Plan Note (Signed)
stable overall by hx and exam, most recent data reviewed with pt, and pt to continue medical treatment as before Lab Results  Component Value Date   HGBA1C 6.5* 01/07/2008

## 2012-09-14 ENCOUNTER — Other Ambulatory Visit: Payer: Self-pay | Admitting: Internal Medicine

## 2012-09-15 ENCOUNTER — Other Ambulatory Visit: Payer: Self-pay | Admitting: *Deleted

## 2012-09-15 MED ORDER — CYCLOBENZAPRINE HCL 10 MG PO TABS: 10.0000 mg | ORAL_TABLET | Freq: Three times a day (TID) | ORAL | Status: DC | PRN

## 2012-10-05 ENCOUNTER — Ambulatory Visit (INDEPENDENT_AMBULATORY_CARE_PROVIDER_SITE_OTHER): Payer: PRIVATE HEALTH INSURANCE | Admitting: Internal Medicine

## 2012-10-05 ENCOUNTER — Encounter: Payer: Self-pay | Admitting: Internal Medicine

## 2012-10-05 ENCOUNTER — Telehealth: Payer: Self-pay | Admitting: Internal Medicine

## 2012-10-05 VITALS — BP 130/82 | HR 99 | Temp 98.0°F | Resp 10 | Wt 153.1 lb

## 2012-10-05 DIAGNOSIS — J019 Acute sinusitis, unspecified: Secondary | ICD-10-CM

## 2012-10-05 MED ORDER — CIPROFLOXACIN HCL 500 MG PO TABS: 500.0000 mg | ORAL_TABLET | Freq: Two times a day (BID) | ORAL | Status: DC

## 2012-10-05 NOTE — Telephone Encounter (Signed)
Called to schedule pt at 4:00 today when that slot opened.

## 2012-10-05 NOTE — Patient Instructions (Addendum)
Recurrent sinus infection. Although other flouroquinolones (levaquin and Avelox) are recommended ciprofloxacin is in the same class and worth a try - cipro 500 mg twice a day x 10 days and the usual supportive care.  If you get another sinus infection in the next couple of months will need a CT of the sinus to rule out chronic sinusitis with mucosal thickening and need for stripping.  Have a good New Year - watch the [employer] with a new Dickie La

## 2012-10-05 NOTE — Telephone Encounter (Signed)
Pt would like to come in or have something called in.  She has congestion, sore throat, sinus congestion. Pharmacy is [facility] [address] [facility] [address].  They have cipro at no co-pay.

## 2012-10-06 NOTE — Progress Notes (Signed)
  Subjective:    Patient ID: [name], female    DOB: [birth year], 47 y.o.   MRN: [id number]  HPI Mrs Guardiola was seen and treated for sinus infection in November by Dr. Jonny Ruiz. Levaquin was too expensive so an Rx for another product was called in for her - Zpak. She reports that her symtoms never really cleared and over the last 48 hrs her symptoms are worse: fever, sinus pressure, rhinorrhea with purulent drainage. No SOB, no N/D.  PMH, FamHx and SocHx reviewed for any changes and relevance. Current Outpatient Prescriptions on File Prior to Visit  Medication Sig Dispense Refill  . albuterol (VENTOLIN HFA) 108 (90 BASE) MCG/ACT inhaler Inhale 2 puffs into the lungs 4 (four) times daily as needed.  1 each  3  . butalbital-aspirin-caffeine (BUTALBITAL COMPOUND/ASA) 50-325-40 MG per capsule Take 1 capsule by mouth as needed. For migraines  14 capsule  5  . cetirizine-pseudoephedrine (ZYRTEC-D) 5-120 MG per tablet Take 1 tablet by mouth daily.        . cyclobenzaprine (FLEXERIL) 10 MG tablet Take 1 tablet (10 mg total) by mouth 3 (three) times daily as needed for muscle spasms.  90 tablet  1  . fluocinonide cream (LIDEX) 0.05 % Apply topically 2 (two) times daily.  60 g  6  . HYDROcodone-homatropine (HYCODAN) 5-1.5 MG/5ML syrup Take 5 mLs by mouth every 6 (six) hours as needed for cough.  120 mL  1  . hydrocortisone-pramoxine (ANALPRAM-HC) 2.5-1 % rectal cream Place rectally as needed.        Marland Kitchen levofloxacin (LEVAQUIN) 250 MG tablet Take 1 tablet (250 mg total) by mouth daily.  10 tablet  0  . lisinopril-hydrochlorothiazide (PRINZIDE,ZESTORETIC) 10-12.5 MG per tablet Take 1 tablet by mouth daily.  90 tablet  3  . lovastatin (MEVACOR) 40 MG tablet Take 1 tablet (40 mg total) by mouth at bedtime.  30 tablet  11  . montelukast (SINGULAIR) 10 MG tablet Take 1 tablet (10 mg total) by mouth daily.  30 tablet  4  . Multiple Vitamin (MULTIVITAMIN) tablet Take 1 tablet by mouth daily.        Marland Kitchen  omeprazole-sodium bicarbonate (ZEGERID) 40-1100 MG per capsule Take 1 capsule by mouth daily.        . predniSONE (DELTASONE) 10 MG tablet Take 1 tablet (10 mg total) by mouth daily. 3 tabs daily for 3 days; 2 tabs daily for 3 days; 1 tab daily for 6 days  21 tablet  0  . promethazine (PHENERGAN) 12.5 MG tablet Take 12.5 mg by mouth every 6 (six) hours as needed.        . dicyclomine (BENTYL) 10 MG capsule Take 10 mg by mouth 2 (two) times daily as needed. For cramps and spasms           Review of Systems System review is negative for any constitutional, cardiac, pulmonary, GI or neuro symptoms or complaints other than as described in the HPI.     Objective:   Physical Exam Filed Vitals:   10/05/12 1604  BP: 130/82  Pulse: 99  Temp: 98 F (36.7 C)  Resp: 10   Gen'l- WNWD white woman in no distress HEENT- tender to percussion frontal sinuses Cor - RRR Pulm - lungs are CTAP       Assessment & Plan:

## 2012-10-06 NOTE — Assessment & Plan Note (Addendum)
Recurrent sinus infection. Although other flouroquinolones (levaquin and Avelox) are recommended ciprofloxacin is in the same class and worth a try - cipro 500 mg twice a day x 10 days and the usual supportive care.  If you get another sinus infection in the next couple of months will need a CT of the sinus to rule out chronic sinusitis with mucosal thickening and need for stripping.

## 2012-11-01 ENCOUNTER — Telehealth: Payer: Self-pay | Admitting: Internal Medicine

## 2012-11-01 ENCOUNTER — Other Ambulatory Visit: Payer: Self-pay | Admitting: Internal Medicine

## 2012-11-01 MED ORDER — OSELTAMIVIR PHOSPHATE 75 MG PO CAPS: 75.0000 mg | ORAL_CAPSULE | Freq: Every day | ORAL | Status: DC

## 2012-11-01 MED ORDER — PREDNISONE 20 MG PO TABS: 20.0000 mg | ORAL_TABLET | Freq: Every day | ORAL | Status: DC

## 2012-11-01 NOTE — Telephone Encounter (Signed)
[facility] Triage Call Report Triage Record Num: [id number] Operator: Reino Bellis Patient Name: [name] Call Date & Time: 10/29/2012 6:22:15PM Patient Phone: [phone] PCP: Illene Regulus Patient Gender: Female PCP Fax : [phone] Patient DOB: 03/16/1966 [facility] Name[facility] Reason for Call: Caller: [facility]/Patient; PCP: Illene Regulus (Adults only); CB#: [phone]; Call regarding Asthma; Pt. calling about difficulty breathing d/t asthma. Onset: 10/27/12. Pt. reports she has been taking breathing treatments (Albuterol) and Ventolin Inhaler with mild relief. Emergent sxs of short of breath (only able to speak a few words or phrases between breaths, cannot do usual activities) and rescue meds have not helped, OR symptoms are same or worsening after 24 hours positive per Asthma - Adult Protocol. Advised pt. should go to ED now for evaluation. Pt. reports she can not go to ED now, but request Prednisone for relief of symptoms. Pt. advised no RX for Prednisone can be called in and enc. to go to ED to be evaluated. Protocol(s) Used: Asthma - Adult Recommended Outcome per Protocol: See ED Immediately Reason for Outcome: Short of breath (only able to speak a few words or phrases between breaths, cannot do usual activities) AND rescue meds have not helped, OR symptoms are same or worsening after 24 hours

## 2012-11-01 NOTE — Telephone Encounter (Signed)
Called patient. She took prednisone 60 mg Friday, 40 mg Sat and Sun. Plan- 40 mg today, 20 mg x 6 days  Son diagnosed with influenza. Plan - [place]fu 75 mg daily x 10

## 2012-11-24 ENCOUNTER — Ambulatory Visit (INDEPENDENT_AMBULATORY_CARE_PROVIDER_SITE_OTHER): Payer: PRIVATE HEALTH INSURANCE | Admitting: Internal Medicine

## 2012-11-24 ENCOUNTER — Encounter: Payer: Self-pay | Admitting: Internal Medicine

## 2012-11-24 VITALS — BP 128/68 | HR 112 | Temp 98.3°F | Resp 12 | Ht 63.0 in | Wt 154.1 lb

## 2012-11-24 DIAGNOSIS — I10 Essential (primary) hypertension: Secondary | ICD-10-CM

## 2012-11-24 DIAGNOSIS — J309 Allergic rhinitis, unspecified: Secondary | ICD-10-CM

## 2012-11-24 DIAGNOSIS — J45909 Unspecified asthma, uncomplicated: Secondary | ICD-10-CM

## 2012-11-24 DIAGNOSIS — E785 Hyperlipidemia, unspecified: Secondary | ICD-10-CM

## 2012-11-24 DIAGNOSIS — Z0000 Encounter for general adult medical examination without abnormal findings: Secondary | ICD-10-CM

## 2012-11-24 MED ORDER — METFORMIN HCL 500 MG PO TABS: 500.0000 mg | ORAL_TABLET | Freq: Two times a day (BID) | ORAL | Status: DC

## 2012-11-24 NOTE — Progress Notes (Signed)
Subjective:    Patient ID: [name], female    DOB: 31-Jul-1966, 47 y.o.   MRN: [id number]  HPI Presents for general exam. She has general been doing OK. She has had usual asthma/allergy type symptoms. No major illnes, surgery or injury. Home monitoring CBGs have been elevated and A1C via [facility] - last value in January was 7.6%! She is attending diabetes class.   Past Medical History  Diagnosis Date  . ALLERGIC RHINITIS 10/08/2007  . ASTHMA 04/22/2007  . GERD 04/22/2007  . Headache 10/08/2007  . HYPERTENSION 04/22/2007  . HYPERLIPIDEMIA 04/22/2007  . HYPERTENSION 04/22/2007  . LOW BACK PAIN 06/27/2008  . LUQ PAIN 09/06/2010  . NAUSEA 09/06/2010  . IBS (irritable bowel syndrome)     constipation predominant   Past Surgical History  Procedure Laterality Date  . Knee surgery  92, 96, 01, 08    right  . Knee surgery  88    left  . Nasal polyp surgery  95    sinus  . Lumbar fusion  05    L4-5 S1  . Tubal ligation    . Cesarean section     Family History  Problem Relation Age of Onset  . Hypertension Mother   . Arthritis Mother     OA s/p bilateral hip replacement  . Diabetes Father   . Heart disease Father     CAD  . Hyperlipidemia Father   . Hypertension Father   . Pulmonary fibrosis Father   . Osteopenia Sister     older with GI problems s/p colectomy  . Cancer Maternal Grandmother     Ovarian Cancer  . Osteopenia Sister    History   Social History  . Marital Status: Married    Spouse Name: N/A    Number of Children: 2  . Years of Education: 16   Occupational History  . substitute teacher [employer]   Social History Main Topics  . Smoking status: Never Smoker   . Smokeless tobacco: Never Used  . Alcohol Use: No  . Drug Use: No  . Sexually Active: Yes -- Female partner(s)   Other Topics Concern  . Not on file   Social History Narrative   [employer]. Married 96'. 1 son '98, 1 daughter '08. Two part-time jobs: Engineer, manufacturing and church work - 2  days a week, 2 nights a week.Also at home mom with a w/c bound son. SO in good health. Work - Photographer - data and Naval architect. Daily Caffeine Use    Current Outpatient Prescriptions on File Prior to Visit  Medication Sig Dispense Refill  . albuterol (VENTOLIN HFA) 108 (90 BASE) MCG/ACT inhaler Inhale 2 puffs into the lungs 4 (four) times daily as needed.  1 each  3  . butalbital-aspirin-caffeine (BUTALBITAL COMPOUND/ASA) 50-325-40 MG per capsule Take 1 capsule by mouth as needed. For migraines  14 capsule  5  . cetirizine-pseudoephedrine (ZYRTEC-D) 5-120 MG per tablet Take 1 tablet by mouth daily.        . ciprofloxacin (CIPRO) 500 MG tablet Take 1 tablet (500 mg total) by mouth 2 (two) times daily.  20 tablet  0  . cyclobenzaprine (FLEXERIL) 10 MG tablet Take 1 tablet (10 mg total) by mouth 3 (three) times daily as needed for muscle spasms.  90 tablet  1  . dicyclomine (BENTYL) 10 MG capsule Take 10 mg by mouth 2 (two) times daily as needed. For cramps and spasms       .  fluocinonide cream (LIDEX) 0.05 % Apply topically 2 (two) times daily.  60 g  6  . HYDROcodone-homatropine (HYCODAN) 5-1.5 MG/5ML syrup Take 5 mLs by mouth every 6 (six) hours as needed for cough.  120 mL  1  . hydrocortisone-pramoxine (ANALPRAM-HC) 2.5-1 % rectal cream Place rectally as needed.        Marland Kitchen levofloxacin (LEVAQUIN) 250 MG tablet Take 1 tablet (250 mg total) by mouth daily.  10 tablet  0  . lisinopril-hydrochlorothiazide (PRINZIDE,ZESTORETIC) 10-12.5 MG per tablet Take 1 tablet by mouth daily.  90 tablet  3  . lovastatin (MEVACOR) 40 MG tablet Take 1 tablet (40 mg total) by mouth at bedtime.  30 tablet  11  . montelukast (SINGULAIR) 10 MG tablet Take 1 tablet (10 mg total) by mouth daily.  30 tablet  4  . Multiple Vitamin (MULTIVITAMIN) tablet Take 1 tablet by mouth daily.        Marland Kitchen omeprazole-sodium bicarbonate (ZEGERID) 40-1100 MG per capsule Take 1 capsule by mouth daily.        Marland Kitchen oseltamivir  (TAMIFLU) 75 MG capsule Take 1 capsule (75 mg total) by mouth daily.  10 capsule  0  . predniSONE (DELTASONE) 10 MG tablet Take 1 tablet (10 mg total) by mouth daily. 3 tabs daily for 3 days; 2 tabs daily for 3 days; 1 tab daily for 6 days  21 tablet  0  . predniSONE (DELTASONE) 20 MG tablet Take 1 tablet (20 mg total) by mouth daily. 4 today, 1 daily times 6 days  30 tablet  0  . promethazine (PHENERGAN) 12.5 MG tablet Take 12.5 mg by mouth every 6 (six) hours as needed.         No current facility-administered medications on file prior to visit.      Review of Systems Constitutional:  Negative for fever, chills, activity change and unexpected weight change.  HEENT:  Negative for hearing loss, ear pain, congestion, neck stiffness and postnasal drip. Negative for sore throat or swallowing problems. Negative for dental complaints.   Eyes: Negative for vision loss or change in visual acuity.  Respiratory: Negative for chest tightness and wheezing. Negative for DOE.   Cardiovascular: Negative for chest pain or palpitations. No decreased exercise tolerance Gastrointestinal: No change in bowel habit. No bloating or gas. More reflux or indigestion, especially if she misses Zegrid.  Genitourinary: Negative for urgency, frequency, flank pain and difficulty urinating.  Musculoskeletal: Negative for myalgias, back pain, arthralgias and gait problem.  Neurological: Negative for dizziness, tremors, weakness and headaches.  Hematological: Negative for adenopathy.  Psychiatric/Behavioral: Negative for behavioral problems and dysphoric mood.       Objective:   Physical Exam Filed Vitals:   11/24/12 0932  BP: 128/68  Pulse: 112  Temp: 98.3 F (36.8 C)  Resp: 12   Wt Readings from Last 3 Encounters:  11/24/12 154 lb 1.9 oz (69.908 kg)  10/05/12 153 lb 1.3 oz (69.437 kg)  08/27/12 155 lb 3.2 oz (70.398 kg)   Gen'l: well nourished, well developed white Woman in no distress HEENT - [place]/AT,  EACs/TMs normal, oropharynx with native dentition in good condition, no buccal or palatal lesions, posterior pharynx clear, mucous membranes moist. C&S clear, PERRLA, fundi - normal Neck - supple, no thyromegaly Nodes- negative submental, cervical, supraclavicular regions Chest - no deformity, no CVAT Lungs - clear without rales, wheezes. No increased work of breathing Breast - - Skin normal, nipples w/o discharge, no fixed mass or lesion, no axillary  adenopathy. Cardiovascular - regular rate and rhythm, quiet precordium, no murmurs, rubs or gallops, 2+ radial, DP and PT pulses Abdomen - BS+ x 4, no HSM, no guarding or rebound or tenderness Pelvic - deferred - due 2015 Rectal - deferred  Extremities - no clubbing, cyanosis, edema or deformity.  Neuro - A&O x 3, CN II-XII normal, motor strength normal and equal, DTRs 2+ and symmetrical biceps, radial, and patellar tendons. Cerebellar - no tremor, no rigidity, fluid movement and normal gait. Foot exam - normal sensation to light-touch, pin-prick, vibration Derm - Head, neck, back, abdomen and extremities without suspicious lesions   Labs pending        Assessment & Plan:

## 2012-11-24 NOTE — Patient Instructions (Addendum)
Thanks for coming to see me. Sorry you had a little event this morning.  Your exam is normal!  Diabetes - A1C too high - it is time to start treatment: metformin 500 mg twice a day. For the first week take the metformin once a day.        Life style management is the most important: no sugar, low carb, regular exercise       Need a repeat A1C in 3 months  Health maintenance is current and up to date.  I will see ya when I see ya.  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

## 2012-11-25 NOTE — Assessment & Plan Note (Signed)
Chronic problem that is well controlled most of the time.

## 2012-11-25 NOTE — Assessment & Plan Note (Signed)
Tolerating medication well.  Plan  Follow up lab with recommendations to follow.   Addendum: LDL better than goal of 130 or less; HDL better than goal of 40+. Liver functions are normal Plan - continue present medication

## 2012-11-25 NOTE — Assessment & Plan Note (Signed)
BP Readings from Last 3 Encounters:  11/24/12 128/68  10/05/12 130/82  08/27/12 120/82   Lab - normal kidney function and electrolytes  Plan Continue present medication

## 2012-11-25 NOTE — Assessment & Plan Note (Signed)
Doing well most of the time. Using rescue medication several times a week.  Plan Continue all present medications.

## 2012-11-25 NOTE — Assessment & Plan Note (Signed)
CBGs have been trending upward. A1C - done thru [facility] - was 7.6% in February '14  Plan Continued life-style management: No sugar and low carb diet. Continue diabetes class thru [facility]  Metformin 500 mg twice a day  Follow up A1c in 3 months.

## 2012-11-25 NOTE — Assessment & Plan Note (Signed)
Interval history - occasional URI but no major illness, surgery or injury. Physical exam is normal. Past and present Lab results reviewed - in normal range.Current with cervical cancer screening - due for PAP in 2015. Normal breast exam. Immunizations are up to date.  In summary - a very nice woman, busy with family and work. She is encouraged to make time to exercise.  Return in 1 year or sooner as needed.

## 2012-12-06 ENCOUNTER — Encounter: Payer: Self-pay | Admitting: Internal Medicine

## 2012-12-21 ENCOUNTER — Other Ambulatory Visit: Payer: Self-pay | Admitting: Internal Medicine

## 2012-12-21 ENCOUNTER — Encounter: Payer: Self-pay | Admitting: Internal Medicine

## 2012-12-21 MED ORDER — GLIMEPIRIDE 2 MG PO TABS: 1.0000 mg | ORAL_TABLET | Freq: Every day | ORAL | Status: DC

## 2012-12-27 ENCOUNTER — Other Ambulatory Visit: Payer: Self-pay | Admitting: Internal Medicine

## 2013-01-10 ENCOUNTER — Other Ambulatory Visit: Payer: Self-pay

## 2013-01-10 ENCOUNTER — Telehealth: Payer: Self-pay

## 2013-01-10 NOTE — Telephone Encounter (Signed)
Left message on machine for pt to return my call  

## 2013-01-10 NOTE — Telephone Encounter (Signed)
Phone call from pt. She only has a 1/2 tab left of the Glimepiride. She states she has been taking a 1/2 tab in the morning and 1/2 tab in the evening. This has been working well for her keeping her blood sugars level. So that is why she is needing a refill early because she is taking one tab instead of 1/2 tablet daily. She would like to know if what she is doing okay with you? Please advise.

## 2013-01-11 MED ORDER — GLIMEPIRIDE 2 MG PO TABS: 2.0000 mg | ORAL_TABLET | Freq: Every day | ORAL | Status: DC

## 2013-01-11 NOTE — Telephone Encounter (Signed)
LVM for pt that rx has been sent to pharmacy and to also call back when she is available.

## 2013-01-11 NOTE — Telephone Encounter (Signed)
Glimepiride is usually a once a day drug: she can take 2 mg as a single dose. If split dosing seems to work better for her that is also OK.  Seeing a list of CBGs readings, viat MyChart, will be helpful.  OK to refill Glimepiride 2 mg # 30 with 3 refills.  A1C June 1st or after - she can do this at [facility] or at home if cheaper but an order is entered.

## 2013-01-12 ENCOUNTER — Telehealth: Payer: Self-pay

## 2013-01-12 ENCOUNTER — Other Ambulatory Visit: Payer: Self-pay | Admitting: Internal Medicine

## 2013-01-12 NOTE — Telephone Encounter (Signed)
Phone call to [facility] on [address] LM for pt's Glimepiride since pharmacy states they did not receive it electronically on 01/10/13

## 2013-02-11 ENCOUNTER — Other Ambulatory Visit (HOSPITAL_COMMUNITY): Payer: Self-pay | Admitting: Orthopaedic Surgery

## 2013-02-22 ENCOUNTER — Telehealth: Payer: Self-pay

## 2013-02-22 NOTE — Telephone Encounter (Signed)
Received fax refill request for butalbital 50-325-40, last filled 07/23/12 #[id number]. Please advise if ok to refill Thanks

## 2013-02-22 NOTE — Telephone Encounter (Signed)
OK to fill this prescription with additional refills x0 Needs OV w/Dr Norins Thank you!

## 2013-02-23 MED ORDER — BUTALBITAL-ASPIRIN-CAFFEINE 50-325-40 MG PO CAPS: 1.0000 | ORAL_CAPSULE | ORAL | Status: DC | PRN

## 2013-02-23 NOTE — Telephone Encounter (Signed)
Called butalbital 50-325-40 mg to pharmacy

## 2013-02-23 NOTE — Telephone Encounter (Signed)
OK to fill this prescription with additional refills x0. OV w/Dr Norins to sch Thank you!

## 2013-02-25 ENCOUNTER — Other Ambulatory Visit: Payer: Self-pay | Admitting: Internal Medicine

## 2013-03-14 ENCOUNTER — Encounter (HOSPITAL_COMMUNITY): Payer: Self-pay

## 2013-03-14 ENCOUNTER — Encounter (HOSPITAL_COMMUNITY)
Admission: RE | Admit: 2013-03-14 | Discharge: 2013-03-14 | Disposition: A | Discharge status: Home / Self Care | Payer: No Typology Code available for payment source | Source: Ambulatory Visit | Attending: Orthopaedic Surgery | Admitting: Orthopaedic Surgery

## 2013-03-14 ENCOUNTER — Encounter (HOSPITAL_COMMUNITY): Payer: Self-pay | Admitting: Pharmacy Technician

## 2013-03-14 ENCOUNTER — Ambulatory Visit (HOSPITAL_COMMUNITY)
Admission: RE | Admit: 2013-03-14 | Discharge: 2013-03-14 | Disposition: A | Discharge status: Home / Self Care | Payer: No Typology Code available for payment source | Source: Ambulatory Visit | Attending: Orthopaedic Surgery | Admitting: Orthopaedic Surgery

## 2013-03-14 DIAGNOSIS — Z01818 Encounter for other preprocedural examination: Secondary | ICD-10-CM | POA: Insufficient documentation

## 2013-03-14 DIAGNOSIS — Z01810 Encounter for preprocedural cardiovascular examination: Secondary | ICD-10-CM | POA: Insufficient documentation

## 2013-03-14 DIAGNOSIS — Z01812 Encounter for preprocedural laboratory examination: Secondary | ICD-10-CM | POA: Insufficient documentation

## 2013-03-14 DIAGNOSIS — E119 Type 2 diabetes mellitus without complications: Secondary | ICD-10-CM | POA: Insufficient documentation

## 2013-03-14 DIAGNOSIS — K219 Gastro-esophageal reflux disease without esophagitis: Secondary | ICD-10-CM | POA: Insufficient documentation

## 2013-03-14 DIAGNOSIS — I10 Essential (primary) hypertension: Secondary | ICD-10-CM | POA: Insufficient documentation

## 2013-03-14 DIAGNOSIS — E785 Hyperlipidemia, unspecified: Secondary | ICD-10-CM | POA: Insufficient documentation

## 2013-03-14 HISTORY — DX: Unspecified osteoarthritis, unspecified site: M19.90

## 2013-03-14 HISTORY — DX: Other specified postprocedural states: Z98.890

## 2013-03-14 HISTORY — DX: Other specified postprocedural states: R11.2

## 2013-03-14 HISTORY — DX: Type 2 diabetes mellitus without complications: E11.9

## 2013-03-14 LAB — CBC
Platelets: 216 10*3/uL (ref 150–400)
RBC: 4.72 MIL/uL (ref 3.87–5.11)
RDW: 17.8 % — ABNORMAL HIGH (ref 11.5–15.5)
WBC: 5.9 10*3/uL (ref 4.0–10.5)

## 2013-03-14 LAB — COMPREHENSIVE METABOLIC PANEL
ALT: 46 U/L — ABNORMAL HIGH (ref 0–35)
AST: 39 U/L — ABNORMAL HIGH (ref 0–37)
Albumin: 4 g/dL (ref 3.5–5.2)
Alkaline Phosphatase: 61 U/L (ref 39–117)
CO2: 27 mEq/L (ref 19–32)
Chloride: 100 mEq/L (ref 96–112)
GFR calc non Af Amer: 85 mL/min — ABNORMAL LOW (ref >90)
Potassium: 3.7 mEq/L (ref 3.5–5.1)
Sodium: 137 mEq/L (ref 135–145)
Total Bilirubin: 0.2 mg/dL — ABNORMAL LOW (ref 0.3–1.2)

## 2013-03-14 LAB — URINALYSIS, ROUTINE W REFLEX MICROSCOPIC
Bilirubin Urine: NEGATIVE
Glucose, UA: NEGATIVE mg/dL
Hgb urine dipstick: NEGATIVE
Ketones, ur: NEGATIVE mg/dL
Protein, ur: NEGATIVE mg/dL

## 2013-03-14 LAB — PROTIME-INR: Prothrombin Time: 12.3 seconds (ref 11.6–15.2)

## 2013-03-14 LAB — SURGICAL PCR SCREEN: Staphylococcus aureus: POSITIVE — AB

## 2013-03-14 LAB — TYPE AND SCREEN
ABO/RH(D): O POS
Antibody Screen: NEGATIVE

## 2013-03-14 MED ORDER — CHLORHEXIDINE GLUCONATE 4 % EX LIQD: 60.0000 mL | Freq: Once | CUTANEOUS | Status: DC

## 2013-03-14 NOTE — H&P (Signed)
TOTAL KNEE ADMISSION H&P  Patient is being admitted for right total knee arthroplasty.  Subjective:  Chief Complaint:right knee pain.  HPI: [name], 47 y.o. female, has a history of pain and functional disability in the right knee due to arthritis and has failed non-surgical conservative treatments for greater than 12 weeks to includeNSAID's and/or analgesics, corticosteriod injections, supervised PT with diminished ADL's post treatment and use of assistive devices.  Onset of symptoms was gradual, starting 4 years ago with gradually worsening course since that time. The patient noted prior procedures on the knee to include  arthroscopy on the right knee(s).  Patient currently rates pain in the right knee(s) at 8 out of 10 with activity. Patient has worsening of pain with activity and weight bearing, pain that interferes with activities of daily living and crepitus.  Patient has evidence of periarticular osteophytes, joint subluxation and joint space narrowing by imaging studies. 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 There is no active infection.  Patient Active Problem List   Diagnosis Date Noted  . Acute sinus infection 08/27/2012  . 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 Routine health maintenance 11/17/2011  . NAUSEA 09/06/2010  . LUQ PAIN 09/06/2010  . Diarrhea 05/25/2009  . LOW BACK PAIN 06/27/2008  . ALLERGIC RHINITIS 10/08/2007  . Headache 10/08/2007  . Type II or unspecified type diabetes mellitus without mention of complication, uncontrolled 10/08/2007  . HYPERLIPIDEMIA 04/22/2007  . HYPERTENSION 04/22/2007  . ASTHMA 04/22/2007  . GERD 04/22/2007   Past Medical History  Diagnosis Date  . ALLERGIC RHINITIS 10/08/2007  . ASTHMA 04/22/2007  . GERD 04/22/2007  . Headache 10/08/2007  . HYPERTENSION 04/22/2007  . HYPERLIPIDEMIA 04/22/2007  . HYPERTENSION 04/22/2007  . LOW BACK PAIN 06/27/2008  . LUQ PAIN 09/06/2010  .  NAUSEA 09/06/2010  . IBS (irritable bowel syndrome)     constipation predominant    Past Surgical History  Procedure Laterality Date  . Knee surgery  92, 96, 01, 08    right  . Knee surgery  88    left  . Nasal polyp surgery  95    sinus  . Lumbar fusion  05    L4-5 S1  . Tubal ligation    . Cesarean section      No prescriptions prior to admission   Allergies  Allergen Reactions  . Penicillins     History  Substance Use Topics  . Smoking status: Never Smoker   . Smokeless tobacco: Never Used  . Alcohol Use: No    Family History  Problem Relation Age of Onset  . Hypertension Mother   . Arthritis Mother     OA s/p bilateral hip replacement  . Diabetes Father   . Heart disease Father     CAD  . Hyperlipidemia Father   . Hypertension Father   . Pulmonary fibrosis Father   . Osteopenia Sister     older with GI problems s/p colectomy  . Cancer Maternal Grandmother     Ovarian Cancer  . Osteopenia Sister      Review of Systems  Constitutional: Negative.   HENT: Negative.   Eyes: Negative.   Respiratory: Negative.   Cardiovascular: Negative.   Gastrointestinal: Negative.   Genitourinary: Negative.   Musculoskeletal: Positive for back pain and joint pain.  Skin: Negative.   Neurological: Negative.   Endo/Heme/Allergies: Negative.   Psychiatric/Behavioral: Negative.     Objective:  Physical Exam  Constitutional: She is oriented to person, place, and time. She appears well-developed and well-nourished.  HENT:  Head: Normocephalic and atraumatic.  Eyes: EOM are normal. Pupils are equal, round, and reactive to light.  Neck: Normal range of motion.  Cardiovascular: Normal rate.   Respiratory: Effort normal.  GI: Soft.  Musculoskeletal:    Hip range of motion is normal.  She is ambulatory with a knee limp and has 17 degrees of valgus deformity.  Palpable osteophytes.  A 3+ knee effusion.  Distal pulses are 2+.  Opposite knee shows no valgus deformity.   Negative sciatic nerve stretch test.  She reaches full extension.  Flexes to 100 degrees.  There is patellofemoral crepitus.  She does still reach full extension.  Opposite left knee shows full flexion past 140 degrees.  Full extension.  Neurological: She is alert and oriented to person, place, and time.  Skin: Skin is warm and dry.  Psychiatric: She has a normal mood and affect.    Vital signs in last 24 hours:    Labs:   Estimated body mass index is 27.31 kg/(m^2) as calculated from the following:   Height as of 11/24/12: 5\' 3"  (1.6 m).   Weight as of 11/24/12: 69.908 kg (154 lb 1.9 oz).   Imaging Review Plain radiographs demonstrate severe degenerative joint disease of the right knee(s). The overall alignment issignificant valgus. The bone quality appears to be adequate for age and reported activity level.  Assessment/Plan:  End stage arthritis, right knee   The patient history, physical examination, clinical judgment of the provider and imaging studies are consistent with end stage degenerative joint disease of the right knee(s) and total knee arthroplasty is deemed medically necessary. The treatment options including medical management, injection therapy arthroscopy and arthroplasty were discussed at length. The risks and benefits of total knee arthroplasty were presented and reviewed. The risks due to aseptic loosening, infection, stiffness, patella tracking problems, thromboembolic complications and other imponderables were discussed. The patient acknowledged the explanation, agreed to proceed with the plan and consent was signed. Patient is being admitted for inpatient treatment for surgery, pain control, PT, OT, prophylactic antibiotics, VTE prophylaxis, progressive ambulation and ADL's and discharge planning. The patient is planning to be discharged home with home health services

## 2013-03-14 NOTE — Pre-Procedure Instructions (Signed)
TEMARI SCHOOLER  03/14/2013   Your procedure is scheduled on:  Monday June 16,2014  Report to [facility] at 1030  AM.  Call this number if you have problems the morning of surgery: [phone]   Remember:   Do not eat food or drink liquids after midnight.   Take these medicines the morning of surgery with A SIP OF WATER: Zyrtec,Singular, and use and bring inhaler with you   Do not wear jewelry, make-up or nail polish.  Do not wear lotions, powders, or perfumes. You may wear deodorant.  Do not shave 48 hours prior to surgery. Men may shave face and neck.  Do not bring valuables to the hospital.  [facility] is not responsible                   for any belongings or valuables.  Contacts, dentures or bridgework may not be worn into surgery.  Leave suitcase in the car. After surgery it may be brought to your room.  For patients admitted to the hospital, checkout time is 11:00 AM the day of  discharge.   Patients discharged the day of surgery will not be allowed to drive  home.    Special Instructions: Incentive Spirometry - Practice and bring it with you on the day of surgery. Shower using CHG 2 nights before surgery and the night before surgery.  If you shower the day of surgery use CHG.  Use special wash - you have one bottle of CHG for all showers.  You should use approximately 1/3 of the bottle for each shower.   Please read over the following fact sheets that you were given: Pain Booklet, Coughing and Deep Breathing, Blood Transfusion Information, MRSA Information and Surgical Site Infection Prevention

## 2013-03-14 NOTE — Progress Notes (Signed)
Dr Ophelia Charter office made aware that patient had a severe reaction from Penicillin , stated they would  make Dr Ophelia Charter  Aware.

## 2013-03-15 ENCOUNTER — Encounter (HOSPITAL_COMMUNITY): Payer: Self-pay | Admitting: Vascular Surgery

## 2013-03-15 NOTE — Progress Notes (Signed)
Anesthesia chart review: Patient is a 47 year old female scheduled for right TKA by Dr. Ophelia Charter and 03/21/2013. History reviewed and includes diabetes mellitus type 2, IBS, HTN, GERD, HLD, non-smoker.  EKG on 614 normal sinus rhythm, cannot rule out anterior infarct, age undetermined.  QRS voltage is lower in precordial leads when compared to a prior EKG on 03/19/04 (see Muse). No CV symptoms were documented at her PAT visit.  CXR on 03/14/13 showed no active disease.  Preoperative labs noted.  H/H 10.3/33.5 (stable since 11/17/11).  T&S already done.  She will be evaluated by her assigned anesthesiologist on the day of surgery.  If no acute changes then I would anticipate that she could proceed as planned.  Velna Ochs [facility]/Anesthesiology Phone [phone] 03/15/2013 5:42 PM

## 2013-03-18 ENCOUNTER — Telehealth: Payer: Self-pay

## 2013-03-18 NOTE — Telephone Encounter (Signed)
Please call and schedule patient to see Dr Debby Bud for f/u labs, specifically A1c. Thanks

## 2013-03-21 ENCOUNTER — Ambulatory Visit (HOSPITAL_COMMUNITY)
Admission: RE | Admit: 2013-03-21 | Payer: PRIVATE HEALTH INSURANCE | Source: Ambulatory Visit | Admitting: Orthopaedic Surgery

## 2013-03-21 ENCOUNTER — Encounter (HOSPITAL_COMMUNITY): Admission: RE | Payer: Self-pay | Source: Ambulatory Visit

## 2013-03-21 SURGERY — ARTHROPLASTY, KNEE, TOTAL, USING IMAGELESS COMPUTER-ASSISTED NAVIGATION
Anesthesia: General | Site: Knee | Laterality: Right

## 2013-03-21 NOTE — Telephone Encounter (Signed)
LMOM to call for an appt. °

## 2013-03-29 ENCOUNTER — Other Ambulatory Visit: Payer: Self-pay | Admitting: Internal Medicine

## 2013-03-29 NOTE — Telephone Encounter (Signed)
Reached the patient this morning.  She states she does her own A1C and sends the results to Dr. Debby Bud.  She did not want an appt.

## 2013-04-18 ENCOUNTER — Other Ambulatory Visit: Payer: Self-pay | Admitting: Internal Medicine

## 2013-04-26 ENCOUNTER — Other Ambulatory Visit (HOSPITAL_COMMUNITY): Payer: Self-pay | Admitting: Orthopaedic Surgery

## 2013-04-26 NOTE — Pre-Procedure Instructions (Signed)
[name]  04/26/2013   Your procedure is scheduled on:  Friday, July 25th  Report to [facility] at 0530 AM.  Call this number if you have problems the morning of surgery: [phone]   Remember:   Do not eat food or drink liquids after midnight.   Take these medicines the morning of surgery with A SIP OF WATER: Singulair, zegrid, albuterol if needed.  Stop taking aspirin, over the counter supplements 5 days prior to surgery   Do not wear jewelry, make-up or nail polish.  Do not wear lotions, powders, or perfumes, deodorant.  Do not shave 48 hours prior to surgery. Men may shave face and neck.  Do not bring valuables to the hospital.  [facility] is not responsible   for any belongings or valuables.  Contacts, dentures or bridgework may not be worn into surgery.  Leave suitcase in the car. After surgery it may be brought to your room.  For patients admitted to the hospital, checkout time is 11:00 AM the day of discharge.   Special Instructions: Shower using CHG 2 nights before surgery and the night before surgery.  If you shower the day of surgery use CHG.  Use special wash - you have one bottle of CHG for all showers.  You should use approximately 1/3 of the bottle for each shower.   Please read over the following fact sheets that you were given: Pain Booklet, Coughing and Deep Breathing, Blood Transfusion Information, MRSA Information and Surgical Site Infection Prevention

## 2013-04-27 ENCOUNTER — Encounter (HOSPITAL_COMMUNITY)
Admission: RE | Admit: 2013-04-27 | Discharge: 2013-04-27 | Disposition: A | Discharge status: Home / Self Care | Payer: BC Managed Care – PPO | Source: Ambulatory Visit | Attending: Orthopaedic Surgery | Admitting: Orthopaedic Surgery

## 2013-04-27 ENCOUNTER — Encounter (HOSPITAL_COMMUNITY): Payer: Self-pay

## 2013-04-27 LAB — CBC
Platelets: 196 10*3/uL (ref 150–400)
RBC: 4.9 MIL/uL (ref 3.87–5.11)
RDW: 20.2 % — ABNORMAL HIGH (ref 11.5–15.5)
WBC: 4.5 10*3/uL (ref 4.0–10.5)

## 2013-04-27 LAB — URINALYSIS, ROUTINE W REFLEX MICROSCOPIC
Bilirubin Urine: NEGATIVE
Glucose, UA: NEGATIVE mg/dL
Hgb urine dipstick: NEGATIVE
Protein, ur: NEGATIVE mg/dL
Urobilinogen, UA: 0.2 mg/dL (ref 0.0–1.0)

## 2013-04-27 LAB — PROTIME-INR
INR: 0.98 (ref 0.00–1.49)
Prothrombin Time: 12.8 seconds (ref 11.6–15.2)

## 2013-04-27 LAB — COMPREHENSIVE METABOLIC PANEL
ALT: 28 U/L (ref 0–35)
AST: 31 U/L (ref 0–37)
Albumin: 3.9 g/dL (ref 3.5–5.2)
CO2: 29 mEq/L (ref 19–32)
Chloride: 101 mEq/L (ref 96–112)
Creatinine, Ser: 0.85 mg/dL (ref 0.50–1.10)
GFR calc non Af Amer: 81 mL/min — ABNORMAL LOW (ref >90)
Potassium: 3.8 mEq/L (ref 3.5–5.1)
Sodium: 138 mEq/L (ref 135–145)
Total Bilirubin: 0.2 mg/dL — ABNORMAL LOW (ref 0.3–1.2)

## 2013-04-27 LAB — SURGICAL PCR SCREEN
MRSA, PCR: NEGATIVE
Staphylococcus aureus: NEGATIVE

## 2013-04-27 LAB — TYPE AND SCREEN
ABO/RH(D): O POS
Antibody Screen: NEGATIVE

## 2013-04-27 NOTE — H&P (Signed)
TOTAL KNEE ADMISSION H&P  Patient is being admitted for right total knee arthroplasty.  Subjective:  Chief Complaint:right knee pain.  HPI: [name], 47 y.o. female, has a history of pain and functional disability in the right knee due to arthritis and has failed non-surgical conservative treatments for greater than 12 weeks to includeNSAID's and/or analgesics, corticosteriod injections and supervised PT with diminished ADL's post treatment.  Onset of symptoms was gradual, starting 8 years ago with gradually worsening course since that time. The patient noted prior procedures on the knee to include  arthroscopy on the right knee(s).  Patient currently rates pain in the right knee(s) at 7 out of 10 with activity. Patient has night pain, worsening of pain with activity and weight bearing, pain that interferes with activities of daily living and joint swelling.  Patient has evidence of subchondral sclerosis, periarticular osteophytes and joint space narrowing by imaging studies. This patient has had grade 3/4 chondromalcia changes of the lateral tibial plateau with exposed bone on arthroscopy. There is no active infection.  Patient Active Problem List   Diagnosis Date Noted  . Acute sinus infection 08/27/2012  . 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 Routine health maintenance 11/17/2011  . NAUSEA 09/06/2010  . LUQ PAIN 09/06/2010  . Diarrhea 05/25/2009  . LOW BACK PAIN 06/27/2008  . ALLERGIC RHINITIS 10/08/2007  . Headache 10/08/2007  . Type II or unspecified type diabetes mellitus without mention of complication, uncontrolled 10/08/2007  . HYPERLIPIDEMIA 04/22/2007  . HYPERTENSION 04/22/2007  . ASTHMA 04/22/2007  . GERD 04/22/2007   Past Medical History  Diagnosis Date  . ALLERGIC RHINITIS 10/08/2007  . ASTHMA 04/22/2007  . GERD 04/22/2007  . Headache(784.0) 10/08/2007  . HYPERTENSION 04/22/2007  . HYPERLIPIDEMIA 04/22/2007  . HYPERTENSION 04/22/2007  . LOW BACK PAIN 06/27/2008  . LUQ PAIN  09/06/2010  . NAUSEA 09/06/2010  . IBS (irritable bowel syndrome)     constipation predominant  . PONV (postoperative nausea and vomiting)   . Diabetes mellitus without complication   . Arthritis     Past Surgical History  Procedure Laterality Date  . Knee surgery  92, 96, 01, 08    right  . Knee surgery  88    left  . Nasal polyp surgery  95    sinus  . Lumbar fusion  05    L4-5 S1  . Tubal ligation    . Cesarean section      No prescriptions prior to admission   Allergies  Allergen Reactions  . Penicillins     Severe reaction - hospitlaized    History  Substance Use Topics  . Smoking status: Never Smoker   . Smokeless tobacco: Never Used  . Alcohol Use: No    Family History  Problem Relation Age of Onset  . Hypertension Mother   . Arthritis Mother     OA s/p bilateral hip replacement  . Diabetes Father   . Heart disease Father     CAD  . Hyperlipidemia Father   . Hypertension Father   . Pulmonary fibrosis Father   . Osteopenia Sister     older with GI problems s/p colectomy  . Cancer Maternal Grandmother     Ovarian Cancer  . Osteopenia Sister      Review of Systems  Musculoskeletal:       Progressive right knee pain  Psychiatric/Behavioral: Positive for substance abuse.  All other systems reviewed and are negative.    Objective:  Physical Exam  Constitutional:  She is oriented to person, place, and time. She appears well-developed and well-nourished.  HENT:  Head: Normocephalic and atraumatic.  Eyes: EOM are normal. Pupils are equal, round, and reactive to light.  Neck: Normal range of motion. Neck supple.  Cardiovascular: Normal rate, regular rhythm and intact distal pulses.   Respiratory: Effort normal.  GI: Soft.  Musculoskeletal: She exhibits edema.  Right knee with valgus deformity.  +3 effusion.  ROM 0-100.  Ambulatory with a limp  Neurological: She is alert and oriented to person, place, and time.  Skin: Skin is warm and dry.   Psychiatric: She has a normal mood and affect.    Vital signs in last 24 hours: Temp:  [98.2 F (36.8 C)] 98.2 F (36.8 C) (07/23 1019) Pulse Rate:  [85] 85 (07/23 1019) Resp:  [20] 20 (07/23 1019) BP: (130)/(83) 130/83 mmHg (07/23 1019) SpO2:  [100 %] 100 % (07/23 1019) Weight:  [71.351 kg (157 lb 4.8 oz)] 71.351 kg (157 lb 4.8 oz) (07/23 1019)  Labs:   Estimated body mass index is 27.87 kg/(m^2) as calculated from the following:   Height as of 03/14/13: 5\' 3"  (1.6 m).   Weight as of 03/14/13: 71.351 kg (157 lb 4.8 oz).   Imaging Review Plain radiographs demonstrate severe degenerative joint disease of the right knee(s). The overall alignment issignificant valgus. The bone quality appears to be adequate for age and reported activity level.  Assessment/Plan:  End stage arthritis, right knee   The patient history, physical examination, clinical judgment of the provider and imaging studies are consistent with end stage degenerative joint disease of the right knee(s) and total knee arthroplasty is deemed medically necessary. The treatment options including medical management, injection therapy arthroscopy and arthroplasty were discussed at length. The risks and benefits of total knee arthroplasty were presented and reviewed. The risks due to aseptic loosening, infection, stiffness, patella tracking problems, thromboembolic complications and other imponderables were discussed. The patient acknowledged the explanation, agreed to proceed with the plan and consent was signed. Patient is being admitted for inpatient treatment for surgery, pain control, PT, OT, prophylactic antibiotics, VTE prophylaxis, progressive ambulation and ADL's and discharge planning. The patient is planning to be discharged home with home health services

## 2013-04-28 MED ORDER — VANCOMYCIN HCL IN DEXTROSE 1-5 GM/200ML-% IV SOLN
1000.0000 mg | INTRAVENOUS | Status: AC
  Administered 2013-04-29: 1000 mg via INTRAVENOUS
  Filled 2013-04-28: qty 200

## 2013-04-28 NOTE — Progress Notes (Signed)
Left a message for pt that new arrival time is 0630.Asked that she call back to confirm this message

## 2013-04-28 NOTE — Progress Notes (Signed)
Anesthesia Update:  See my note from 03/15/13.  I believe surgery was rescheduled due to insurance reasons.  New labs noted.    Velna Ochs [facility] Short Stay Center/Anesthesiology Phone [phone] 04/28/2013 11:52 AM

## 2013-04-29 ENCOUNTER — Ambulatory Visit (HOSPITAL_COMMUNITY): Payer: BC Managed Care – PPO | Admitting: Anesthesiology

## 2013-04-29 ENCOUNTER — Encounter (HOSPITAL_COMMUNITY)
Admission: RE | Disposition: A | Discharge status: Home Health | Payer: Self-pay | Source: Ambulatory Visit | Attending: Orthopaedic Surgery

## 2013-04-29 ENCOUNTER — Encounter (HOSPITAL_COMMUNITY): Payer: Self-pay | Admitting: Vascular Surgery

## 2013-04-29 ENCOUNTER — Inpatient Hospital Stay (HOSPITAL_COMMUNITY)
Admission: RE | Admit: 2013-04-29 | Discharge: 2013-05-02 | DRG: 209 | Disposition: A | Discharge status: Home Health | Payer: BC Managed Care – PPO | Source: Ambulatory Visit | Attending: Orthopaedic Surgery | Admitting: Orthopaedic Surgery

## 2013-04-29 ENCOUNTER — Encounter (HOSPITAL_COMMUNITY): Payer: Self-pay | Admitting: *Deleted

## 2013-04-29 DIAGNOSIS — J45909 Unspecified asthma, uncomplicated: Secondary | ICD-10-CM | POA: Diagnosis present

## 2013-04-29 DIAGNOSIS — Z981 Arthrodesis status: Secondary | ICD-10-CM

## 2013-04-29 DIAGNOSIS — D62 Acute posthemorrhagic anemia: Secondary | ICD-10-CM | POA: Diagnosis not present

## 2013-04-29 DIAGNOSIS — K219 Gastro-esophageal reflux disease without esophagitis: Secondary | ICD-10-CM | POA: Diagnosis present

## 2013-04-29 DIAGNOSIS — M1710 Unilateral primary osteoarthritis, unspecified knee: Principal | ICD-10-CM | POA: Diagnosis present

## 2013-04-29 DIAGNOSIS — I10 Essential (primary) hypertension: Secondary | ICD-10-CM | POA: Diagnosis present

## 2013-04-29 DIAGNOSIS — E119 Type 2 diabetes mellitus without complications: Secondary | ICD-10-CM | POA: Diagnosis present

## 2013-04-29 DIAGNOSIS — M1711 Unilateral primary osteoarthritis, right knee: Secondary | ICD-10-CM | POA: Diagnosis present

## 2013-04-29 DIAGNOSIS — E785 Hyperlipidemia, unspecified: Secondary | ICD-10-CM | POA: Diagnosis present

## 2013-04-29 DIAGNOSIS — K589 Irritable bowel syndrome without diarrhea: Secondary | ICD-10-CM | POA: Diagnosis present

## 2013-04-29 HISTORY — PX: KNEE ARTHROPLASTY: SHX992

## 2013-04-29 LAB — GLUCOSE, CAPILLARY: Glucose-Capillary: 177 mg/dL — ABNORMAL HIGH (ref 70–99)

## 2013-04-29 SURGERY — ARTHROPLASTY, KNEE, TOTAL, USING IMAGELESS COMPUTER-ASSISTED NAVIGATION
Anesthesia: General | Site: Knee | Laterality: Right | Wound class: Clean

## 2013-04-29 MED ORDER — OXYCODONE HCL 5 MG PO TABS
ORAL_TABLET | ORAL | Status: AC
  Filled 2013-04-29: qty 1

## 2013-04-29 MED ORDER — SCOPOLAMINE 1 MG/3DAYS TD PT72
MEDICATED_PATCH | TRANSDERMAL | Status: AC
  Filled 2013-04-29: qty 1

## 2013-04-29 MED ORDER — ONDANSETRON HCL 4 MG PO TABS: 4.0000 mg | ORAL_TABLET | Freq: Four times a day (QID) | ORAL | Status: DC | PRN

## 2013-04-29 MED ORDER — LORATADINE 10 MG PO TABS
10.0000 mg | ORAL_TABLET | Freq: Every day | ORAL | Status: DC
  Administered 2013-04-29 – 2013-05-02 (×4): 10 mg via ORAL
  Filled 2013-04-29 (×4): qty 1

## 2013-04-29 MED ORDER — ZOLPIDEM TARTRATE 5 MG PO TABS: 5.0000 mg | ORAL_TABLET | Freq: Every evening | ORAL | Status: DC | PRN

## 2013-04-29 MED ORDER — MIDAZOLAM HCL 2 MG/2ML IJ SOLN
INTRAMUSCULAR | Status: AC
  Filled 2013-04-29: qty 2

## 2013-04-29 MED ORDER — INSULIN ASPART 100 UNIT/ML ~~LOC~~ SOLN
0.0000 [IU] | Freq: Three times a day (TID) | SUBCUTANEOUS | Status: DC
  Administered 2013-04-30: 3 [IU] via SUBCUTANEOUS
  Administered 2013-04-30: 2 [IU] via SUBCUTANEOUS
  Administered 2013-04-30: 3 [IU] via SUBCUTANEOUS
  Administered 2013-05-01: 2 [IU] via SUBCUTANEOUS
  Administered 2013-05-01: 3 [IU] via SUBCUTANEOUS

## 2013-04-29 MED ORDER — OXYCODONE HCL 5 MG/5ML PO SOLN: 5.0000 mg | Freq: Once | ORAL | Status: AC | PRN

## 2013-04-29 MED ORDER — MUPIROCIN 2 % EX OINT
TOPICAL_OINTMENT | Freq: Two times a day (BID) | CUTANEOUS | Status: DC
  Administered 2013-04-29: 1 via NASAL
  Administered 2013-04-29 – 2013-04-30 (×2): via NASAL
  Filled 2013-04-29: qty 22

## 2013-04-29 MED ORDER — METHOCARBAMOL 500 MG PO TABS
500.0000 mg | ORAL_TABLET | Freq: Four times a day (QID) | ORAL | Status: DC | PRN
  Administered 2013-04-29 – 2013-05-02 (×11): 500 mg via ORAL
  Filled 2013-04-29 (×11): qty 1

## 2013-04-29 MED ORDER — HYDROCHLOROTHIAZIDE 12.5 MG PO CAPS
12.5000 mg | ORAL_CAPSULE | Freq: Every day | ORAL | Status: DC
  Administered 2013-04-30 – 2013-05-02 (×3): 12.5 mg via ORAL
  Filled 2013-04-29 (×3): qty 1

## 2013-04-29 MED ORDER — NEOSTIGMINE METHYLSULFATE 1 MG/ML IJ SOLN
INTRAMUSCULAR | Status: DC | PRN
  Administered 2013-04-29: 3 mg via INTRAVENOUS

## 2013-04-29 MED ORDER — LACTATED RINGERS IV SOLN
INTRAVENOUS | Status: DC | PRN
  Administered 2013-04-29 (×2): via INTRAVENOUS

## 2013-04-29 MED ORDER — ROPIVACAINE HCL 5 MG/ML IJ SOLN
INTRAMUSCULAR | Status: DC | PRN
  Administered 2013-04-29: 25 mL

## 2013-04-29 MED ORDER — ONDANSETRON HCL 4 MG/2ML IJ SOLN
INTRAMUSCULAR | Status: DC | PRN
  Administered 2013-04-29: 4 mg via INTRAVENOUS

## 2013-04-29 MED ORDER — ONDANSETRON HCL 4 MG/2ML IJ SOLN
INTRAMUSCULAR | Status: AC
  Filled 2013-04-29: qty 2

## 2013-04-29 MED ORDER — OXYCODONE HCL 5 MG PO TABS
5.0000 mg | ORAL_TABLET | Freq: Once | ORAL | Status: AC | PRN
  Administered 2013-04-29: 5 mg via ORAL

## 2013-04-29 MED ORDER — VANCOMYCIN HCL IN DEXTROSE 1-5 GM/200ML-% IV SOLN
1000.0000 mg | Freq: Two times a day (BID) | INTRAVENOUS | Status: AC
  Administered 2013-04-29: 1000 mg via INTRAVENOUS
  Filled 2013-04-29: qty 200

## 2013-04-29 MED ORDER — HYDROMORPHONE HCL PF 1 MG/ML IJ SOLN
INTRAMUSCULAR | Status: DC | PRN
  Administered 2013-04-29: 1 mg via INTRAVENOUS

## 2013-04-29 MED ORDER — BUTALBITAL-APAP-CAFFEINE 50-325-40 MG PO TABS: 1.0000 | ORAL_TABLET | Freq: Two times a day (BID) | ORAL | Status: DC | PRN

## 2013-04-29 MED ORDER — FENTANYL CITRATE 0.05 MG/ML IJ SOLN
INTRAMUSCULAR | Status: AC
  Administered 2013-04-29: 100 ug via INTRAVENOUS
  Filled 2013-04-29: qty 2

## 2013-04-29 MED ORDER — ALBUTEROL SULFATE HFA 108 (90 BASE) MCG/ACT IN AERS
2.0000 | INHALATION_SPRAY | Freq: Four times a day (QID) | RESPIRATORY_TRACT | Status: DC | PRN
  Filled 2013-04-29: qty 6.7

## 2013-04-29 MED ORDER — ACETAMINOPHEN 325 MG PO TABS
650.0000 mg | ORAL_TABLET | Freq: Four times a day (QID) | ORAL | Status: DC | PRN
  Administered 2013-04-30: 650 mg via ORAL
  Filled 2013-04-29: qty 2

## 2013-04-29 MED ORDER — PANTOPRAZOLE SODIUM 40 MG PO TBEC
80.0000 mg | DELAYED_RELEASE_TABLET | Freq: Every day | ORAL | Status: DC
  Administered 2013-04-29 – 2013-05-02 (×4): 80 mg via ORAL
  Filled 2013-04-29 (×4): qty 2

## 2013-04-29 MED ORDER — FENTANYL CITRATE 0.05 MG/ML IJ SOLN
INTRAMUSCULAR | Status: DC | PRN
  Administered 2013-04-29: 50 ug via INTRAVENOUS
  Administered 2013-04-29: 100 ug via INTRAVENOUS
  Administered 2013-04-29 (×2): 50 ug via INTRAVENOUS

## 2013-04-29 MED ORDER — METHOCARBAMOL 500 MG PO TABS
ORAL_TABLET | ORAL | Status: AC
  Filled 2013-04-29: qty 1

## 2013-04-29 MED ORDER — MENTHOL 3 MG MT LOZG: 1.0000 | LOZENGE | OROMUCOSAL | Status: DC | PRN

## 2013-04-29 MED ORDER — FENTANYL CITRATE 0.05 MG/ML IJ SOLN
INTRAMUSCULAR | Status: AC
  Filled 2013-04-29: qty 2

## 2013-04-29 MED ORDER — METOCLOPRAMIDE HCL 10 MG PO TABS: 5.0000 mg | ORAL_TABLET | Freq: Three times a day (TID) | ORAL | Status: DC | PRN

## 2013-04-29 MED ORDER — METHOCARBAMOL 100 MG/ML IJ SOLN
500.0000 mg | Freq: Four times a day (QID) | INTRAVENOUS | Status: DC | PRN
  Filled 2013-04-29: qty 5

## 2013-04-29 MED ORDER — FLEET ENEMA 7-19 GM/118ML RE ENEM: 1.0000 | ENEMA | Freq: Once | RECTAL | Status: AC | PRN

## 2013-04-29 MED ORDER — OXYCODONE HCL 5 MG PO TABS
5.0000 mg | ORAL_TABLET | ORAL | Status: DC | PRN
  Administered 2013-04-29: 10 mg via ORAL
  Administered 2013-04-30: 5 mg via ORAL
  Administered 2013-04-30 (×3): 10 mg via ORAL
  Administered 2013-04-30 (×3): 5 mg via ORAL
  Administered 2013-05-01 – 2013-05-02 (×10): 10 mg via ORAL
  Filled 2013-04-29: qty 1
  Filled 2013-04-29 (×5): qty 2
  Filled 2013-04-29 (×2): qty 1
  Filled 2013-04-29 (×8): qty 2
  Filled 2013-04-29: qty 1
  Filled 2013-04-29: qty 2

## 2013-04-29 MED ORDER — ADULT MULTIVITAMIN W/MINERALS CH
1.0000 | ORAL_TABLET | Freq: Every day | ORAL | Status: DC
  Administered 2013-04-29 – 2013-05-02 (×4): 1 via ORAL
  Filled 2013-04-29 (×4): qty 1

## 2013-04-29 MED ORDER — METOCLOPRAMIDE HCL 5 MG/ML IJ SOLN: 10.0000 mg | Freq: Once | INTRAMUSCULAR | Status: DC | PRN

## 2013-04-29 MED ORDER — DIPHENHYDRAMINE HCL 12.5 MG/5ML PO ELIX: 12.5000 mg | ORAL_SOLUTION | ORAL | Status: DC | PRN

## 2013-04-29 MED ORDER — SODIUM CHLORIDE 0.9 % IR SOLN
Status: DC | PRN
  Administered 2013-04-29: 1000 mL

## 2013-04-29 MED ORDER — LIDOCAINE HCL 1 % IJ SOLN
INTRAMUSCULAR | Status: DC | PRN
  Administered 2013-04-29: 2 mL via INTRADERMAL

## 2013-04-29 MED ORDER — MINERAL OIL PO OIL
30.0000 mL | TOPICAL_OIL | Freq: Every day | ORAL | Status: DC | PRN
  Filled 2013-04-29: qty 30

## 2013-04-29 MED ORDER — BUPIVACAINE HCL (PF) 0.5 % IJ SOLN
INTRAMUSCULAR | Status: DC | PRN
  Administered 2013-04-29: 30 mL

## 2013-04-29 MED ORDER — ACETAMINOPHEN 650 MG RE SUPP: 650.0000 mg | Freq: Four times a day (QID) | RECTAL | Status: DC | PRN

## 2013-04-29 MED ORDER — LACTATED RINGERS IV SOLN
INTRAVENOUS | Status: DC
  Administered 2013-04-29: 07:00:00 via INTRAVENOUS

## 2013-04-29 MED ORDER — GLIMEPIRIDE 2 MG PO TABS
2.0000 mg | ORAL_TABLET | Freq: Every day | ORAL | Status: DC
  Administered 2013-04-30 – 2013-05-02 (×3): 2 mg via ORAL
  Filled 2013-04-29 (×4): qty 1

## 2013-04-29 MED ORDER — PHENYLEPHRINE HCL 10 MG/ML IJ SOLN
INTRAMUSCULAR | Status: DC | PRN
  Administered 2013-04-29: 120 ug via INTRAVENOUS

## 2013-04-29 MED ORDER — PHENOL 1.4 % MT LIQD: 1.0000 | OROMUCOSAL | Status: DC | PRN

## 2013-04-29 MED ORDER — BUPIVACAINE HCL (PF) 0.5 % IJ SOLN
INTRAMUSCULAR | Status: AC
  Filled 2013-04-29: qty 30

## 2013-04-29 MED ORDER — MIDAZOLAM HCL 5 MG/ML IJ SOLN
2.0000 mg | Freq: Once | INTRAMUSCULAR | Status: AC
  Administered 2013-04-29: 2 mg via INTRAVENOUS

## 2013-04-29 MED ORDER — ROCURONIUM BROMIDE 100 MG/10ML IV SOLN
INTRAVENOUS | Status: DC | PRN
  Administered 2013-04-29: 50 mg via INTRAVENOUS

## 2013-04-29 MED ORDER — SIMVASTATIN 20 MG PO TABS
20.0000 mg | ORAL_TABLET | Freq: Every day | ORAL | Status: DC
  Administered 2013-04-29 – 2013-05-02 (×4): 20 mg via ORAL
  Filled 2013-04-29 (×4): qty 1

## 2013-04-29 MED ORDER — LISINOPRIL-HYDROCHLOROTHIAZIDE 10-12.5 MG PO TABS: 1.0000 | ORAL_TABLET | Freq: Every day | ORAL | Status: DC

## 2013-04-29 MED ORDER — ONDANSETRON HCL 4 MG/2ML IJ SOLN
4.0000 mg | Freq: Four times a day (QID) | INTRAMUSCULAR | Status: DC | PRN
  Administered 2013-04-30: 4 mg via INTRAVENOUS
  Filled 2013-04-29: qty 2

## 2013-04-29 MED ORDER — HYDROMORPHONE HCL PF 1 MG/ML IJ SOLN
INTRAMUSCULAR | Status: AC
  Administered 2013-04-29: 0.5 mg via INTRAVENOUS
  Filled 2013-04-29: qty 1

## 2013-04-29 MED ORDER — LISINOPRIL 10 MG PO TABS
10.0000 mg | ORAL_TABLET | Freq: Every day | ORAL | Status: DC
  Administered 2013-04-30 – 2013-05-02 (×3): 10 mg via ORAL
  Filled 2013-04-29 (×3): qty 1

## 2013-04-29 MED ORDER — DOCUSATE SODIUM 100 MG PO CAPS
100.0000 mg | ORAL_CAPSULE | Freq: Two times a day (BID) | ORAL | Status: DC
  Administered 2013-04-29 – 2013-05-02 (×6): 100 mg via ORAL
  Filled 2013-04-29 (×8): qty 1

## 2013-04-29 MED ORDER — METOCLOPRAMIDE HCL 5 MG/ML IJ SOLN: 5.0000 mg | Freq: Three times a day (TID) | INTRAMUSCULAR | Status: DC | PRN

## 2013-04-29 MED ORDER — POTASSIUM CHLORIDE IN NACL 20-0.45 MEQ/L-% IV SOLN
INTRAVENOUS | Status: DC
  Administered 2013-04-29 – 2013-04-30 (×2): via INTRAVENOUS
  Filled 2013-04-29 (×7): qty 1000

## 2013-04-29 MED ORDER — BISACODYL 10 MG RE SUPP
10.0000 mg | Freq: Every day | RECTAL | Status: DC | PRN
  Administered 2013-05-01 – 2013-05-02 (×2): 10 mg via RECTAL
  Filled 2013-04-29 (×2): qty 1

## 2013-04-29 MED ORDER — PROPOFOL 10 MG/ML IV BOLUS
INTRAVENOUS | Status: DC | PRN
  Administered 2013-04-29: 150 mg via INTRAVENOUS

## 2013-04-29 MED ORDER — DEXAMETHASONE SODIUM PHOSPHATE 10 MG/ML IJ SOLN
INTRAMUSCULAR | Status: DC | PRN
  Administered 2013-04-29: 4 mg via INTRAVENOUS

## 2013-04-29 MED ORDER — FENTANYL CITRATE 0.05 MG/ML IJ SOLN: 100.0000 ug | Freq: Once | INTRAMUSCULAR | Status: DC

## 2013-04-29 MED ORDER — SENNOSIDES-DOCUSATE SODIUM 8.6-50 MG PO TABS: 1.0000 | ORAL_TABLET | Freq: Every evening | ORAL | Status: DC | PRN

## 2013-04-29 MED ORDER — MORPHINE SULFATE 2 MG/ML IJ SOLN
2.0000 mg | INTRAMUSCULAR | Status: DC | PRN
  Administered 2013-04-29 – 2013-04-30 (×7): 2 mg via INTRAVENOUS
  Filled 2013-04-29 (×7): qty 1

## 2013-04-29 MED ORDER — SCOPOLAMINE 1 MG/3DAYS TD PT72
MEDICATED_PATCH | TRANSDERMAL | Status: DC | PRN
  Administered 2013-04-29: 1 via TRANSDERMAL

## 2013-04-29 MED ORDER — LIDOCAINE HCL (CARDIAC) 20 MG/ML IV SOLN
INTRAVENOUS | Status: DC | PRN
  Administered 2013-04-29: 50 mg via INTRAVENOUS

## 2013-04-29 MED ORDER — OXYCODONE-ACETAMINOPHEN 5-325 MG PO TABS: 1.0000 | ORAL_TABLET | ORAL | Status: DC | PRN

## 2013-04-29 MED ORDER — GLYCOPYRROLATE 0.2 MG/ML IJ SOLN
INTRAMUSCULAR | Status: DC | PRN
  Administered 2013-04-29: 0.6 mg via INTRAVENOUS

## 2013-04-29 MED ORDER — HYDROMORPHONE HCL PF 1 MG/ML IJ SOLN
0.2500 mg | INTRAMUSCULAR | Status: DC | PRN
  Administered 2013-04-29: 0.5 mg via INTRAVENOUS

## 2013-04-29 MED ORDER — ASPIRIN EC 325 MG PO TBEC: 325.0000 mg | DELAYED_RELEASE_TABLET | Freq: Every day | ORAL | Status: DC

## 2013-04-29 MED ORDER — MONTELUKAST SODIUM 10 MG PO TABS
10.0000 mg | ORAL_TABLET | Freq: Every day | ORAL | Status: DC
  Administered 2013-04-29 – 2013-05-02 (×4): 10 mg via ORAL
  Filled 2013-04-29 (×4): qty 1

## 2013-04-29 MED ORDER — ASPIRIN EC 325 MG PO TBEC
325.0000 mg | DELAYED_RELEASE_TABLET | Freq: Every day | ORAL | Status: DC
  Administered 2013-04-30 – 2013-05-02 (×3): 325 mg via ORAL
  Filled 2013-04-29 (×4): qty 1

## 2013-04-29 MED ORDER — METOCLOPRAMIDE HCL 5 MG/ML IJ SOLN
INTRAMUSCULAR | Status: DC | PRN
  Administered 2013-04-29: 5 mg via INTRAVENOUS

## 2013-04-29 SURGICAL SUPPLY — 69 items
APL SKNCLS STERI-STRIP NONHPOA (GAUZE/BANDAGES/DRESSINGS) ×1
BANDAGE ELASTIC 4 VELCRO ST LF (GAUZE/BANDAGES/DRESSINGS) ×2 IMPLANT
BANDAGE ESMARK 6X9 LF (GAUZE/BANDAGES/DRESSINGS) ×1 IMPLANT
BENZOIN TINCTURE PRP APPL 2/3 (GAUZE/BANDAGES/DRESSINGS) ×2 IMPLANT
BLADE SAGITTAL 25.0X1.19X90 (BLADE) ×2 IMPLANT
BLADE SAW SGTL 13X75X1.27 (BLADE) ×2 IMPLANT
BNDG CMPR 9X6 STRL LF SNTH (GAUZE/BANDAGES/DRESSINGS) ×1
BNDG CMPR MED 10X6 ELC LF (GAUZE/BANDAGES/DRESSINGS) ×1
BNDG ELASTIC 6X10 VLCR STRL LF (GAUZE/BANDAGES/DRESSINGS) ×2 IMPLANT
BNDG ESMARK 6X9 LF (GAUZE/BANDAGES/DRESSINGS) ×2
BOWL SMART MIX CTS (DISPOSABLE) ×2 IMPLANT
CAPT RP KNEE ×1 IMPLANT
CEMENT HV SMART SET (Cement) ×4 IMPLANT
CLOTH BEACON ORANGE TIMEOUT ST (SAFETY) ×2 IMPLANT
CLSR STERI-STRIP ANTIMIC 1/2X4 (GAUZE/BANDAGES/DRESSINGS) ×2 IMPLANT
COVER BACK TABLE 24X17X13 BIG (DRAPES) IMPLANT
COVER SURGICAL LIGHT HANDLE (MISCELLANEOUS) ×2 IMPLANT
CUFF TOURNIQUET SINGLE 34IN LL (TOURNIQUET CUFF) ×2 IMPLANT
CUFF TOURNIQUET SINGLE 44IN (TOURNIQUET CUFF) IMPLANT
DRAPE ORTHO SPLIT 77X108 STRL (DRAPES) ×4
DRAPE SURG ORHT 6 SPLT 77X108 (DRAPES) ×2 IMPLANT
DRAPE U-SHAPE 47X51 STRL (DRAPES) ×2 IMPLANT
DRSG PAD ABDOMINAL 8X10 ST (GAUZE/BANDAGES/DRESSINGS) ×4 IMPLANT
DURAPREP 26ML APPLICATOR (WOUND CARE) ×2 IMPLANT
ELECT REM PT RETURN 9FT ADLT (ELECTROSURGICAL) ×2
ELECTRODE REM PT RTRN 9FT ADLT (ELECTROSURGICAL) ×1 IMPLANT
FACESHIELD LNG OPTICON STERILE (SAFETY) ×2 IMPLANT
GAUZE XEROFORM 5X9 LF (GAUZE/BANDAGES/DRESSINGS) ×2 IMPLANT
GLOVE BIOGEL PI IND STRL 7.5 (GLOVE) ×1 IMPLANT
GLOVE BIOGEL PI IND STRL 8 (GLOVE) ×1 IMPLANT
GLOVE BIOGEL PI INDICATOR 7.5 (GLOVE) ×1
GLOVE BIOGEL PI INDICATOR 8 (GLOVE) ×1
GLOVE ECLIPSE 7.0 STRL STRAW (GLOVE) ×2 IMPLANT
GLOVE ORTHO TXT STRL SZ7.5 (GLOVE) ×2 IMPLANT
GOWN PREVENTION PLUS LG XLONG (DISPOSABLE) IMPLANT
GOWN PREVENTION PLUS XLARGE (GOWN DISPOSABLE) ×2 IMPLANT
GOWN STRL NON-REIN LRG LVL3 (GOWN DISPOSABLE) ×6 IMPLANT
HANDPIECE INTERPULSE COAX TIP (DISPOSABLE) ×2
IMMOBILIZER KNEE 22 UNIV (SOFTGOODS) ×2 IMPLANT
KIT BASIN OR (CUSTOM PROCEDURE TRAY) ×2 IMPLANT
KIT ROOM TURNOVER OR (KITS) ×2 IMPLANT
MANIFOLD NEPTUNE II (INSTRUMENTS) ×2 IMPLANT
MARKER SPHERE PSV REFLC THRD 5 (MARKER) ×6 IMPLANT
NDL 1/2 CIR MAYO (NEEDLE) ×1 IMPLANT
NDL HYPO 25GX1X1/2 BEV (NEEDLE) ×1 IMPLANT
NEEDLE 1/2 CIR MAYO (NEEDLE) ×2 IMPLANT
NEEDLE HYPO 25GX1X1/2 BEV (NEEDLE) ×2 IMPLANT
NS IRRIG 1000ML POUR BTL (IV SOLUTION) ×2 IMPLANT
PACK TOTAL JOINT (CUSTOM PROCEDURE TRAY) ×2 IMPLANT
PAD ARMBOARD 7.5X6 YLW CONV (MISCELLANEOUS) ×4 IMPLANT
PAD CAST 4YDX4 CTTN HI CHSV (CAST SUPPLIES) ×1 IMPLANT
PADDING CAST COTTON 4X4 STRL (CAST SUPPLIES) ×2
PADDING CAST COTTON 6X4 STRL (CAST SUPPLIES) ×2 IMPLANT
PIN SCHANZ 4MM 130MM (PIN) ×8 IMPLANT
SET HNDPC FAN SPRY TIP SCT (DISPOSABLE) ×1 IMPLANT
SPONGE GAUZE 4X4 12PLY (GAUZE/BANDAGES/DRESSINGS) ×2 IMPLANT
STAPLER VISISTAT 35W (STAPLE) ×2 IMPLANT
STRIP CLOSURE SKIN 1/2X4 (GAUZE/BANDAGES/DRESSINGS) ×4 IMPLANT
SUCTION FRAZIER TIP 10 FR DISP (SUCTIONS) ×2 IMPLANT
SUT ETHIBOND NAB CT1 #1 30IN (SUTURE) ×2 IMPLANT
SUT VIC AB 2-0 CT1 27 (SUTURE) ×4
SUT VIC AB 2-0 CT1 TAPERPNT 27 (SUTURE) ×2 IMPLANT
SUT VICRYL 4-0 PS2 18IN ABS (SUTURE) ×2 IMPLANT
SUT VICRYL AB 2 0 TIES (SUTURE) ×2 IMPLANT
SYR CONTROL 10ML LL (SYRINGE) ×2 IMPLANT
TOWEL OR 17X24 6PK STRL BLUE (TOWEL DISPOSABLE) ×2 IMPLANT
TOWEL OR 17X26 10 PK STRL BLUE (TOWEL DISPOSABLE) ×2 IMPLANT
TRAY FOLEY CATH 16FRSI W/METER (SET/KITS/TRAYS/PACK) ×2 IMPLANT
WATER STERILE IRR 1000ML POUR (IV SOLUTION) ×6 IMPLANT

## 2013-04-29 NOTE — Transfer of Care (Signed)
Immediate Anesthesia Transfer of Care Note  Patient: [name]  Procedure(s) Performed: Procedure(s) with comments: COMPUTER ASSISTED TOTAL KNEE ARTHROPLASTY (Right) - Right Total Knee Arthroplasty, Cemented  Patient Location: PACU  Anesthesia Type:General  Level of Consciousness: awake and alert   Airway & Oxygen Therapy: Patient Spontanous Breathing and Patient connected to nasal cannula oxygen  Post-op Assessment: Report given to PACU RN, Post -op Vital signs reviewed and stable and Patient moving all extremities X 4  Post vital signs: Reviewed  Complications: No apparent anesthesia complications

## 2013-04-29 NOTE — Plan of Care (Signed)
Problem: Consults Goal: Diagnosis- Total Joint Replacement Primary Total Knee Right     

## 2013-04-29 NOTE — Brief Op Note (Cosign Needed)
04/29/2013  10:41 AM  PATIENT:  [name]  47 y.o. female  PRE-OPERATIVE DIAGNOSIS:  Right Knee Osteoarthritis  POST-OPERATIVE DIAGNOSIS:  Right Knee Osteoarthritis  PROCEDURE:  Procedure(s) with comments: COMPUTER ASSISTED TOTAL KNEE ARTHROPLASTY (Right) - Right Total Knee Arthroplasty, Cemented  SURGEON:  Surgeon(s) and Role:    * Eldred Manges, MD - Primary  PHYSICIAN ASSISTANT: Maud Deed [facility]  ASSISTANTS: none   ANESTHESIA:   general  EBL:  Total I/O In: 1600 [I.V.:1600] Out: 250 [Urine:100; Blood:150]  BLOOD ADMINISTERED:none  DRAINS: none   LOCAL MEDICATIONS USED:  MARCAINE     SPECIMEN:  No Specimen  DISPOSITION OF SPECIMEN:  N/A  COUNTS:  YES  TOURNIQUET:   Total Tourniquet Time Documented: Thigh (Right) - 57 minutes Total: Thigh (Right) - 57 minutes   DICTATION: .Note written in EPIC  PLAN OF CARE: Admit to inpatient   PATIENT DISPOSITION:  PACU - hemodynamically stable.   Delay start of Pharmacological VTE agent (>24hrs) due to surgical blood loss or risk of bleeding: no

## 2013-04-29 NOTE — Interval H&P Note (Signed)
History and Physical Interval Note:  04/29/2013 8:17 AM  [name]  has presented today for surgery, with the diagnosis of Right Knee Osteoarthritis  The various methods of treatment have been discussed with the patient and family. After consideration of risks, benefits and other options for treatment, the patient has consented to  Procedure(s) with comments: COMPUTER ASSISTED TOTAL KNEE ARTHROPLASTY (Right) - Right Total Knee Arthroplasty, Cemented as a surgical intervention .  The patient's history has been reviewed, patient examined, no change in status, stable for surgery.  I have reviewed the patient's chart and labs.  Questions were answered to the patient's satisfaction.     YATES,MARK C

## 2013-04-29 NOTE — Anesthesia Preprocedure Evaluation (Addendum)
Anesthesia Evaluation  Patient identified by MRN, date of birth, ID band Patient awake    Reviewed: Allergy & Precautions, H&P , NPO status , Patient's Chart, lab work & pertinent test results, reviewed documented beta blocker date and time   History of Anesthesia Complications (+) PONV  Airway Mallampati: II TM Distance: >3 FB Neck ROM: full  Mouth opening: Limited Mouth Opening  Dental  (+) Dental Advidsory Given   Pulmonary asthma ,  breath sounds clear to auscultation        Cardiovascular hypertension, On Medications Rhythm:regular     Neuro/Psych  Headaches, negative psych ROS   GI/Hepatic Neg liver ROS, GERD-  Medicated and Controlled,  Endo/Other  diabetes, Well Controlled, Type 2, Oral Hypoglycemic Agents  Renal/GU negative Renal ROS  negative genitourinary   Musculoskeletal   Abdominal   Peds  Hematology negative hematology ROS (+)   Anesthesia Other Findings See surgeon's H&P   Reproductive/Obstetrics negative OB ROS                          Anesthesia Physical Anesthesia Plan  ASA: II  Anesthesia Plan: General   Post-op Pain Management:    Induction: Intravenous  Airway Management Planned: Oral ETT  Additional Equipment:   Intra-op Plan:   Post-operative Plan: Extubation in OR  Informed Consent: I have reviewed the patients History and Physical, chart, labs and discussed the procedure including the risks, benefits and alternatives for the proposed anesthesia with the patient or authorized representative who has indicated his/her understanding and acceptance.   Dental Advisory Given  Plan Discussed with: Anesthesiologist, CRNA and Surgeon  Anesthesia Plan Comments:        Anesthesia Quick Evaluation

## 2013-04-29 NOTE — Progress Notes (Signed)
Orthopedic Tech Progress Note Patient Details:  [name] [birth year] [id number] CPM applied to Right LE with appropriate settings. OHF applied to bed.  CPM Right Knee CPM Right Knee: On Right Knee Flexion (Degrees): 60 Right Knee Extension (Degrees): 0   [name] 04/29/2013, 12:44 PM

## 2013-04-29 NOTE — Preoperative (Signed)
Beta Blockers   Reason not to administer Beta Blockers:Not Applicable 

## 2013-04-29 NOTE — Evaluation (Signed)
Physical Therapy Evaluation Patient Details Name: [name] MRN: [id number] DOB: [birth year] Today's Date: 04/29/2013 Time: 7829-5621 PT Time Calculation (min): 23 min  PT Assessment / Plan / Recommendation History of Present Illness  s/p elective R TKA   Clinical Impression  Pt is s/p R TKA POD#0 resulting in the deficits listed below (see PT Problem List). Pt c/o pain and nausea today which limited participation in evaluation. Pt able to perform SPT from bed to chair. Pt will benefit from skilled PT to increase their independence and safety with mobility to allow discharge to the venue listed below.      PT Assessment  Patient needs continued PT services    Follow Up Recommendations  Home health PT;Supervision/Assistance - 24 hour    Does the patient have the potential to tolerate intense rehabilitation      Barriers to Discharge   none    Equipment Recommendations  None recommended by PT    Recommendations for Other Services OT consult   Frequency 7X/week    Precautions / Restrictions Precautions Precautions: Knee;Fall Precaution Booklet Issued: Yes (comment) Precaution Comments: no pillow under knee Required Braces or Orthoses: Knee Immobilizer - Right Knee Immobilizer - Right: Discontinue once straight leg raise with < 10 degree lag Restrictions Weight Bearing Restrictions: Yes RLE Weight Bearing: Weight bearing as tolerated   Pertinent Vitals/Pain "10/10"RN notified       Mobility  Bed Mobility Bed Mobility: Supine to Sit Supine to Sit: 4: Min assist;HOB flat;With rails Details for Bed Mobility Assistance: (A) to advance R LE to EOB; pt requires increased time and min cues for hand placement and sequencing  Transfers Transfers: Sit to Stand;Stand to Sit;Stand Pivot Transfers Sit to Stand: 4: Min assist;From bed;With upper extremity assist Stand to Sit: 4: Min assist;With armrests;To chair/3-in-1;With upper extremity assist Stand Pivot Transfers: 3: Mod  assist;From elevated surface;With armrests Details for Transfer Assistance: pt required min (A) to achieve upright standing position and maintain balance; vc's for hand placement and safety with RW  Ambulation/Gait Ambulation/Gait Assistance: Not tested (comment) Stairs: No Wheelchair Mobility Wheelchair Mobility: No    Exercises Total Joint Exercises Ankle Circles/Pumps: AROM;10 reps;Seated;Both   PT Diagnosis: Difficulty walking;Acute pain  PT Problem List: Decreased strength;Decreased range of motion;Decreased balance;Decreased activity tolerance;Decreased mobility;Decreased knowledge of use of DME;Pain PT Treatment Interventions: DME instruction;Gait training;Functional mobility training;Therapeutic activities;Therapeutic exercise;Balance training;Neuromuscular re-education;Patient/family education     PT Goals(Current goals can be found in the care plan section) Acute Rehab PT Goals Patient Stated Goal: to be walking better by september so i can go to [place]  PT Goal Formulation: With patient Time For Goal Achievement: 05/06/13 Potential to Achieve Goals: Good  Visit Information  Last PT Received On: 04/29/13 Assistance Needed: +1 History of Present Illness: s/p elective R TKA        Prior Functioning  Home Living Family/patient expects to be discharged to:: Private residence Living Arrangements: Spouse/significant other;Children Available Help at Discharge: Family;Available 24 hours/day Type of Home: House Home Access: Level entry Home Layout: One level Home Equipment: Grab bars - tub/shower;Grab bars - toilet;Hand held shower head;Bedside commode;Walker - 2 wheels;Walker - 4 wheels (has a Technical sales engineer she can use; has handicap toilet seat) Additional Comments: pt has handicap son that she takes care of; house is handicap acessible  Prior Function Level of Independence: Independent Comments: 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 Communication Communication: No difficulties Dominant Hand: Left    Cognition  Cognition Arousal/Alertness: Awake/alert Behavior During Therapy: WFL for tasks assessed/performed Overall Cognitive Status: Within Functional Limits for tasks assessed    Extremity/Trunk Assessment Upper Extremity Assessment Upper Extremity Assessment: Defer to OT evaluation Lower Extremity Assessment Lower Extremity Assessment: RLE deficits/detail RLE Deficits / Details: R ankle WFL;  knee grossly 2/5; limited by pain  RLE: Unable to fully assess due to pain RLE Sensation:  (WFL to light touch ) Cervical / Trunk Assessment Cervical / Trunk Assessment: Normal   Balance Balance Balance Assessed: Yes Static Sitting Balance Static Sitting - Balance Support: Feet supported;Bilateral upper extremity supported Static Sitting - Level of Assistance: 5: Stand by assistance  End of Session PT - End of Session Equipment Utilized During Treatment: Gait belt;Right knee immobilizer Activity Tolerance: Patient limited by fatigue;Other (comment);Patient limited by pain (pt c/o nausea during activity ) Patient left: in chair;with call bell/phone within reach;with family/visitor present Nurse Communication: Mobility status;Patient requests pain meds CPM Right Knee CPM Right Knee: Off Right Knee Flexion (Degrees): 60 Right Knee Extension (Degrees): 0  GP     Shelva Majestic [place], [place] [phone] 04/29/2013, 3:02 PM

## 2013-04-29 NOTE — Anesthesia Postprocedure Evaluation (Signed)
Anesthesia Post Note  Patient: [name]  Procedure(s) Performed: Procedure(s) (LRB): COMPUTER ASSISTED TOTAL KNEE ARTHROPLASTY (Right)  Anesthesia type: General  Patient location: PACU  Post pain: Pain level controlled  Post assessment: Patient's Cardiovascular Status Stable  Last Vitals:  Filed Vitals:   04/29/13 1200  BP: 116/66  Pulse: 75  Temp:   Resp: 13    Post vital signs: Reviewed and stable  Level of consciousness: alert  Complications: No apparent anesthesia complications

## 2013-04-29 NOTE — Anesthesia Procedure Notes (Addendum)
Anesthesia Regional Block:  Femoral nerve block  Pre-Anesthetic Checklist: ,, timeout performed, Correct Patient, Correct Site, Correct Laterality, Correct Procedure, Correct Position, site marked, Risks and benefits discussed,  Surgical consent,  Pre-op evaluation,  At surgeon's request and post-op pain management  Laterality: Right  Prep: chloraprep       Needles:   Needle Type: Other     Needle Length: 9cm  Needle Gauge: 21    Additional Needles:  Procedures: ultrasound guided (picture in chart) Femoral nerve block Narrative:  Start time: 04/29/2013 8:00 AM End time: 04/29/2013 8:06 AM Injection made incrementally with aspirations every 5 mL.  Performed by: Personally  Anesthesiologist: C. Frederick MD  Additional Notes: Ultrasound guidance used to: id relevant anatomy, confirm needle position, local anesthetic spread, avoidance of vascular puncture. Picture saved. No complications. Block performed personally by Janetta Hora. Gelene Mink, MD    Femoral nerve block Procedure Name: Intubation Date/Time: 04/29/2013 8:32 AM Performed by: Carmela Rima Pre-anesthesia Checklist: Patient identified, Timeout performed, Emergency Drugs available, Suction available and Patient being monitored Patient Re-evaluated:Patient Re-evaluated prior to inductionOxygen Delivery Method: Circle system utilized Preoxygenation: Pre-oxygenation with 100% oxygen Intubation Type: IV induction Ventilation: Mask ventilation without difficulty Laryngoscope Size: Mac and 4 Grade View: Grade I Tube size: 7.0 mm Number of attempts: 1 Placement Confirmation: ETT inserted through vocal cords under direct vision,  positive ETCO2 and breath sounds checked- equal and bilateral Secured at: 21 cm Tube secured with: Tape Dental Injury: Teeth and Oropharynx as per pre-operative assessment

## 2013-04-29 NOTE — Progress Notes (Signed)
Utilization review completed. Aalani Aikens, RN, BSN. 

## 2013-04-29 NOTE — Op Note (Signed)
Test test test  Preop diagnosis: Right knee osteoarthritis  Postop diagnosis: Right knee osteoarthritis  Procedure: Right cemented total knee arthroplasty, computer assist  Surgeon: Annell Greening M.D.  Assistant: Maud Deed PA-C medically necessary and present for the entire procedure  Anesthesia preoperative femoral block plus general anesthesia +20 cc Marcaine 1/2% plain local  Tourniquet time: 50 minutes x350  EBL: Less than 100 cc  Complications none  Components Depuy LCS rotating platform #3 femur with lugs 10 mm #3 Polly rotating platform 2.5 tibia 35 mm 3 PEG patella cemented.  Procedure: After induction general anesthesia with preoperative femoral nerve block standard prepping and draping Foley catheter insertion prior to prepping and draping in the lateral post heel bump was used DuraPrep and the prepping went down to the toes from the proximal thigh tourniquet which was applied over stockinette. Usual split sheets drapes impervious stockinette Caban sterile skin marker Betadine Steri-Drape was used to seal the skin. Timeout procedure was completed leg was wrapped an Esmarch and tourniquet was inflated at 350. Midline incision was made patella was everted taken 10 mm off the articular surface going from facet the facet sized and the 35mm was appropriate size.  There is bone-on-bone changes lateral compartment normal changes in the patellofemoral compartment with relative sparing of the medial compartment although there were medial osteophytes and some areas of grade 3 isolated grade 4 changes. Computer pins are placed mid tibial shaft the femoral pins are placed inside the incision and computer modules generated showed 7 obvious and 6 screws hyperextension as expected. 9  millimeters was taken off the distal femur, verified chamfer cuts are made box cut was not made until the tibia was resected and verified. A millimeters taken off the tibia he'll preparation sized to 2.5. Posterior  spurs removed off femur with a curved osteotome which is primarily lateral. PCL been resected. Box cut was prepared and then trials were inserted. There was good the flexion-extension balance the leg was straight on alignment extended to neutral position and the valgus had been corrected. Medial lateral gap less than 1/2 mm difference on the computer on both flexion and extension. Trials removed pulsatile lavage permanent components were opened hemostasis was maintained by the tourniquet knee was suctioned dry flex to 9 agreed patella tract are placed posteriorly tibia was cemented first followed by femur insertion of the permanent 10 mm Polly and then patellar component held with a clamp. All excessive cement had been removed. Cement was hard at 15 minutes tourniquet was deflated hemostasis was obtained in standard layered closure. This was a medial parapatellar incision and quad tendon split between the medial one third and lateral two thirds closed with nonabsorbable #1. 0 Vicryl standing superficial retinaculum subtendinous tissue and running septic or skin closure postop dressing and knee immobilizer was applied. Stab incision for the tibial pins were computer navigation was closed with 4-0 Vicryl. Patient tolerated procedure well transferred to her room stable condition. 20 cc Marcaine was infiltrated in the skin and into the joint at the end of the case for postoperative analgesia. Signed Annell Greening M.D.

## 2013-04-30 LAB — CBC
HCT: 29.5 % — ABNORMAL LOW (ref 36.0–46.0)
MCH: 23.3 pg — ABNORMAL LOW (ref 26.0–34.0)
MCHC: 31.9 g/dL (ref 30.0–36.0)
MCV: 73 fL — ABNORMAL LOW (ref 78.0–100.0)
Platelets: 212 10*3/uL (ref 150–400)
RDW: 20.8 % — ABNORMAL HIGH (ref 11.5–15.5)

## 2013-04-30 LAB — BASIC METABOLIC PANEL
BUN: 11 mg/dL (ref 6–23)
Calcium: 8.6 mg/dL (ref 8.4–10.5)
Creatinine, Ser: 0.79 mg/dL (ref 0.50–1.10)
GFR calc Af Amer: >90 mL/min (ref >90)
GFR calc non Af Amer: >90 mL/min (ref >90)

## 2013-04-30 LAB — GLUCOSE, CAPILLARY

## 2013-04-30 NOTE — Progress Notes (Signed)
Physical Therapy Treatment Patient Details Name: KATHYE CIPRIANI MRN: [id number] DOB: July 22, 1966 Today's Date: 04/30/2013 Time: 4098-1191 PT Time Calculation (min): 25 min  PT Assessment / Plan / Recommendation  History of Present Illness s/p elective R TKA       PT Comments   Patient not progressing this session. Patient somewhat self limiting with mobility. Tried to encourage patient but patient just requested to go back to bed. MD present throughout and was very helpful and attempted to be encouraging as well. Continue to attempt to progress mobility with patient  Follow Up Recommendations  Home health PT;Supervision/Assistance - 24 hour     Does the patient have the potential to tolerate intense rehabilitation     Barriers to Discharge        Equipment Recommendations  None recommended by PT    Recommendations for Other Services    Frequency 7X/week   Progress towards PT Goals Progress towards PT goals: Progressing toward goals  Plan Current plan remains appropriate    Precautions / Restrictions Precautions Precautions: Knee;Fall Required Braces or Orthoses: Knee Immobilizer - Right Knee Immobilizer - Right: Discontinue once straight leg raise with < 10 degree lag Restrictions RLE Weight Bearing: Weight bearing as tolerated   Pertinent Vitals/Pain 10/10 R knee pain. Premedicated   Mobility  Bed Mobility Supine to Sit: 3: Mod assist Details for Bed Mobility Assistance: A with LEs back into bed. Cues for technque Transfers Sit to Stand: 4: Min assist;From bed;With upper extremity assist;From chair/3-in-1 Stand to Sit: 4: Min assist;With armrests;To chair/3-in-1;With upper extremity assist Details for Transfer Assistance: pt required min (A) to achieve upright standing position and maintain balance; vc's for hand placement and safety with RW. Stood x2 Ambulation/Gait Ambulation/Gait Assistance: 4: Min Environmental consultant (Feet): 4 Feet Assistive device: Rolling  walker Ambulation/Gait Assistance Details: Patient walked from recliner to bed. Refused further ambulation due to pain Gait Pattern: Step-to pattern;Decreased stance time - left;Decreased step length - right;Decreased step length - left;Antalgic Gait velocity: decreased        PT Diagnosis:    PT Problem List:   PT Treatment Interventions:     PT Goals (current goals can now be found in the care plan section)    Visit Information  Last PT Received On: 04/30/13 Assistance Needed: +1 History of Present Illness: s/p elective R TKA     Subjective Data      Cognition  Cognition Arousal/Alertness: Awake/alert Behavior During Therapy: WFL for tasks assessed/performed Overall Cognitive Status: Within Functional Limits for tasks assessed    Balance     End of Session PT - End of Session Equipment Utilized During Treatment: Gait belt;Right knee immobilizer Activity Tolerance: Patient limited by pain Patient left: in bed;with call bell/phone within reach Nurse Communication: Mobility status;Patient requests pain meds   GP     Robinette, Adline Potter 04/30/2013, 1:27 PM 04/30/2013 Fredrich Birks PTA [phone] pager [phone] office

## 2013-04-30 NOTE — Progress Notes (Signed)
Subjective: 1 Day Post-Op Procedure(s) (LRB): COMPUTER ASSISTED TOTAL KNEE ARTHROPLASTY (Right) Awake, alert and oriented x 4. Painful right knee, feels swollen.  Patient reports pain as moderate.    Objective:   VITALS:  Temp:  [98.5 F (36.9 C)-99.2 F (37.3 C)] 98.9 F (37.2 C) (07/26 0658) Pulse Rate:  [90-96] 90 (07/26 0658) Resp:  [14-17] 16 (07/26 0658) BP: (131-164)/(70-89) 164/89 mmHg (07/26 0658) SpO2:  [95 %-100 %] 97 % (07/26 0658)  Neurologically intact ABD soft Neurovascular intact Sensation intact distally Intact pulses distally Dorsiflexion/Plantar flexion intact Incision: dressing C/D/I   LABS  Recent Labs  04/30/13 0410  HGB 9.4*  WBC 7.6  PLT 212    Recent Labs  04/30/13 0410  NA 135  K 4.0  CL 100  CO2 28  BUN 11  CREATININE 0.79  GLUCOSE 134*   No results found for this basename: LABPT, INR,  in the last 72 hours   Assessment/Plan: 1 Day Post-Op Procedure(s) (LRB): COMPUTER ASSISTED TOTAL KNEE ARTHROPLASTY (Right)  Advance diet Up with therapy Will discontinue dressing in the AM and expect improved movement with dressing off.  Salvador Coupe E 04/30/2013, 1:20 PM

## 2013-04-30 NOTE — Progress Notes (Signed)
Physical Therapy Treatment Patient Details Name: [name] MRN: [id number] DOB: 12-04-1965 Today's Date: 04/30/2013 Time: 3086-5784 PT Time Calculation (min): 29 min  PT Assessment / Plan / Recommendation  History of Present Illness s/p elective R TKA        PT Comments   Patient progressing slowly with PT this morning. Appears pain is biggest barrier to mobility. Encouragement provided and will plan to increase ambulation this afternoon as tolerable  Follow Up Recommendations  Home health PT;Supervision/Assistance - 24 hour     Does the patient have the potential to tolerate intense rehabilitation     Barriers to Discharge        Equipment Recommendations  None recommended by PT    Recommendations for Other Services    Frequency 7X/week   Progress towards PT Goals Progress towards PT goals: Progressing toward goals  Plan Current plan remains appropriate    Precautions / Restrictions Precautions Precautions: Knee;Fall Required Braces or Orthoses: Knee Immobilizer - Right Knee Immobilizer - Right: Discontinue once straight leg raise with < 10 degree lag Restrictions RLE Weight Bearing: Weight bearing as tolerated   Pertinent Vitals/Pain 8/10 R knee pain. Patient repositioned and premedicated    Mobility  Bed Mobility Supine to Sit: 4: Min assist;HOB flat;With rails Details for Bed Mobility Assistance: (A) to advance R LE to EOB; pt requires increased time and min cues for hand placement and sequencing  Transfers Sit to Stand: 4: Min assist;From bed;With upper extremity assist;From chair/3-in-1 Stand to Sit: 4: Min assist;With armrests;To chair/3-in-1;With upper extremity assist Details for Transfer Assistance: pt required min (A) to achieve upright standing position and maintain balance; vc's for hand placement and safety with RW  Ambulation/Gait Ambulation/Gait Assistance: 4: Min assist Ambulation Distance (Feet): 8 Feet Assistive device: Rolling  walker Ambulation/Gait Assistance Details: Patient able to take small steps to [facility] Cues for posture and RW positioning.  Gait Pattern: Step-to pattern;Decreased stance time - left;Decreased step length - right;Decreased step length - left;Antalgic Gait velocity: decreased    Exercises Total Joint Exercises Quad Sets: AROM;Right;5 reps Heel Slides: AAROM;Right;5 reps Hip ABduction/ADduction: AAROM;Right;5 reps Straight Leg Raises: AAROM;Right;5 reps   PT Diagnosis:    PT Problem List:   PT Treatment Interventions:     PT Goals (current goals can now be found in the care plan section)    Visit Information  Last PT Received On: 04/30/13 Assistance Needed: +1 History of Present Illness: s/p elective R TKA     Subjective Data      Cognition  Cognition Arousal/Alertness: Awake/alert Behavior During Therapy: WFL for tasks assessed/performed Overall Cognitive Status: Within Functional Limits for tasks assessed    Balance     End of Session PT - End of Session Equipment Utilized During Treatment: Gait belt;Right knee immobilizer Activity Tolerance: Patient limited by pain Patient left: in chair;with call bell/phone within reach Nurse Communication: Mobility status;Patient requests pain meds   GP     [name], [name] 04/30/2013, 11:55 AM 04/30/2013 Fredrich Birks PTA [phone] pager [phone] office

## 2013-05-01 LAB — CBC
HCT: 28.7 % — ABNORMAL LOW (ref 36.0–46.0)
Hemoglobin: 8.9 g/dL — ABNORMAL LOW (ref 12.0–15.0)
MCV: 73.4 fL — ABNORMAL LOW (ref 78.0–100.0)
Platelets: 197 10*3/uL (ref 150–400)
RBC: 3.91 MIL/uL (ref 3.87–5.11)
WBC: 8.8 10*3/uL (ref 4.0–10.5)

## 2013-05-01 LAB — GLUCOSE, CAPILLARY
Glucose-Capillary: 123 mg/dL — ABNORMAL HIGH (ref 70–99)
Glucose-Capillary: 72 mg/dL (ref 70–99)

## 2013-05-01 MED ORDER — OXYCODONE HCL ER 10 MG PO T12A
10.0000 mg | EXTENDED_RELEASE_TABLET | Freq: Two times a day (BID) | ORAL | Status: DC
  Administered 2013-05-01 – 2013-05-02 (×3): 10 mg via ORAL
  Filled 2013-05-01 (×3): qty 1

## 2013-05-01 NOTE — Progress Notes (Signed)
OT Cancellation Note  Patient Details Name: [name] MRN: [id number] DOB: April 23, 1966   Cancelled Treatment:    Reason Eval/Treat Not Completed: Patient declined, no reason specified. Will re-attempt next date.  05/01/2013 Cipriano Mile OTR/L Pager [phone] Office [phone]

## 2013-05-01 NOTE — Progress Notes (Signed)
PT Cancellation Note  Patient Details Name: [name] MRN: [id number] DOB: 11-Jun-1966   Cancelled Treatment:    Reason Eval/Treat Not Completed: Patient declined; Had just taken pain meds; Wanted to eat lunch   Van Clines Hamff 05/01/2013, 1:21 PM

## 2013-05-01 NOTE — Progress Notes (Signed)
Patient ID: [name], female   DOB: 05/09/1966, 47 y.o.   MRN: [id number] Subjective: 2 Days Post-Op Procedure(s) (LRB): COMPUTER ASSISTED TOTAL KNEE ARTHROPLASTY (Right) Awake, alert and oriented x 4. C/o pain. Tolerating po narcotic meds and po intake. Patient reports pain as moderate.    Objective:   VITALS:  Temp:  [98.6 F (37 C)-99 F (37.2 C)] 98.6 F (37 C) (07/27 0639) Pulse Rate:  [94-100] 98 (07/27 0639) Resp:  [16-18] 16 (07/27 0639) BP: (121-133)/(63-79) 121/63 mmHg (07/27 0639) SpO2:  [96 %-100 %] 98 % (07/27 4098)  Neurologically intact ABD soft Neurovascular intact Sensation intact distally Intact pulses distally Dorsiflexion/Plantar flexion intact Incision: scant drainage No cellulitis present Compartment soft Dressing removed.  LABS  Recent Labs  04/30/13 0410 05/01/13 0627  HGB 9.4* 8.9*  WBC 7.6 8.8  PLT 212 197    Recent Labs  04/30/13 0410  NA 135  K 4.0  CL 100  CO2 28  BUN 11  CREATININE 0.79  GLUCOSE 134*   No results found for this basename: LABPT, INR,  in the last 72 hours   Assessment/Plan: 2 Days Post-Op Procedure(s) (LRB): COMPUTER ASSISTED TOTAL KNEE ARTHROPLASTY (Right)  Advance diet Up with therapy Discharge home with home health likely [place] or Tues.   NITKA,JAMES E 05/01/2013, 12:03 PM

## 2013-05-01 NOTE — Progress Notes (Signed)
Physical Therapy Treatment Patient Details Name: [name] MRN: [id number] DOB: 1965-12-09 Today's Date: 05/01/2013 Time: 1229-1300 PT Time Calculation (min): 31 min  PT Assessment / Plan / Recommendation  History of Present Illness s/p elective R TKA    Clinical Impression Continues to need encouragement to participate, and much encouragement to push ambulation distance; Still, making progress with sit to stand transfers   PT Comments   Will need to continue to push therex and ROM; Noted pt tends to co-contract and guard with any motion of knee  Follow Up Recommendations  Home health PT;Supervision/Assistance - 24 hour     Does the patient have the potential to tolerate intense rehabilitation     Barriers to Discharge        Equipment Recommendations  None recommended by PT    Recommendations for Other Services    Frequency 7X/week   Progress towards PT Goals Progress towards PT goals: Progressing toward goals (Extremely slow progress with gait distance; limited by pain)  Plan Current plan remains appropriate    Precautions / Restrictions Precautions Precautions: Knee;Fall Precaution Comments: no pillow under knee Required Braces or Orthoses: Knee Immobilizer - Right Knee Immobilizer - Right: Discontinue once straight leg raise with < 10 degree lag Restrictions RLE Weight Bearing: Weight bearing as tolerated   Pertinent Vitals/Pain Very painful R knee RN provided medication to assist with pain control     Mobility  Bed Mobility Bed Mobility: Sit to Supine Sit to Supine: 4: Min assist Details for Bed Mobility Assistance: A with RLE back into bed. Cues for technque Transfers Transfers: Sit to Stand;Stand to Sit Sit to Stand: 4: Min assist;With upper extremity assist;From chair/3-in-1 Stand to Sit: 4: Min assist;With armrests;To chair/3-in-1;With upper extremity assist Details for Transfer Assistance: pt required min (A) to achieve upright standing position and  maintain balance; vc's for hand placement and safety with RW; Cues to get R heel to floor Ambulation/Gait Ambulation/Gait Assistance: 4: Min guard Ambulation Distance (Feet): 12 Feet Assistive device: Rolling walker Ambulation/Gait Assistance Details: Near constant cues to bear wieght through RLE, and get heel to floor; required numerous standing rest breaks (elbows propped on RW) Gait Pattern: Step-to pattern;Decreased step length - right;Decreased step length - left;Antalgic;Decreased stance time - right Gait velocity: decreased    Exercises Total Joint Exercises Quad Sets: AROM;Right;10 reps;Seated Heel Slides: AAROM;Right;5 reps Straight Leg Raises: AAROM;Right;10 reps;Seated   PT Diagnosis:    PT Problem List:   PT Treatment Interventions:     PT Goals (current goals can now be found in the care plan section) Acute Rehab PT Goals Patient Stated Goal: to be walking better by september so i can go to [place]  Time For Goal Achievement: 05/06/13 Potential to Achieve Goals: Good  Visit Information  Last PT Received On: 05/01/13 Assistance Needed: +1 Reason Eval/Treat Not Completed: Patient declined, no reason specified History of Present Illness: s/p elective R TKA     Subjective Data  Subjective: Reports is concerned re: stomach pains (and made reference to the possible need to move her bowels) Patient Stated Goal: to be walking better by september so i can go to [employer] Arousal/Alertness: Awake/alert Behavior During Therapy: [facility] for tasks assessed/performed;Flat affect (Requiring max encouragement for participation) Overall Cognitive Status: Within Functional Limits for tasks assessed    Balance     End of Session PT - End of Session Equipment Utilized During Treatment: Gait belt Activity Tolerance: Patient limited by  pain Patient left: in bed;in CPM;with call bell/phone within reach;with family/visitor present Nurse Communication: Mobility  status   GP     Van Clines [facility] [place], [place] [phone]  05/01/2013, 3:12 PM

## 2013-05-02 LAB — GLUCOSE, CAPILLARY

## 2013-05-02 LAB — CBC
Hemoglobin: 8.5 g/dL — ABNORMAL LOW (ref 12.0–15.0)
MCH: 23.2 pg — ABNORMAL LOW (ref 26.0–34.0)
MCHC: 31.7 g/dL (ref 30.0–36.0)

## 2013-05-02 NOTE — Progress Notes (Signed)
Physical Therapy Treatment Patient Details Name: DANNA SEWELL MRN: [id number] DOB: 09-Nov-1965 Today's Date: 05/02/2013 Time: 6578-4696 PT Time Calculation (min): 23 min  PT Assessment / Plan / Recommendation  History of Present Illness s/p elective R TKA    Clinical Impression    PT Comments   Patient showing great improvement with overall mobility today. Able to increase ambulation this morning. Patient is hopeful that she can return home later today  Follow Up Recommendations  Home health PT;Supervision/Assistance - 24 hour     Does the patient have the potential to tolerate intense rehabilitation     Barriers to Discharge        Equipment Recommendations  None recommended by PT    Recommendations for Other Services    Frequency 7X/week   Progress towards PT Goals Progress towards PT goals: Progressing toward goals  Plan Current plan remains appropriate    Precautions / Restrictions Precautions Precautions: Knee;Fall Precaution Comments: no pillow under knee Required Braces or Orthoses: Knee Immobilizer - Right Knee Immobilizer - Right: Discontinue once straight leg raise with < 10 degree lag Restrictions RLE Weight Bearing: Weight bearing as tolerated   Pertinent Vitals/Pain Complained of pain. Did not rate. Premedicated    Mobility  Bed Mobility Supine to Sit: 4: Min assist Details for Bed Mobility Assistance: A with RLE  Transfers Sit to Stand: 5: Supervision Stand to Sit: 5: Supervision Details for Transfer Assistance: no cues needed Ambulation/Gait Ambulation/Gait Assistance: 4: Min guard Ambulation Distance (Feet): 50 Feet Ambulation/Gait Assistance Details: Cues for posture and to increase weight through heel of RLE and extend knee Gait Pattern: Step-to pattern;Decreased step length - right;Decreased step length - left;Antalgic;Decreased stance time - right Gait velocity: decreased    Exercises Total Joint Exercises Quad Sets: AROM;Right;10  reps;Seated   PT Diagnosis:    PT Problem List:   PT Treatment Interventions:     PT Goals (current goals can now be found in the care plan section)    Visit Information  Last PT Received On: 05/02/13 Assistance Needed: +1 History of Present Illness: s/p elective R TKA     Subjective Data      Cognition  Cognition Arousal/Alertness: Awake/alert Behavior During Therapy: WFL for tasks assessed/performed;Flat affect Overall Cognitive Status: Within Functional Limits for tasks assessed    Balance     End of Session PT - End of Session Equipment Utilized During Treatment: Gait belt Activity Tolerance: Patient tolerated treatment well Patient left: with call bell/phone within reach;in chair Nurse Communication: Mobility status   GP     Nakiah, Osgood 05/02/2013, 11:54 AM 05/02/2013 Fredrich Birks PTA [phone] pager [phone] office

## 2013-05-02 NOTE — Evaluation (Signed)
Occupational Therapy Evaluation Patient Details Name: [name] MRN: [id number] DOB: Mar 26, 1966 Today's Date: 05/02/2013 Time: 4098-1191 OT Time Calculation (min): 30 min  OT Assessment / Plan / Recommendation History of present illness s/p elective R TKA    Clinical Impression   Pt is s/p R TKA resulting in the deficits listed below (see OT Problem List).  Pt will benefit from skilled OT to increase their independence and safety prior to d/c. Unsure if pt's mom is staying with her 24/7. Pt's husband works during the day. Recommend HHOT upon d/c.      OT Assessment  Patient needs continued OT Services    Follow Up Recommendations  Home health OT;Supervision/Assistance - 24 hour    Barriers to Discharge      Equipment Recommendations  None recommended by OT    Recommendations for Other Services    Frequency  Min 2X/week    Precautions / Restrictions Precautions Precautions: Knee;Fall Precaution Comments: no pillow under knee Required Braces or Orthoses: Knee Immobilizer - Right Knee Immobilizer - Right: Discontinue once straight leg raise with < 10 degree lag Restrictions Weight Bearing Restrictions: Yes RLE Weight Bearing: Weight bearing as tolerated   Pertinent Vitals/Pain C/o pain but did not rate. Repositioned.     ADL  Eating/Feeding: Independent Where Assessed - Eating/Feeding: Chair Upper Body Dressing: Performed;Set up Where Assessed - Upper Body Dressing: Unsupported sitting Lower Body Dressing: Performed;Supervision/safety Where Assessed - Lower Body Dressing: Supported sit to stand Toilet Transfer: Research scientist (life sciences) Method: Sit to Barista: Comfort height toilet;Grab bars Toileting - Architect and Hygiene: Supervision/safety Where Assessed - Engineer, mining and Hygiene: Sit to stand from 3-in-1 or toilet Tub/Shower Transfer Method: Not assessed Equipment Used: Gait  belt;Rolling walker Transfers/Ambulation Related to ADLs: Minguard for ambulation. Supervision level for transfers ADL Comments: Pt able to reach to don/doff right sock while sitting in chair. Pt donned shorts and underwear at supervision level. Educated to have bed/chair behind her with walker in front for safety. Pt has walk in shower with no threshold. Educated to sit in chair to bathe and have someone with her.     OT Diagnosis: Acute pain  OT Problem List: Decreased strength;Decreased range of motion;Decreased knowledge of use of DME or AE;Decreased knowledge of precautions;Pain OT Treatment Interventions: Self-care/ADL training;DME and/or AE instruction;Therapeutic activities;Patient/family education   OT Goals(Current goals can be found in the care plan section) Acute Rehab OT Goals Patient Stated Goal: to be walking better by september so i can go to [place]  OT Goal Formulation: With patient Time For Goal Achievement: 05/09/13 Potential to Achieve Goals: Good ADL Goals Pt Will Perform Lower Body Bathing: with modified independence;sit to/from stand Pt Will Perform Lower Body Dressing: with modified independence;sit to/from stand Pt Will Transfer to Toilet: with modified independence;ambulating (comfort height) Pt Will Perform Toileting - Clothing Manipulation and hygiene: with modified independence;sit to/from stand  Visit Information  Last OT Received On: 05/02/13 Assistance Needed: +1 PT/OT Co-Evaluation/Treatment: Yes History of Present Illness: s/p elective R TKA        Prior Functioning     Home Living Family/patient expects to be discharged to:: Private residence Living Arrangements: Spouse/significant other;Children Available Help at Discharge: Family (spouse works and mom is going to help but unsure if it's 24/) Type of Home: House Home Access: Level entry Home Layout: One level Home Equipment: Grab bars - tub/shower;Grab bars - toilet;Hand held shower  head;Bedside commode;Walker - 2 wheels;Walker - 4  wheels (has lawnchair she can use; has handicap toilet seat) Additional Comments: pt has handicap son that she takes care of; house is handicap acessible  Prior Function Level of Independence: Independent Comments: 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 Communication Communication: No difficulties Dominant Hand: Left         Vision/Perception     Cognition  Cognition Arousal/Alertness: Awake/alert Behavior During Therapy: WFL for tasks assessed/performed;Flat affect Overall Cognitive Status: Within Functional Limits for tasks assessed    Extremity/Trunk Assessment Upper Extremity Assessment Upper Extremity Assessment: Overall WFL for tasks assessed     Mobility Bed Mobility Bed Mobility: Supine to Sit;Sitting - Scoot to Edge of Bed Supine to Sit: 4: Min assist;HOB elevated Sitting - Scoot to [place] of Bed: 4: Min assist Details for Bed Mobility Assistance: A with RLE Transfers Transfers: Sit to Stand;Stand to Sit Sit to Stand: 5: Supervision;With upper extremity assist;From bed;From toilet Stand to Sit: 5: Supervision;With upper extremity assist;To chair/3-in-1;To toilet Details for Transfer Assistance: supervision for safety        Balance     End of Session OT - End of Session Equipment Utilized During Treatment: Gait belt;Rolling walker Activity Tolerance: Patient tolerated treatment well Patient left: in chair;with call bell/phone within reach  GO     Earlie Raveling OTR/L [phone] 05/02/2013, 12:16 PM

## 2013-05-02 NOTE — Progress Notes (Signed)
Physical Therapy Treatment Patient Details Name: [name] MRN: [id number] DOB: [birth year] Today's Date: 05/02/2013 Time: 6213-0865 PT Time Calculation (min): 19 min  PT Assessment / Plan / Recommendation  History of Present Illness s/p elective R TKA    Clinical Impression    PT Comments   Completed stair training. Anticipate DC today  Follow Up Recommendations  Home health PT;Supervision/Assistance - 24 hour     Does the patient have the potential to tolerate intense rehabilitation     Barriers to Discharge        Equipment Recommendations  None recommended by PT    Recommendations for Other Services    Frequency 7X/week   Progress towards PT Goals Progress towards PT goals: Progressing toward goals  Plan Current plan remains appropriate    Precautions / Restrictions Precautions Precautions: Knee;Fall Precaution Comments: no pillow under knee Required Braces or Orthoses: Knee Immobilizer - Right Knee Immobilizer - Right: Discontinue once straight leg raise with < 10 degree lag Restrictions Weight Bearing Restrictions: Yes RLE Weight Bearing: Weight bearing as tolerated   Pertinent Vitals/Pain no apparent distress     Mobility  Bed Mobility Bed Mobility: Supine to Sit;Sitting - Scoot to Edge of Bed Supine to Sit: 4: Min assist;HOB elevated Sitting - Scoot to [place] of Bed: 4: Min assist Sit to Supine: 4: Min assist Details for Bed Mobility Assistance: A with RLE Transfers Sit to Stand: 5: Supervision;With upper extremity assist;From chair/3-in-1;From bed Stand to Sit: 5: Supervision;With upper extremity assist;To chair/3-in-1;To bed Details for Transfer Assistance: good technique. Supervision for safety Ambulation/Gait Ambulation/Gait Assistance: 5: Supervision Ambulation Distance (Feet): 90 Feet Assistive device: Rolling walker Ambulation/Gait Assistance Details: Cues for posture and to increase weight through heel of RLE and extend knee Gait Pattern:  Step-to pattern;Decreased step length - right;Decreased step length - left;Antalgic;Decreased stance time - right Gait velocity: decreased Stairs: Yes Stairs Assistance: 4: Min guard Stairs Assistance Details (indicate cue type and reason): Practiced x2 with cues for technique and sequency. Will go backwards as it was easier on her.  Stair Management Technique: Step to pattern;Backwards;Forwards;With walker Number of Stairs: 1    Exercises Total Joint Exercises Quad Sets: AROM;Right;10 reps;Seated Heel Slides: AAROM;Right;10 reps Hip ABduction/ADduction: AAROM;Right;10 reps Straight Leg Raises: AAROM;Right;10 reps;Seated   PT Diagnosis:    PT Problem List:   PT Treatment Interventions:     PT Goals (current goals can now be found in the care plan section) Acute Rehab PT Goals Patient Stated Goal: to be walking better by september so i can go to [place]   Visit Information  Last PT Received On: 05/02/13 Assistance Needed: +1 History of Present Illness: s/p elective R TKA     Subjective Data  Patient Stated Goal: to be walking better by september so i can go to [employer] Arousal/Alertness: Awake/alert Behavior During Therapy: WFL for tasks assessed/performed Overall Cognitive Status: Within Functional Limits for tasks assessed    Balance     End of Session PT - End of Session Equipment Utilized During Treatment: Gait belt Activity Tolerance: Patient tolerated treatment well Patient left: in bed;in CPM;with call bell/phone within reach Nurse Communication: Mobility status CPM Right Knee CPM Right Knee: On Right Knee Flexion (Degrees): 45 Right Knee Extension (Degrees): 0   GP     Robinette, Adline Potter 05/02/2013, 1:31 PM 05/02/2013 Fredrich Birks PTA [phone] pager [phone] office

## 2013-05-02 NOTE — Progress Notes (Signed)
Physical Therapy Treatment Patient Details Name: [name] MRN: [id number] DOB: 1966-06-25 Today's Date: 05/02/2013 Time: 1100-1116 PT Time Calculation (min): 16 min  PT Assessment / Plan / Recommendation  History of Present Illness s/p elective R TKA    Clinical Impression    PT Comments   Pt informed me at end of session that she has a step to enter the house. Too fatiqued to attempt at this time. I will come back and instruct on step training.   Follow Up Recommendations  Home health PT;Supervision/Assistance - 24 hour     Does the patient have the potential to tolerate intense rehabilitation     Barriers to Discharge        Equipment Recommendations  None recommended by PT    Recommendations for Other Services    Frequency 7X/week   Progress towards PT Goals Progress towards PT goals: Progressing toward goals  Plan Current plan remains appropriate    Precautions / Restrictions Precautions Precautions: Knee;Fall Precaution Comments: no pillow under knee Required Braces or Orthoses: Knee Immobilizer - Right Knee Immobilizer - Right: Discontinue once straight leg raise with < 10 degree lag Restrictions Weight Bearing Restrictions: Yes RLE Weight Bearing: Weight bearing as tolerated   Pertinent Vitals/Pain no apparent distress     Mobility  Bed Mobility Bed Mobility: Supine to Sit;Sitting - Scoot to Edge of Bed Supine to Sit: 4: Min assist;HOB elevated Sitting - Scoot to [place] of Bed: 4: Min assist Details for Bed Mobility Assistance: A with RLE Transfers Sit to Stand: 5: Supervision;With upper extremity assist;From toilet;From chair/3-in-1 Stand to Sit: 5: Supervision;With upper extremity assist;To chair/3-in-1;To toilet Details for Transfer Assistance: supervision for safety Ambulation/Gait Ambulation/Gait Assistance: 4: Min guard Ambulation Distance (Feet): 90 Feet Assistive device: Rolling walker Ambulation/Gait Assistance Details: Cues for posture and  to increase weight through heel of RLE and extend knee Gait Pattern: Step-to pattern;Decreased step length - right;Decreased step length - left;Antalgic;Decreased stance time - right Gait velocity: decreased    Exercises Total Joint Exercises Quad Sets: AROM;Right;10 reps;Seated Heel Slides: AAROM;Right;10 reps Hip ABduction/ADduction: AAROM;Right;10 reps Straight Leg Raises: AAROM;Right;10 reps;Seated   PT Diagnosis:    PT Problem List:   PT Treatment Interventions:     PT Goals (current goals can now be found in the care plan section) Acute Rehab PT Goals Patient Stated Goal: to be walking better by september so i can go to [place]   Visit Information  Last PT Received On: 05/02/13 Assistance Needed: +1 History of Present Illness: s/p elective R TKA     Subjective Data  Patient Stated Goal: to be walking better by september so i can go to [employer] Arousal/Alertness: Awake/alert Behavior During Therapy: WFL for tasks assessed/performed Overall Cognitive Status: Within Functional Limits for tasks assessed    Balance     End of Session PT - End of Session Equipment Utilized During Treatment: Gait belt Activity Tolerance: Patient tolerated treatment well Patient left: with call bell/phone within reach;in chair Nurse Communication: Mobility status   GP     Keirston, Saephanh 05/02/2013, 1:22 PM 05/02/2013 Fredrich Birks PTA [phone] pager [phone] office

## 2013-05-02 NOTE — Progress Notes (Signed)
Subjective: 3 Days Post-Op Procedure(s) (LRB): COMPUTER ASSISTED TOTAL KNEE ARTHROPLASTY (Right) Patient reports pain as 3 on 0-10 scale.    Objective: Vital signs in last 24 hours: Temp:  [98 F (36.7 C)-100.2 F (37.9 C)] 98 F (36.7 C) (07/28 0555) Pulse Rate:  [87-99] 87 (07/28 0555) Resp:  [16-18] 16 (07/28 0555) BP: (94-144)/(53-76) 94/53 mmHg (07/28 0555) SpO2:  [97 %-100 %] 100 % (07/28 0555)  Intake/Output from previous day: 07/27 0701 - 07/28 0700 In: 960 [P.O.:960] Out: -  Intake/Output this shift:     Recent Labs  04/30/13 0410 05/01/13 0627 05/02/13 0440  HGB 9.4* 8.9* 8.5*    Recent Labs  05/01/13 0627 05/02/13 0440  WBC 8.8 6.7  RBC 3.91 3.66*  HCT 28.7* 26.8*  PLT 197 198    Recent Labs  04/30/13 0410  NA 135  K 4.0  CL 100  CO2 28  BUN 11  CREATININE 0.79  GLUCOSE 134*  CALCIUM 8.6   No results found for this basename: LABPT, INR,  in the last 72 hours  Neurologically intact  Assessment/Plan: 3 Days Post-Op Procedure(s) (LRB): COMPUTER ASSISTED TOTAL KNEE ARTHROPLASTY (Right) Up with therapy saline lock out last PM,  Likely home this afternoon.  HHPT  . NO CPM.  Ambulating to BR. Small bowel movement. Pain has slowed progress, but sleeps after pain meds.   Elisea Khader C 05/02/2013, 7:55 AM

## 2013-05-02 NOTE — Progress Notes (Signed)
05/02/13 Spoke with patient about HHC, she selected [facility] which she had worked with previously. Contacted Debbie with [facility] and set up HHPT and HHOT. Patient states that she has a rolling walker and a BSC. No equipment needs identified. Patient states that she will have assistance at home. Jacquelynn Cree RN, BSN, CCM

## 2013-05-03 ENCOUNTER — Encounter (HOSPITAL_COMMUNITY): Payer: Self-pay | Admitting: Orthopaedic Surgery

## 2013-05-16 DIAGNOSIS — D62 Acute posthemorrhagic anemia: Secondary | ICD-10-CM | POA: Diagnosis not present

## 2013-05-16 NOTE — Discharge Summary (Signed)
Physician Discharge Summary  Patient ID: [name] MRN: [id number] DOB/AGE: 14-Oct-1965 47 y.o.  Admit date: 04/29/2013 Discharge date: 05/02/2013  Admission Diagnoses:  Osteoarthritis of right knee  Discharge Diagnoses:  Principal Problem:   Osteoarthritis of right knee Active Problems:   Acute blood loss anemia   Past Medical History  Diagnosis Date  . ALLERGIC RHINITIS 10/08/2007  . ASTHMA 04/22/2007  . GERD 04/22/2007  . Headache(784.0) 10/08/2007  . HYPERTENSION 04/22/2007  . HYPERLIPIDEMIA 04/22/2007  . HYPERTENSION 04/22/2007  . LOW BACK PAIN 06/27/2008  . LUQ PAIN 09/06/2010  . NAUSEA 09/06/2010  . IBS (irritable bowel syndrome)     constipation predominant  . PONV (postoperative nausea and vomiting)   . Diabetes mellitus without complication   . Arthritis     Surgeries: Procedure(s): COMPUTER ASSISTED TOTAL KNEE ARTHROPLASTY on 04/29/2013   Consultants (if any):  none  Discharged Condition: Improved  Hospital Course: [name] is an 47 y.o. female who was admitted 04/29/2013 with a diagnosis of Osteoarthritis of right knee and went to the operating room on 04/29/2013 and underwent the above named procedures.    She was given perioperative antibiotics:      Anti-infectives   Start     Dose/Rate Route Frequency Ordered Stop   04/29/13 2000  vancomycin (VANCOCIN) IVPB 1000 mg/200 mL premix     1,000 mg 200 mL/hr over 60 Minutes Intravenous Every 12 hours 04/29/13 1242 04/29/13 2056   04/29/13 0600  vancomycin (VANCOCIN) IVPB 1000 mg/200 mL premix     1,000 mg 200 mL/hr over 60 Minutes Intravenous On call to O.R. 04/28/13 1453 04/29/13 0813    .  She was given sequential compression devices, early ambulation, and aspirin for DVT prophylaxis.  She benefited maximally from the hospital stay and there were no complications.    Recent vital signs:  Filed Vitals:   05/02/13 1600  BP:   Pulse:   Temp:   Resp: 18    Recent laboratory studies:  Lab  Results  Component Value Date   HGB 8.5* 05/02/2013   HGB 8.9* 05/01/2013   HGB 9.4* 04/30/2013   Lab Results  Component Value Date   WBC 6.7 05/02/2013   PLT 198 05/02/2013   Lab Results  Component Value Date   INR 0.98 04/27/2013   Lab Results  Component Value Date   NA 135 04/30/2013   K 4.0 04/30/2013   CL 100 04/30/2013   CO2 28 04/30/2013   BUN 11 04/30/2013   CREATININE 0.79 04/30/2013   GLUCOSE 134* 04/30/2013    Discharge Medications:     Medication List         albuterol 108 (90 BASE) MCG/ACT inhaler  Commonly known as:  PROVENTIL HFA;VENTOLIN HFA  Inhale 2 puffs into the lungs 4 (four) times daily as needed for wheezing or shortness of breath.     aspirin EC 325 MG tablet  Take 1 tablet (325 mg total) by mouth daily.     cetirizine-pseudoephedrine 5-120 MG per tablet  Commonly known as:  ZYRTEC-D  Take 1 tablet by mouth daily as needed for allergies.     cyclobenzaprine 10 MG tablet  Commonly known as:  FLEXERIL  Take 10 mg by mouth 3 (three) times daily as needed for muscle spasms.     cyclobenzaprine 10 MG tablet  Commonly known as:  FLEXERIL  Take 10 mg by mouth 3 (three) times daily as needed for muscle spasms.  FIORICET 50-325-40 MG per tablet  Generic drug:  butalbital-acetaminophen-caffeine  Take 1 tablet by mouth 2 (two) times daily as needed for headache.     glimepiride 2 MG tablet  Commonly known as:  AMARYL  Take 1 tablet (2 mg total) by mouth daily before breakfast.     lisinopril-hydrochlorothiazide 10-12.5 MG per tablet  Commonly known as:  PRINZIDE,ZESTORETIC  Take 1 tablet by mouth daily.     lovastatin 40 MG tablet  Commonly known as:  MEVACOR  Take 1 tablet (40 mg total) by mouth at bedtime.     mineral oil liquid  Take 30 mLs by mouth daily as needed for constipation.     montelukast 10 MG tablet  Commonly known as:  SINGULAIR  Take 1 tablet (10 mg total) by mouth daily.     multivitamin tablet  Take 1 tablet by mouth  daily.     omeprazole-sodium bicarbonate 40-1100 MG per capsule  Commonly known as:  ZEGERID  Take 1 capsule by mouth daily.     OVER THE COUNTER MEDICATION  Take 1 capsule by mouth daily. "Amberen" weight loss supplement     OVER THE COUNTER MEDICATION  Take 2 capsules by mouth daily. "Novoslim" weight loss supplement     oxyCODONE-acetaminophen 5-325 MG per tablet  Commonly known as:  ROXICET  Take 1-2 tablets by mouth every 4 (four) hours as needed for pain.        Diagnostic Studies: No results found.  Disposition: 01-Home or Self Care  Discharge Orders   Future Appointments Provider Department Dept Phone   05/18/2013 10:15 AM Lorrene Reid, PT [facility] [phone]   Future Orders Complete By Expires     Full weight bearing  As directed      Keep knee incision dry for 5 days post op then may wet while bathing. Change dressing daily or as needed. Therapy daily and  goal full extension and greater than 90 degrees flexion. Call if fever or chills or increased drainage. Go to ER if acutely short of breath or call for ambulance. Return for follow up in 2 weeks. May full weight bear on the surgical leg unless told otherwise. Use knee immobilizer until able to straight leg raise off bed with knee stable. In house walking for first 2 weeks.  Follow-up Information   Schedule an appointment as soon as possible for a visit with Eldred Manges, MD. (or as scheduled)    Contact information:   [address] [place] [address] [phone]       Please follow up.   Contact information:   [facility]  [phone] home physical therapy, occupational therapy they will contact you       Signed: Wende Neighbors 05/16/2013, 10:24 AM

## 2013-05-18 ENCOUNTER — Ambulatory Visit: Payer: BC Managed Care – PPO | Attending: Orthopaedic Surgery

## 2013-05-18 ENCOUNTER — Other Ambulatory Visit: Payer: Self-pay | Admitting: Internal Medicine

## 2013-05-18 DIAGNOSIS — M25669 Stiffness of unspecified knee, not elsewhere classified: Secondary | ICD-10-CM | POA: Insufficient documentation

## 2013-05-18 DIAGNOSIS — R609 Edema, unspecified: Secondary | ICD-10-CM | POA: Insufficient documentation

## 2013-05-18 DIAGNOSIS — R262 Difficulty in walking, not elsewhere classified: Secondary | ICD-10-CM | POA: Insufficient documentation

## 2013-05-18 DIAGNOSIS — IMO0001 Reserved for inherently not codable concepts without codable children: Secondary | ICD-10-CM | POA: Insufficient documentation

## 2013-05-18 DIAGNOSIS — M25569 Pain in unspecified knee: Secondary | ICD-10-CM | POA: Insufficient documentation

## 2013-05-19 ENCOUNTER — Ambulatory Visit: Payer: BC Managed Care – PPO

## 2013-05-23 ENCOUNTER — Ambulatory Visit: Payer: BC Managed Care – PPO | Admitting: Physical Therapy

## 2013-05-24 ENCOUNTER — Ambulatory Visit: Payer: BC Managed Care – PPO

## 2013-05-25 ENCOUNTER — Ambulatory Visit: Payer: BC Managed Care – PPO | Admitting: Physical Therapy

## 2013-05-27 ENCOUNTER — Ambulatory Visit: Payer: BC Managed Care – PPO | Admitting: Physical Therapy

## 2013-05-30 ENCOUNTER — Ambulatory Visit: Payer: BC Managed Care – PPO | Admitting: Physical Therapy

## 2013-06-01 ENCOUNTER — Ambulatory Visit: Payer: BC Managed Care – PPO | Admitting: Physical Therapy

## 2013-06-03 ENCOUNTER — Ambulatory Visit: Payer: BC Managed Care – PPO | Admitting: Physical Therapy

## 2013-06-07 ENCOUNTER — Ambulatory Visit: Payer: BC Managed Care – PPO | Attending: Orthopaedic Surgery

## 2013-06-07 DIAGNOSIS — R262 Difficulty in walking, not elsewhere classified: Secondary | ICD-10-CM | POA: Insufficient documentation

## 2013-06-07 DIAGNOSIS — R609 Edema, unspecified: Secondary | ICD-10-CM | POA: Insufficient documentation

## 2013-06-07 DIAGNOSIS — M25669 Stiffness of unspecified knee, not elsewhere classified: Secondary | ICD-10-CM | POA: Insufficient documentation

## 2013-06-07 DIAGNOSIS — IMO0001 Reserved for inherently not codable concepts without codable children: Secondary | ICD-10-CM | POA: Insufficient documentation

## 2013-06-07 DIAGNOSIS — M25569 Pain in unspecified knee: Secondary | ICD-10-CM | POA: Insufficient documentation

## 2013-06-08 ENCOUNTER — Other Ambulatory Visit: Payer: Self-pay | Admitting: *Deleted

## 2013-06-08 MED ORDER — MONTELUKAST SODIUM 10 MG PO TABS: 10.0000 mg | ORAL_TABLET | Freq: Every day | ORAL | Status: DC

## 2013-06-09 ENCOUNTER — Ambulatory Visit: Payer: BC Managed Care – PPO | Admitting: Physical Therapy

## 2013-06-13 ENCOUNTER — Ambulatory Visit: Payer: BC Managed Care – PPO | Admitting: Physical Therapy

## 2013-06-15 ENCOUNTER — Ambulatory Visit: Payer: BC Managed Care – PPO | Admitting: Physical Therapy

## 2013-06-17 ENCOUNTER — Ambulatory Visit: Payer: BC Managed Care – PPO | Admitting: Physical Therapy

## 2013-06-20 ENCOUNTER — Ambulatory Visit: Payer: BC Managed Care – PPO | Admitting: Physical Therapy

## 2013-06-22 ENCOUNTER — Ambulatory Visit: Payer: BC Managed Care – PPO | Admitting: Physical Therapy

## 2013-06-23 ENCOUNTER — Encounter: Payer: BC Managed Care – PPO | Admitting: Physical Therapy

## 2013-06-27 ENCOUNTER — Ambulatory Visit: Payer: BC Managed Care – PPO | Admitting: Physical Therapy

## 2013-06-28 ENCOUNTER — Other Ambulatory Visit: Payer: Self-pay | Admitting: Internal Medicine

## 2013-06-28 ENCOUNTER — Other Ambulatory Visit: Payer: Self-pay

## 2013-06-28 MED ORDER — BUTALBITAL-APAP-CAFFEINE 50-325-40 MG PO TABS: 1.0000 | ORAL_TABLET | Freq: Two times a day (BID) | ORAL | Status: DC | PRN

## 2013-06-30 ENCOUNTER — Ambulatory Visit: Payer: BC Managed Care – PPO

## 2013-07-24 ENCOUNTER — Other Ambulatory Visit: Payer: Self-pay | Admitting: Internal Medicine

## 2013-07-26 ENCOUNTER — Ambulatory Visit (INDEPENDENT_AMBULATORY_CARE_PROVIDER_SITE_OTHER): Payer: BC Managed Care – PPO | Admitting: Podiatry

## 2013-07-26 ENCOUNTER — Encounter: Payer: Self-pay | Admitting: Podiatry

## 2013-07-26 VITALS — BP 159/102 | HR 99 | Resp 12 | Ht 63.0 in | Wt 160.0 lb

## 2013-07-26 DIAGNOSIS — L608 Other nail disorders: Secondary | ICD-10-CM

## 2013-07-26 DIAGNOSIS — L600 Ingrowing nail: Secondary | ICD-10-CM

## 2013-07-26 NOTE — Patient Instructions (Signed)

## 2013-07-26 NOTE — Progress Notes (Signed)
  Subjective:    Patient ID: [name], female    DOB: [birth year], 47 y.o.   MRN: [id number]  HPI Comments: '' LT FOOT IST TOENAIL IS BEEN THICK FOR 6 YEARS, BUT IT DOES NOT HURT. I TRY OVER THE COUNTER MEDS. BUT DID NOT HELP''     Review of Systems     Objective:   Physical Exam        Assessment & Plan:

## 2013-07-26 NOTE — Progress Notes (Signed)
[name] presents today as a 47 year old white female with a chief complaint of a painful hallux nail left. She states it bothers her with shoe gear and on ambulation. States has been thick like this for about the past 6 years she tried over-the-counter medications but they did not help.  Objective: I have reviewed her past medical history medications allergies and review of systems. Vital signs are stable she is alert and oriented x3. Lower extremity examination reveals strong palpable pulses bilateral and neurologic sensorium is intact bilateral. Deep tendon reflexes are intact bilateral and symmetrical. Muscle strength is normal bilateral. Cutaneous evaluation demonstrates supple while hydrated cutis thick dystrophic nail hallux left with distal onychomycosis. Orthopedic evaluation demonstrates all joints distal to the ankle a full range of motion without crepitus the exception of hammertoe and mallet toe deformities bilateral.  Assessment: Dystrophic nail hallux left  Plan: We discussed etiology pathology conservative versus surgical therapies. At this point total nail avulsion was performed. This is performed after 3 cc of a 50-50 mixture Marcaine plain lidocaine plain were infiltrated in a digital block hallux left the nail was avulsed and total. All necrotic tissue was sharply resected. Dressed a compressive dressing was applied. Patient was given both oral and written home-going instructions for the care of the toe as well as soaking instructions. All with her in one week.

## 2013-08-01 ENCOUNTER — Encounter: Payer: Self-pay | Admitting: Internal Medicine

## 2013-08-01 ENCOUNTER — Ambulatory Visit (INDEPENDENT_AMBULATORY_CARE_PROVIDER_SITE_OTHER): Payer: BC Managed Care – PPO | Admitting: Internal Medicine

## 2013-08-01 VITALS — BP 130/86 | HR 93 | Temp 97.7°F | Wt 158.8 lb

## 2013-08-01 DIAGNOSIS — J019 Acute sinusitis, unspecified: Secondary | ICD-10-CM

## 2013-08-01 DIAGNOSIS — F411 Generalized anxiety disorder: Secondary | ICD-10-CM | POA: Insufficient documentation

## 2013-08-01 MED ORDER — CIPROFLOXACIN HCL 500 MG PO TABS: 500.0000 mg | ORAL_TABLET | Freq: Two times a day (BID) | ORAL | Status: DC

## 2013-08-01 MED ORDER — FLUOXETINE HCL 20 MG PO TABS: 20.0000 mg | ORAL_TABLET | Freq: Every day | ORAL | Status: DC

## 2013-08-01 NOTE — Patient Instructions (Signed)
1. Sinus infection - the usual Plan cipro 500 mg twice a day for 7 days  Supportive care  2. Psych - distractable, irritable, anhedonia (lack joy and excitement), stressed Plan Trial of fluoxetine 20 mg once a day - will see a gradual improvement over about a week.   MyChart message me if you have any problems.

## 2013-08-01 NOTE — Assessment & Plan Note (Signed)
Recurrent symptoms of sinus infection.  Plan Cipro 500 mg bid x 7 days.  Supportive care.

## 2013-08-01 NOTE — Assessment & Plan Note (Signed)
Probable anxiety with symptoms of minor depression - October '14: irritability, anhedonia, perseveration, distractability. Her symptoms make her feel bad.  Plan Trial of fluoxetine 20 mg daily for symptoms management  Feed back via MyChart message.

## 2013-08-01 NOTE — Progress Notes (Signed)
  Subjective:    Patient ID: [name], female    DOB: 1966/06/28, 47 y.o.   MRN: [id number]  HPI Recent past she had Right TKR July 25th - doing ok. Still with some discomfort but better. Had left great toe nail avulsed - really painful afterwards.   She is having sinus pressure with rhinorrhea, thick, metallic bad tasting. Felt feverish last night. Coughing but w/o purulent sputum  Past Medical History  Diagnosis Date  . ALLERGIC RHINITIS 10/08/2007  . ASTHMA 04/22/2007  . GERD 04/22/2007  . Headache(784.0) 10/08/2007  . HYPERTENSION 04/22/2007  . HYPERLIPIDEMIA 04/22/2007  . HYPERTENSION 04/22/2007  . LOW BACK PAIN 06/27/2008  . LUQ PAIN 09/06/2010  . NAUSEA 09/06/2010  . IBS (irritable bowel syndrome)     constipation predominant  . PONV (postoperative nausea and vomiting)   . Diabetes mellitus without complication   . Arthritis    Past Surgical History  Procedure Laterality Date  . Knee surgery  92, 96, 01, 08    right  . Knee surgery  88    left  . Nasal polyp surgery  95    sinus  . Lumbar fusion  05    L4-5 S1  . Tubal ligation    . Cesarean section    . Knee arthroplasty Right 04/29/2013    Procedure: COMPUTER ASSISTED TOTAL KNEE ARTHROPLASTY;  Surgeon: Eldred Manges, MD;  Location: [facility] OR;  Service: Orthopedics;  Laterality: Right;  Right Total Knee Arthroplasty, Cemented   Family History  Problem Relation Age of Onset  . Hypertension Mother   . Arthritis Mother     OA s/p bilateral hip replacement  . Diabetes Father   . Heart disease Father     CAD  . Hyperlipidemia Father   . Hypertension Father   . Pulmonary fibrosis Father   . Osteopenia Sister     older with GI problems s/p colectomy  . Cancer Maternal Grandmother     Ovarian Cancer  . Osteopenia Sister    History   Social History  . Marital Status: Married    Spouse Name: N/A    Number of Children: 2  . Years of Education: 16   Occupational History  . substitute teacher [employer]    Social History Main Topics  . Smoking status: Never Smoker   . Smokeless tobacco: Never Used  . Alcohol Use: No  . Drug Use: No  . Sexual Activity: Yes    Partners: Male   Other Topics Concern  . Not on file   Social History Narrative   [employer]. Married 96'. 1 son '98, 1 daughter '08. 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 SO in good health. Work - Photographer - data and Naval architect. Daily Caffeine Use       Review of Systems System review is negative for any constitutional, cardiac, pulmonary, GI or neuro symptoms or complaints other than as described in the HPI.     Objective:   Physical Exam Filed Vitals:   08/01/13 1534  BP: 130/86  Pulse: 93  Temp: 97.7 F (36.5 C)   gen'l- WNWD woman in no distress HEENT- very tender sinuses to percussion, TM's normal, throat clear Pulm - lungs are clear Psych - distractable, irritable, perseverates, anxious       Assessment & Plan:

## 2013-08-09 ENCOUNTER — Ambulatory Visit (INDEPENDENT_AMBULATORY_CARE_PROVIDER_SITE_OTHER): Payer: BC Managed Care – PPO | Admitting: Podiatry

## 2013-08-09 ENCOUNTER — Encounter: Payer: Self-pay | Admitting: Podiatry

## 2013-08-09 VITALS — BP 138/77 | HR 107 | Resp 18 | Ht 63.0 in | Wt 156.0 lb

## 2013-08-09 DIAGNOSIS — Z9889 Other specified postprocedural states: Secondary | ICD-10-CM

## 2013-08-09 DIAGNOSIS — L600 Ingrowing nail: Secondary | ICD-10-CM

## 2013-08-09 NOTE — Progress Notes (Signed)
[name] presents today for a followup of a nail avulsion hallux left. She thinks that she may have him cut it with her fingernail by accident. She continues to soak in Betadine and water.  Objective: Vital signs are stable she is alert and oriented x3. There is no erythema edema saline is drainage or odor. Some fibrin deposition is present.  Assessment well-healing surgical toe.  Plan: Discontinue Betadine start with Epsom salts warm water soaks on a twice a day basis continue Cortisporin Otic solution watch for signs and symptoms of infection cover during the day and leave open at night call me if necessary.

## 2013-08-29 ENCOUNTER — Other Ambulatory Visit: Payer: Self-pay | Admitting: Internal Medicine

## 2013-08-30 NOTE — Telephone Encounter (Signed)
fioricet called to pharmacy

## 2013-08-31 ENCOUNTER — Other Ambulatory Visit: Payer: Self-pay | Admitting: Internal Medicine

## 2013-09-07 ENCOUNTER — Telehealth: Payer: Self-pay | Admitting: Internal Medicine

## 2013-09-07 NOTE — Telephone Encounter (Signed)
Recd records from [facility], Forwarding 5pgs to Dr.Norins

## 2013-10-04 ENCOUNTER — Other Ambulatory Visit (INDEPENDENT_AMBULATORY_CARE_PROVIDER_SITE_OTHER): Payer: BC Managed Care – PPO

## 2013-10-04 ENCOUNTER — Encounter: Payer: Self-pay | Admitting: Internal Medicine

## 2013-10-04 ENCOUNTER — Ambulatory Visit (INDEPENDENT_AMBULATORY_CARE_PROVIDER_SITE_OTHER): Payer: BC Managed Care – PPO | Admitting: Internal Medicine

## 2013-10-04 VITALS — BP 130/90 | HR 108 | Temp 97.8°F | Wt 165.4 lb

## 2013-10-04 DIAGNOSIS — IMO0001 Reserved for inherently not codable concepts without codable children: Secondary | ICD-10-CM

## 2013-10-04 DIAGNOSIS — M1711 Unilateral primary osteoarthritis, right knee: Secondary | ICD-10-CM

## 2013-10-04 DIAGNOSIS — E785 Hyperlipidemia, unspecified: Secondary | ICD-10-CM

## 2013-10-04 DIAGNOSIS — K219 Gastro-esophageal reflux disease without esophagitis: Secondary | ICD-10-CM

## 2013-10-04 DIAGNOSIS — J309 Allergic rhinitis, unspecified: Secondary | ICD-10-CM

## 2013-10-04 DIAGNOSIS — Z0000 Encounter for general adult medical examination without abnormal findings: Secondary | ICD-10-CM

## 2013-10-04 DIAGNOSIS — I10 Essential (primary) hypertension: Secondary | ICD-10-CM

## 2013-10-04 LAB — LIPID PANEL
Cholesterol: 225 mg/dL — ABNORMAL HIGH (ref 0–200)
VLDL: 69.2 mg/dL — ABNORMAL HIGH (ref 0.0–40.0)

## 2013-10-04 MED ORDER — BUTALBITAL-APAP-CAFFEINE 50-325-40 MG PO TABS: 1.0000 | ORAL_TABLET | Freq: Two times a day (BID) | ORAL | Status: DC | PRN

## 2013-10-04 MED ORDER — LISINOPRIL-HYDROCHLOROTHIAZIDE 10-12.5 MG PO TABS: 1.0000 | ORAL_TABLET | Freq: Every day | ORAL | Status: DC

## 2013-10-04 MED ORDER — ACETAMINOPHEN-CODEINE #3 300-30 MG PO TABS: 1.0000 | ORAL_TABLET | ORAL | Status: DC | PRN

## 2013-10-04 MED ORDER — GLIMEPIRIDE 2 MG PO TABS: 2.0000 mg | ORAL_TABLET | Freq: Every day | ORAL | Status: DC

## 2013-10-04 MED ORDER — CYCLOBENZAPRINE HCL 10 MG PO TABS: 20.0000 mg | ORAL_TABLET | Freq: Three times a day (TID) | ORAL | Status: DC | PRN

## 2013-10-04 MED ORDER — MONTELUKAST SODIUM 10 MG PO TABS: 10.0000 mg | ORAL_TABLET | Freq: Every day | ORAL | Status: DC

## 2013-10-04 MED ORDER — ALBUTEROL SULFATE HFA 108 (90 BASE) MCG/ACT IN AERS: 2.0000 | INHALATION_SPRAY | Freq: Four times a day (QID) | RESPIRATORY_TRACT | Status: DC | PRN

## 2013-10-04 MED ORDER — LOVASTATIN 40 MG PO TABS: 40.0000 mg | ORAL_TABLET | Freq: Every day | ORAL | Status: DC

## 2013-10-04 MED ORDER — CETIRIZINE-PSEUDOEPHEDRINE ER 5-120 MG PO TB12: 1.0000 | ORAL_TABLET | Freq: Every day | ORAL | Status: DC | PRN

## 2013-10-04 NOTE — Progress Notes (Signed)
Pre visit review using our clinic review tool, if applicable. No additional management support is needed unless otherwise documented below in the visit note. 

## 2013-10-04 NOTE — Patient Instructions (Signed)
Thanks for coming in to see [place].  Your exam is fine. Reviewed your labs: chemistry and liver functions in July '14 are fine; A1C record is fine. Will get cholesterol panel today with results to be posted to MyChart.  Immunizations are up to date. You will need a pelvic with PAP next year. Breast exam was ok - soft cystic structure noted at the 10:00 position right breast.  Take the Codeine for pain as needed, but judiciously.  Have a Happy and Healthy New Year.

## 2013-10-05 NOTE — Assessment & Plan Note (Signed)
Taking and tolerating "statin" therapy. LDL is better than goal of 130 or less.  Plan Continue present medications

## 2013-10-05 NOTE — Assessment & Plan Note (Signed)
Chronic, stable. No evidence of infection at this visit.

## 2013-10-05 NOTE — Assessment & Plan Note (Signed)
Well healed surgical scar right knee. Good ROM, no effusion.

## 2013-10-05 NOTE — Assessment & Plan Note (Signed)
BP Readings from Last 3 Encounters:  10/04/13 130/90  08/09/13 138/77  08/01/13 130/86   Good control on present medications. Chart reviewed - current with lab - normal BUN, Cr  Plan Continue present meds

## 2013-10-05 NOTE — Assessment & Plan Note (Signed)
Interval h/x - TKR otherwise holding her own. Physical exam is normal. Labs from record and lipid panel today - all good. She is current with colorectal and breast cancer screening. Immunizations are current.  In summary A nice woman with mulitple medical problems who appears medically stable at this time.

## 2013-10-05 NOTE — Progress Notes (Signed)
Subjective:    Patient ID: [name], female    DOB: 07-05-1966, 47 y.o.   MRN: [id number]  HPI [name] presents for general wellness exam. She has been doing OK: her usual sinus flares, she has had right TKR and is making a good recovery - she will still have some swelling with exercise and some discomfort but overall is doing much better. She is current with her dentist, current with eyecare. Chart reviewed and she will be due for Pelvic/PAP in 2015. She is current with mammography.  Past Medical History  Diagnosis Date  . ALLERGIC RHINITIS 10/08/2007  . ASTHMA 04/22/2007  . GERD 04/22/2007  . Headache(784.0) 10/08/2007  . HYPERTENSION 04/22/2007  . HYPERLIPIDEMIA 04/22/2007  . HYPERTENSION 04/22/2007  . LOW BACK PAIN 06/27/2008  . LUQ PAIN 09/06/2010  . NAUSEA 09/06/2010  . IBS (irritable bowel syndrome)     constipation predominant  . PONV (postoperative nausea and vomiting)   . Diabetes mellitus without complication   . Arthritis    Past Surgical History  Procedure Laterality Date  . Knee surgery  92, 96, 01, 08    right  . Knee surgery  88    left  . Nasal polyp surgery  95    sinus  . Lumbar fusion  05    L4-5 S1  . Tubal ligation    . Cesarean section    . Knee arthroplasty Right 04/29/2013    Procedure: COMPUTER ASSISTED TOTAL KNEE ARTHROPLASTY;  Surgeon: Eldred Manges, MD;  Location: [facility] OR;  Service: Orthopedics;  Laterality: Right;  Right Total Knee Arthroplasty, Cemented   Family History  Problem Relation Age of Onset  . Hypertension Mother   . Arthritis Mother     OA s/p bilateral hip replacement  . Diabetes Father   . Heart disease Father     CAD  . Hyperlipidemia Father   . Hypertension Father   . Pulmonary fibrosis Father   . Osteopenia Sister     older with GI problems s/p colectomy  . Cancer Maternal Grandmother     Ovarian Cancer  . Osteopenia Sister    History   Social History  . Marital Status: Married    Spouse Name: N/A    Number of  Children: 2  . Years of Education: 16   Occupational History  . substitute teacher [employer]   Social History Main Topics  . Smoking status: Never Smoker   . Smokeless tobacco: Never Used  . Alcohol Use: No  . Drug Use: No  . Sexual Activity: Yes    Partners: Male   Other Topics Concern  . Not on file   Social History Narrative   [employer]. Married 96'. 1 son '98, 1 daughter '08. 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 SO in good health. Work - Photographer - data and Naval architect. Daily Caffeine Use    Current Outpatient Prescriptions on File Prior to Visit  Medication Sig Dispense Refill  . fluocinonide cream (LIDEX) 0.05 % Apply to affected area  twice daily  60 g  1  . FLUoxetine (PROZAC) 20 MG tablet Take 1 tablet (20 mg total) by mouth daily.  30 tablet  3  . mineral oil liquid Take 30 mLs by mouth daily as needed for constipation.      . Multiple Vitamin (MULTIVITAMIN) tablet Take 1  tablet by mouth daily.       Marland Kitchen omeprazole-sodium bicarbonate (ZEGERID) 40-1100 MG per capsule Take 1 capsule by mouth daily.        No current facility-administered medications on file prior to visit.      Review of Systems Constitutional:  Negative for fever, chills, activity change and unexpected weight change.  HEENT:  Negative for hearing loss, ear pain, congestion, neck stiffness and postnasal drip. Negative for sore throat or swallowing problems. Negative for dental complaints.   Eyes: Negative for vision loss or change in visual acuity.  Respiratory: Negative for chest tightness and wheezing. Negative for DOE.   Cardiovascular: Negative for chest pain or palpitations. No decreased exercise tolerance Gastrointestinal: No change in bowel habit. No bloating or gas. No reflux or indigestion Genitourinary: Negative for urgency, frequency, flank pain and difficulty urinating.    Musculoskeletal: Negative for myalgias, back pain, arthralgias and gait problem.  Neurological: Negative for dizziness, tremors, weakness and headaches.  Hematological: Negative for adenopathy.  Psychiatric/Behavioral: Negative for behavioral problems and dysphoric mood.        Objective:   Physical Exam Filed Vitals:   10/04/13 1007  BP: 130/90  Pulse: 108  Temp: 97.8 F (36.6 C)   Wt Readings from Last 3 Encounters:  10/04/13 165 lb 6.4 oz (75.025 kg)  08/09/13 156 lb (70.761 kg)  08/01/13 158 lb 12.8 oz (72.031 kg)   BP Readings from Last 3 Encounters:  10/04/13 130/90  08/09/13 138/77  08/01/13 130/86   Gen'l- WNWD woman in no distress HEENT- [place]/AT, oropharynx with good dentition no lesions, C&S clear, EAC/TMs normal Neck - supple , no thyromegaly Nodes - negative cervical, supraclavicular and axillary Cor 2+ radial and DP pulses, quiet precordium, no JVD, no carotid bruits, RRR Pulm - normal respirations, Lungs CTAP Breast exam - skin normal, nipples w/o discharge, soft, mobile 2.5 cm mass 10:00 right breast otherwise clear exam Abdomen - BS+ x 4, no guarding or rebound, no HSM Pelvic/rectal - deferred to exam of '13 Neuro - Non focal exam MSK - no c/c/e, no deformity  Recent Results (from the past 2160 hour(s))  LIPID PANEL     Status: Abnormal   Collection Time    10/04/13 10:45 AM      Result Value Range   Cholesterol 225 (*) 0 - 200 mg/dL   Comment: ATP III Classification       Desirable:  < 200 mg/dL               Borderline High:  200 - 239 mg/dL          High:  > = 161 mg/dL   Triglycerides 096.0 (*) 0.0 - 149.0 mg/dL   Comment: Normal:  <454 mg/dLBorderline High:  150 - 199 mg/dL   HDL 09.81  >19.14 mg/dL   VLDL 78.2 (*) 0.0 - 95.6 mg/dL   Total CHOL/HDL Ratio 4     Comment:                Men          Women1/2 Average Risk     3.4          3.3Average Risk          5.0          4.42X Average Risk          9.6          7.13X Average Risk  15.0           11.0                      LDL CHOLESTEROL, DIRECT     Status: None   Collection Time    10/04/13 10:45 AM      Result Value Range   Direct LDL 125.5     Comment: Optimal:  <100 mg/dLNear or Above Optimal:  100-129 mg/dLBorderline High:  130-159 mg/dLHigh:  160-189 mg/dLVery High:  >190 mg/dL          Assessment & Plan:

## 2013-10-05 NOTE — Assessment & Plan Note (Signed)
She is doing A1C at home through [facility]. Results scanned into EPIC. Last A1C less than 7%  Plan Continue present regimen

## 2013-10-05 NOTE — Assessment & Plan Note (Signed)
Good control. No active c/o.

## 2013-11-19 ENCOUNTER — Telehealth: Payer: Self-pay | Admitting: *Deleted

## 2013-11-19 ENCOUNTER — Other Ambulatory Visit: Payer: Self-pay | Admitting: Nurse Practitioner

## 2013-11-19 ENCOUNTER — Ambulatory Visit (INDEPENDENT_AMBULATORY_CARE_PROVIDER_SITE_OTHER): Payer: BC Managed Care – PPO | Admitting: Nurse Practitioner

## 2013-11-19 ENCOUNTER — Encounter: Payer: Self-pay | Admitting: Nurse Practitioner

## 2013-11-19 VITALS — BP 148/86 | HR 99 | Temp 98.0°F | Resp 16 | Wt 166.0 lb

## 2013-11-19 DIAGNOSIS — J45901 Unspecified asthma with (acute) exacerbation: Secondary | ICD-10-CM

## 2013-11-19 DIAGNOSIS — J989 Respiratory disorder, unspecified: Secondary | ICD-10-CM

## 2013-11-19 MED ORDER — DOXYCYCLINE HYCLATE 100 MG PO TABS: 100.0000 mg | ORAL_TABLET | Freq: Two times a day (BID) | ORAL | Status: DC

## 2013-11-19 MED ORDER — PREDNISONE 10 MG PO TABS: ORAL_TABLET | ORAL | Status: DC

## 2013-11-19 MED ORDER — ALBUTEROL SULFATE (2.5 MG/3ML) 0.083% IN NEBU: 2.5000 mg | INHALATION_SOLUTION | Freq: Four times a day (QID) | RESPIRATORY_TRACT | Status: DC | PRN

## 2013-11-19 MED ORDER — LEVALBUTEROL HCL 0.63 MG/3ML IN NEBU: 0.6300 mg | INHALATION_SOLUTION | Freq: Once | RESPIRATORY_TRACT | Status: DC

## 2013-11-19 NOTE — Progress Notes (Signed)
Pre visit review using our clinic review tool, if applicable. No additional management support is needed unless otherwise documented below in the visit note. 

## 2013-11-19 NOTE — Telephone Encounter (Signed)
[facility] pharmacist called and wanted to see if we could change patient's  levalbuterol (XOPENEX) 0.63 MG/3ML nebulizer solution to plain albuterol due to the cost.

## 2013-11-19 NOTE — Patient Instructions (Addendum)
Use neb once daily, twice if needed. Sinus rinses daily. Start prednisone, if no relief after 2 days Start antibiotic. Take probiotic in middle of day. Feel better.

## 2013-11-19 NOTE — Progress Notes (Signed)
   Subjective:    Patient ID: [name], female    DOB: 1965/12/16, 48 y.o.   MRN: [id number]  Cough This is a new problem. The current episode started 1 to 4 weeks ago (1w). The problem has been gradually worsening. The cough is non-productive. Associated symptoms include chest pain (feels tight, no pain), ear congestion, headaches, nasal congestion, postnasal drip and shortness of breath. Pertinent negatives include no chills, ear pain, fever (pt takes 6-8T ibuprophen qd for knee pain), sore throat, weight loss or wheezing. Associated symptoms comments: Neck pain. Nothing aggravates the symptoms. She has tried a beta-agonist inhaler (used 3 times yesterday, twice previous 2 days) for the symptoms. The treatment provided mild relief. Her past medical history is significant for asthma and environmental allergies.      Review of Systems  Constitutional: Negative for fever (pt takes 6-8T ibuprophen qd for knee pain), chills, weight loss and fatigue.  HENT: Positive for congestion (nasal) and postnasal drip. Negative for ear pain, sinus pressure, sore throat and voice change.   Respiratory: Positive for cough, chest tightness and shortness of breath. Negative for wheezing.   Cardiovascular: Positive for chest pain (feels tight, no pain).  Gastrointestinal: Negative for nausea, vomiting, abdominal pain and diarrhea.  Musculoskeletal: Negative for back pain.  Allergic/Immunologic: Positive for environmental allergies.  Neurological: Positive for headaches.       Objective:   Physical Exam  Vitals reviewed. Constitutional: She is oriented to person, place, and time. She appears well-developed and well-nourished. No distress.  HENT:  Head: Normocephalic and atraumatic.  Eyes: Conjunctivae are normal. Right eye exhibits no discharge. Left eye exhibits no discharge.  Neck: Normal range of motion. Neck supple. No thyromegaly present.  Cardiovascular: Normal rate, regular rhythm and normal  heart sounds.   Pulmonary/Chest: Effort normal and breath sounds normal. No respiratory distress. She has no wheezes. She has no rales.  Musculoskeletal: Normal range of motion.  R shoulder higher than L, kyphosis. Neg kernig & brudzinski  Lymphadenopathy:    She has no cervical adenopathy.  Neurological: She is alert and oriented to person, place, and time.  Skin: Skin is warm and dry.  Psychiatric: She has a normal mood and affect. Her behavior is normal. Thought content normal.          Assessment & Plan:  1. Respiratory illness Nasal congestion, cough, chest tightness DD: viral w/ asthma flare Pt adamant that "never just gets a cold" Always gets "chest or sinus infection". Adv wait on ABX for 2 days. Take if neb & pred do not improve symptoms.  - doxycycline (VIBRA-TABS) 100 MG tablet; Take 1 tablet (100 mg total) by mouth 2 (two) times daily.  Dispense: 10 tablet; Refill: 0  2. Asthma with acute exacerbation - levalbuterol (XOPENEX) 0.63 MG/3ML nebulizer solution; Take 3 mLs (0.63 mg total) by nebulization once.  Dispense: 3 mL; Refill: 12 - predniSONE (DELTASONE) 10 MG tablet; Starting 11/17/13, Take 4Tpo qam X 3d, then 3T po qam X 3d, then 2T po qd X 3d, then 1T po qam X 3d.  Dispense: 30 tablet; Refill: 0

## 2013-11-27 ENCOUNTER — Other Ambulatory Visit: Payer: Self-pay | Admitting: Internal Medicine

## 2013-11-30 ENCOUNTER — Encounter: Payer: Self-pay | Admitting: Internal Medicine

## 2013-11-30 DIAGNOSIS — E785 Hyperlipidemia, unspecified: Secondary | ICD-10-CM

## 2013-12-01 ENCOUNTER — Other Ambulatory Visit: Payer: Self-pay | Admitting: Internal Medicine

## 2013-12-02 ENCOUNTER — Encounter: Payer: Self-pay | Admitting: Internal Medicine

## 2013-12-02 MED ORDER — ATORVASTATIN CALCIUM 40 MG PO TABS: 40.0000 mg | ORAL_TABLET | Freq: Every day | ORAL | Status: DC

## 2013-12-05 MED ORDER — NABUMETONE 750 MG PO TABS: 1500.0000 mg | ORAL_TABLET | Freq: Every day | ORAL | Status: DC

## 2013-12-15 ENCOUNTER — Other Ambulatory Visit: Payer: Self-pay | Admitting: Internal Medicine

## 2014-02-20 ENCOUNTER — Encounter: Payer: Self-pay | Admitting: Physician Assistant

## 2014-02-20 ENCOUNTER — Ambulatory Visit (INDEPENDENT_AMBULATORY_CARE_PROVIDER_SITE_OTHER): Payer: BC Managed Care – PPO | Admitting: Physician Assistant

## 2014-02-20 VITALS — BP 124/80 | HR 96 | Temp 98.4°F | Resp 18 | Wt 171.0 lb

## 2014-02-20 DIAGNOSIS — J019 Acute sinusitis, unspecified: Secondary | ICD-10-CM

## 2014-02-20 DIAGNOSIS — J45901 Unspecified asthma with (acute) exacerbation: Secondary | ICD-10-CM

## 2014-02-20 MED ORDER — METHYLPREDNISOLONE ACETATE 80 MG/ML IJ SUSP: 80.0000 mg | Freq: Once | INTRAMUSCULAR | Status: DC

## 2014-02-20 MED ORDER — METHYLPREDNISOLONE ACETATE 80 MG/ML IJ SUSP
80.0000 mg | Freq: Once | INTRAMUSCULAR | Status: AC
  Administered 2014-02-20: 80 mg via INTRAMUSCULAR

## 2014-02-20 MED ORDER — DOXYCYCLINE HYCLATE 100 MG PO TABS: 100.0000 mg | ORAL_TABLET | Freq: Two times a day (BID) | ORAL | Status: DC

## 2014-02-20 NOTE — Progress Notes (Signed)
Subjective:    Patient ID: [name], female    DOB: 08/17/1966, 48 y.o.   MRN: [id number]  Cough This is a new problem. The current episode started in the past 7 days (Pt was in hospital all last week with her son.). The problem has been gradually worsening. The problem occurs every few minutes. The cough is productive of sputum. Associated symptoms include ear congestion, a fever, headaches, myalgias, nasal congestion, postnasal drip, a sore throat and shortness of breath (with wheezing.). Pertinent negatives include no chest pain, chills, ear pain, heartburn, hemoptysis, rash, rhinorrhea, sweats, weight loss or wheezing. Nothing aggravates the symptoms. Treatments tried: Pt on daily Singulair and Zyrtec. The treatment provided mild relief. Her past medical history is significant for asthma and environmental allergies. There is no history of COPD.    Review of Systems  Constitutional: Positive for fever. Negative for chills and weight loss.  HENT: Positive for postnasal drip and sore throat. Negative for ear pain and rhinorrhea.   Respiratory: Positive for cough and shortness of breath (with wheezing.). Negative for hemoptysis and wheezing.   Cardiovascular: Negative for chest pain.  Gastrointestinal: Negative for heartburn, nausea, vomiting and diarrhea.  Musculoskeletal: Positive for myalgias.  Skin: Negative for rash.  Allergic/Immunologic: Positive for environmental allergies.  Neurological: Positive for headaches.  All other systems reviewed and are negative.   Past Medical History  Diagnosis Date  . ALLERGIC RHINITIS 10/08/2007  . ASTHMA 04/22/2007  . GERD 04/22/2007  . Headache(784.0) 10/08/2007  . HYPERTENSION 04/22/2007  . HYPERLIPIDEMIA 04/22/2007  . HYPERTENSION 04/22/2007  . LOW BACK PAIN 06/27/2008  . LUQ PAIN 09/06/2010  . NAUSEA 09/06/2010  . IBS (irritable bowel syndrome)     constipation predominant  . PONV (postoperative nausea and vomiting)   . Diabetes mellitus  without complication   . Arthritis    Past Surgical History  Procedure Laterality Date  . Knee surgery  92, 96, 01, 08    right  . Knee surgery  88    left  . Nasal polyp surgery  95    sinus  . Lumbar fusion  05    L4-5 S1  . Tubal ligation    . Cesarean section    . Knee arthroplasty Right 04/29/2013    Procedure: COMPUTER ASSISTED TOTAL KNEE ARTHROPLASTY;  Surgeon: Eldred MangesMark C Yates, MD;  Location: [facility] OR;  Service: Orthopedics;  Laterality: Right;  Right Total Knee Arthroplasty, Cemented    reports that she has never smoked. She has never used smokeless tobacco. She reports that she does not drink alcohol or use illicit drugs. family history includes Arthritis in her mother; Cancer in her maternal grandmother; Diabetes in her father; Heart disease in her father; Hyperlipidemia in her father; Hypertension in her father and mother; Osteopenia in her sister and sister; Pulmonary fibrosis in her father. Allergies  Allergen Reactions  . Penicillins     Severe reaction - hospitlaized       Objective:   Physical Exam  Nursing note and vitals reviewed. Constitutional: She is oriented to person, place, and time. She appears well-developed and well-nourished. No distress.  HENT:  Head: Normocephalic and atraumatic.  Right Ear: External ear normal.  Left Ear: External ear normal.  Nose: Nose normal.  Mouth/Throat: No oropharyngeal exudate.  Oropharynx slightly erythematous no exudate.  Bilateral tympanic membranes normal in appearance.  Right maxillary sinus and right frontal sinus exquisitely tender to palpation. Left frontal and maxillary sinus nontender to palpation.  Eyes: Conjunctivae and EOM are normal. Pupils are equal, round, and reactive to light.  Neck: Normal range of motion. Neck supple. No JVD present.  Cardiovascular: Normal rate, regular rhythm, normal heart sounds and intact distal pulses.  Exam reveals no gallop and no friction rub.   No murmur heard. Pulmonary/Chest:  Effort normal. No stridor. No respiratory distress. She has wheezes (mild wheezing). She has no rales. She exhibits no tenderness.  No rhonchi bilateral lung bases.  Musculoskeletal: Normal range of motion.  Lymphadenopathy:    She has no cervical adenopathy.  Neurological: She is alert and oriented to person, place, and time.  Skin: Skin is warm and dry. No rash noted. She is not diaphoretic. No erythema. No pallor.  Psychiatric: She has a normal mood and affect. Her behavior is normal. Judgment and thought content normal.    Filed Vitals:   02/20/14 1007  BP: 124/80  Pulse: 96  Temp: 98.4 F (36.9 C)  Resp: 18     Lab Results  Component Value Date   WBC 6.7 05/02/2013   HGB 8.5* 05/02/2013   HCT 26.8* 05/02/2013   PLT 198 05/02/2013   GLUCOSE 134* 04/30/2013   CHOL 225* 10/04/2013   TRIG 346.0* 10/04/2013   HDL 58.40 10/04/2013   LDLDIRECT 125.5 10/04/2013   LDLCALC 119* 11/17/2011   ALT 28 04/27/2013   AST 31 04/27/2013   NA 135 04/30/2013   K 4.0 04/30/2013   CL 100 04/30/2013   CREATININE 0.79 04/30/2013   BUN 11 04/30/2013   CO2 28 04/30/2013   TSH 1.86 08/30/2010   INR 0.98 04/27/2013   HGBA1C 6.5* 01/07/2008      Assessment & Plan:  [name] was seen today for cough.  Diagnoses and associated orders for this visit:  Acute sinusitis - doxycycline (VIBRA-TABS) 100 MG tablet; Take 1 tablet (100 mg total) by mouth 2 (two) times daily.  Asthma with acute exacerbation - methylPREDNISolone acetate (DEPO-MEDROL) 80 MG/ML injection; Inject 1 mL (80 mg total) into the muscle once. - Mild exacerbation, symptoms manageable by asthma medication at home.   Plan to followup in one week to reassess symptoms.  Patient Instructions  Doxycycline twice a day for 10 days for sinus infection.  Continue taking your daily allergy medicines as prescribed.  Plain Over the Counter Mucinex (NOT Mucinex D) for thick secretions  Force NON dairy fluids, drinking plenty of water is best.     Over the Counter Flonase OR Nasacort AQ 1 spray in each nostril twice a day as needed. Use the "crossover" technique into opposite nostril spraying toward opposite ear @ 45 degree angle, not straight up into nostril.   Saline Irrigation and Saline Sprays can also help reduce symptoms.  Followup in one week to reassess symptoms or sooner if symptoms fail to improve or worsen despite treatment.

## 2014-02-20 NOTE — Patient Instructions (Signed)
Doxycycline twice a day for 10 days for sinus infection.  Continue taking your daily allergy medicines as prescribed.  Plain Over the Counter Mucinex (NOT Mucinex D) for thick secretions  Force NON dairy fluids, drinking plenty of water is best.    Over the Counter Flonase OR Nasacort AQ 1 spray in each nostril twice a day as needed. Use the "crossover" technique into opposite nostril spraying toward opposite ear @ 45 degree angle, not straight up into nostril.   Saline Irrigation and Saline Sprays can also help reduce symptoms.  Followup in one week to reassess symptoms or sooner if symptoms fail to improve or worsen despite treatment.  Sinusitis Sinusitis is redness, soreness, and puffiness (inflammation) of the air pockets in the bones of your face (sinuses). The redness, soreness, and puffiness can cause air and mucus to get trapped in your sinuses. This can allow germs to grow and cause an infection.  HOME CARE   Drink enough fluids to keep your pee (urine) clear or pale yellow.  Use a humidifier in your home.  Run a hot shower to create steam in the bathroom. Sit in the bathroom with the door closed. Breathe in the steam 3 4 times a day.  Put a warm, moist washcloth on your face 3 4 times a day, or as told by your doctor.  Use salt water sprays (saline sprays) to wet the thick fluid in your nose. This can help the sinuses drain.  Only take medicine as told by your doctor. GET HELP RIGHT AWAY IF:   Your pain gets worse.  You have very bad headaches.  You are sick to your stomach (nauseous).  You throw up (vomit).  You are very sleepy (drowsy) all the time.  Your face is puffy (swollen).  Your vision changes.  You have a stiff neck.  You have trouble breathing. MAKE SURE YOU:   Understand these instructions.  Will watch your condition.  Will get help right away if you are not doing well or get worse. Document Released: 03/10/2008 Document Revised: 06/16/2012  Document Reviewed: 04/27/2012 [facility] Patient Information 2014 [place], [place].

## 2014-02-20 NOTE — Addendum Note (Signed)
Addended by: Azucena FreedMILLNER, Kjersten Ormiston C on: 02/20/2014 11:35 AM   Modules accepted: Orders

## 2014-03-07 ENCOUNTER — Telehealth: Payer: Self-pay | Admitting: Physician Assistant

## 2014-03-07 MED ORDER — FLUOXETINE HCL 20 MG PO CAPS: ORAL_CAPSULE | ORAL | Status: DC

## 2014-03-07 NOTE — Telephone Encounter (Signed)
[facility] - [place], [place] - [address] is requesting re-fill on FLUoxetine (PROZAC) 20 MG capsule

## 2014-03-07 NOTE — Telephone Encounter (Signed)
Ok to refill 

## 2014-03-07 NOTE — Telephone Encounter (Signed)
pls advise

## 2014-03-07 NOTE — Telephone Encounter (Signed)
Rx sent to pharmacy   

## 2014-03-09 ENCOUNTER — Encounter: Payer: Self-pay | Admitting: Physician Assistant

## 2014-03-09 ENCOUNTER — Ambulatory Visit (INDEPENDENT_AMBULATORY_CARE_PROVIDER_SITE_OTHER): Payer: BC Managed Care – PPO | Admitting: Physician Assistant

## 2014-03-09 VITALS — BP 144/78 | HR 78 | Temp 98.0°F | Resp 18 | Wt 171.0 lb

## 2014-03-09 DIAGNOSIS — J45901 Unspecified asthma with (acute) exacerbation: Secondary | ICD-10-CM

## 2014-03-09 MED ORDER — FLUTICASONE FUROATE-VILANTEROL 100-25 MCG/INH IN AEPB: INHALATION_SPRAY | RESPIRATORY_TRACT | Status: DC

## 2014-03-09 NOTE — Progress Notes (Signed)
Pre visit review using our clinic review tool, if applicable. No additional management support is needed unless otherwise documented below in the visit note. 

## 2014-03-09 NOTE — Progress Notes (Signed)
Subjective:    Patient ID: [name], female    DOB: [birth year], 48 y.o.   MRN: [id number]  Asthma She complains of chest tightness, cough, shortness of breath and wheezing. There is no sputum production. This is a chronic problem. The current episode started 1 to 4 weeks ago. The problem occurs daily (at night/ when lying down.). The problem has been unchanged. The cough is non-productive. Associated symptoms include postnasal drip. Pertinent negatives include no chest pain, ear congestion, ear pain, fever, nasal congestion or sore throat. Her symptoms are aggravated by lying down (dry air in hospital.). Her symptoms are alleviated by beta-agonist. She reports minimal improvement on treatment. Her past medical history is significant for asthma. There is no history of COPD.      Review of Systems  Constitutional: Negative for fever and chills.  HENT: Positive for postnasal drip. Negative for ear pain, sinus pressure and sore throat.   Respiratory: Positive for cough, shortness of breath and wheezing. Negative for sputum production.   Cardiovascular: Negative for chest pain.  Gastrointestinal: Negative for nausea, vomiting and diarrhea.  All other systems reviewed and are negative.    Past Medical History  Diagnosis Date  . ALLERGIC RHINITIS 10/08/2007  . ASTHMA 04/22/2007  . GERD 04/22/2007  . Headache(784.0) 10/08/2007  . HYPERTENSION 04/22/2007  . HYPERLIPIDEMIA 04/22/2007  . HYPERTENSION 04/22/2007  . LOW BACK PAIN 06/27/2008  . LUQ PAIN 09/06/2010  . NAUSEA 09/06/2010  . IBS (irritable bowel syndrome)     constipation predominant  . PONV (postoperative nausea and vomiting)   . Diabetes mellitus without complication   . Arthritis    Past Surgical History  Procedure Laterality Date  . Knee surgery  92, 96, 01, 08    right  . Knee surgery  88    left  . Nasal polyp surgery  95    sinus  . Lumbar fusion  05    L4-5 S1  . Tubal ligation    . Cesarean section    . Knee  arthroplasty Right 04/29/2013    Procedure: COMPUTER ASSISTED TOTAL KNEE ARTHROPLASTY;  Surgeon: Eldred MangesMark C Yates, MD;  Location: [facility] OR;  Service: Orthopedics;  Laterality: Right;  Right Total Knee Arthroplasty, Cemented    reports that she has never smoked. She has never used smokeless tobacco. She reports that she does not drink alcohol or use illicit drugs. family history includes Arthritis in her mother; Cancer in her maternal grandmother; Diabetes in her father; Heart disease in her father; Hyperlipidemia in her father; Hypertension in her father and mother; Osteopenia in her sister and sister; Pulmonary fibrosis in her father. Allergies  Allergen Reactions  . Penicillins     Severe reaction - hospitlaized       Objective:   Physical Exam  Nursing note and vitals reviewed. Constitutional: She is oriented to person, place, and time. She appears well-developed and well-nourished. No distress.  HENT:  Head: Normocephalic and atraumatic.  Right Ear: External ear normal.  Left Ear: External ear normal.  Nose: Nose normal.  Mouth/Throat: No oropharyngeal exudate.  Oropharynx slightly erythematous with cobblestoning, no exudate. Bilateral frontal and maxillary sinuses nontender to palpation.  Eyes: Conjunctivae and EOM are normal. Pupils are equal, round, and reactive to light.  Neck: Normal range of motion. Neck supple. No JVD present.  Cardiovascular: Normal rate, regular rhythm, normal heart sounds and intact distal pulses.  Exam reveals no gallop and no friction rub.   No murmur heard.  Pulmonary/Chest: Effort normal and breath sounds normal. No stridor. No respiratory distress. She has no wheezes. She has no rales. She exhibits no tenderness.  Normal lung sounds at visit.  Musculoskeletal: Normal range of motion.  Lymphadenopathy:    She has no cervical adenopathy.  Neurological: She is alert and oriented to person, place, and time.  Skin: Skin is warm and dry. No rash noted. She is  not diaphoretic. No erythema. No pallor.  Psychiatric: She has a normal mood and affect. Her behavior is normal. Judgment and thought content normal.   Filed Vitals:   03/09/14 1309  BP: 144/78  Pulse: 78  Temp: 98 F (36.7 C)  Resp: 18    Lab Results  Component Value Date   WBC 6.7 05/02/2013   HGB 8.5* 05/02/2013   HCT 26.8* 05/02/2013   PLT 198 05/02/2013   GLUCOSE 134* 04/30/2013   CHOL 225* 10/04/2013   TRIG 346.0* 10/04/2013   HDL 58.40 10/04/2013   LDLDIRECT 125.5 10/04/2013   LDLCALC 119* 11/17/2011   ALT 28 04/27/2013   AST 31 04/27/2013   NA 135 04/30/2013   K 4.0 04/30/2013   CL 100 04/30/2013   CREATININE 0.79 04/30/2013   BUN 11 04/30/2013   CO2 28 04/30/2013   TSH 1.86 08/30/2010   INR 0.98 04/27/2013   HGBA1C 6.5* 01/07/2008         Assessment & Plan:  [name] was seen today for follow-up.  Diagnoses and associated orders for this visit:  Asthma with acute exacerbation Comments: Improving. More frequent night symptoms following URI. Trial of Breo Ellipta (sample). If curative, but symptoms persist without med, will consider full Rx. - Fluticasone Furoate-Vilanterol (BREO ELLIPTA) 100-25 MCG/INH AEPB; 128mcg/25mcg. 1 inhalation once daily. Swish and spit after dose to avoid thrush.   Trial of LABA and inhaled steroid to improve nighttime asthma symptoms following recent URI.   Also some mild allergic changes on exam, will encourage OTC mucinex, nasal steroid, and antihistamine.  Plan is to follow up as needed, or for persistent or worsening symptoms despite treatment.  Patient Instructions  Trial of Breo Ellipta, one inhalation once daily. Remember to swish and spit and brush teeth after dose to avoid thrush.  Continue over the counter therapy below:  Plain Over the Counter Mucinex (NOT Mucinex D) for thick secretions  Force NON dairy fluids, drinking plenty of water is best.    Over the Counter Flonase OR Nasacort AQ 1 spray in each nostril twice a day as  needed. Use the "crossover" technique into opposite nostril spraying toward opposite ear @ 45 degree angle, not straight up into nostril.   Plain Over the Counter Allegra (NOT D )  160 daily , OR Loratidine 10 mg , OR Zyrtec 10 mg @ bedtime  as needed for itchy eyes & sneezing.  Saline Irrigation and Saline Sprays can also help reduce symptoms.  Follow up as needed, or for worsening or persistent symptoms despite treatment.

## 2014-03-09 NOTE — Patient Instructions (Signed)
Trial of Breo Ellipta, one inhalation once daily. Remember to swish and spit and brush teeth after dose to avoid thrush.  Continue over the counter therapy below:  Plain Over the Counter Mucinex (NOT Mucinex D) for thick secretions  Force NON dairy fluids, drinking plenty of water is best.    Over the Counter Flonase OR Nasacort AQ 1 spray in each nostril twice a day as needed. Use the "crossover" technique into opposite nostril spraying toward opposite ear @ 45 degree angle, not straight up into nostril.   Plain Over the Counter Allegra (NOT D )  160 daily , OR Loratidine 10 mg , OR Zyrtec 10 mg @ bedtime  as needed for itchy eyes & sneezing.  Saline Irrigation and Saline Sprays can also help reduce symptoms.  Follow up as needed, or for worsening or persistent symptoms despite treatment.     Asthma Attack Prevention Although there is no way to prevent asthma from starting, you can take steps to control the disease and reduce its symptoms. Learn about your asthma and how to control it. Take an active role to control your asthma by working with your health care provider to create and follow an asthma action plan. An asthma action plan guides you in:  Taking your medicines properly.  Avoiding things that set off your asthma or make your asthma worse (asthma triggers).  Tracking your level of asthma control.  Responding to worsening asthma.  Seeking emergency care when needed. To track your asthma, keep records of your symptoms, check your peak flow number using a handheld device that shows how well air moves out of your lungs (peak flow meter), and get regular asthma checkups.  WHAT ARE SOME WAYS TO PREVENT AN ASTHMA ATTACK?  Take medicines as directed by your health care provider.  Keep track of your asthma symptoms and level of control.  With your health care provider, write a detailed plan for taking medicines and managing an asthma attack. Then be sure to follow your action  plan. Asthma is an ongoing condition that needs regular monitoring and treatment.  Identify and avoid asthma triggers. Many outdoor allergens and irritants (such as pollen, mold, cold air, and air pollution) can trigger asthma attacks. Find out what your asthma triggers are and take steps to avoid them.  Monitor your breathing. Learn to recognize warning signs of an attack, such as coughing, wheezing, or shortness of breath. Your lung function may decrease before you notice any signs or symptoms, so regularly measure and record your peak airflow with a home peak flow meter.  Identify and treat attacks early. If you act quickly, you are less likely to have a severe attack. You will also need less medicine to control your symptoms. When your peak flow measurements decrease and alert you to an upcoming attack, take your medicine as instructed and immediately stop any activity that may have triggered the attack. If your symptoms do not improve, get medical help.  Pay attention to increasing quick-relief inhaler use. If you find yourself relying on your quick-relief inhaler, your asthma is not under control. See your health care provider about adjusting your treatment. WHAT CAN MAKE MY SYMPTOMS WORSE? A number of common things can set off or make your asthma symptoms worse and cause temporary increased inflammation of your airways. Keep track of your asthma symptoms for several weeks, detailing all the environmental and emotional factors that are linked with your asthma. When you have an asthma attack, go back to  your asthma diary to see which factor, or combination of factors, might have contributed to it. Once you know what these factors are, you can take steps to control many of them. If you have allergies and asthma, it is important to take asthma prevention steps at home. Minimizing contact with the substance to which you are allergic will help prevent an asthma attack. Some triggers and ways to avoid  these triggers are: Animal Dander:  Some people are allergic to the flakes of skin or dried saliva from animals with fur or feathers.   There is no such thing as a hypoallergenic dog or cat breed. All dogs or cats can cause allergies, even if they don't shed.  Keep these pets out of your home.  If you are not able to keep a pet outdoors, keep the pet out of your bedroom and other sleeping areas at all times, and keep the door closed.  Remove carpets and furniture covered with cloth from your home. If that is not possible, keep the pet away from fabric-covered furniture and carpets. Dust Mites: Many people with asthma are allergic to dust mites. Dust mites are tiny bugs that are found in every home in mattresses, pillows, carpets, fabric-covered furniture, bedcovers, clothes, stuffed toys, and other fabric-covered items.   Cover your mattress in a special dust-proof cover.  Cover your pillow in a special dust-proof cover, or wash the pillow each week in hot water. Water must be hotter than 130 F (54.4 C) to kill dust mites. Cold or warm water used with detergent and bleach can also be effective.  Wash the sheets and blankets on your bed each week in hot water.  Try not to sleep or lie on cloth-covered cushions.  Call ahead when traveling and ask for a smoke-free hotel room. Bring your own bedding and pillows in case the hotel only supplies feather pillows and down comforters, which may contain dust mites and cause asthma symptoms.  Remove carpets from your bedroom and those laid on concrete, if you can.  Keep stuffed toys out of the bed, or wash the toys weekly in hot water or cooler water with detergent and bleach. Cockroaches: Many people with asthma are allergic to the droppings and remains of cockroaches.   Keep food and garbage in closed containers. Never leave food out.  Use poison baits, traps, powders, gels, or paste (for example, boric acid).  If a spray is used to kill  cockroaches, stay out of the room until the odor goes away. Indoor Mold:  Fix leaky faucets, pipes, or other sources of water that have mold around them.  Clean floors and moldy surfaces with a fungicide or diluted bleach.  Avoid using humidifiers, vaporizers, or swamp coolers. These can spread molds through the air. Pollen and Outdoor Mold:  When pollen or mold spore counts are high, try to keep your windows closed.  Stay indoors with windows closed from late morning to afternoon. Pollen and some mold spore counts are highest at that time.  Ask your health care provider whether you need to take anti-inflammatory medicine or increase your dose of the medicine before your allergy season starts. Other Irritants to Avoid:  Tobacco smoke is an irritant. If you smoke, ask your health care provider how you can quit. Ask family members to quit smoking too. Do not allow smoking in your home or car.  If possible, do not use a wood-burning stove, kerosene heater, or fireplace. Minimize exposure to all sources of  smoke, including to incense, candles, fires, and fireworks.  Try to stay away from strong odors and sprays, such as perfume, talcum powder, hair spray, and paints.  Decrease humidity in your home and use an indoor air cleaning device. Reduce indoor humidity to below 60%. Dehumidifiers or central air conditioners can do this.  Decrease house dust exposure by changing furnace and air cooler filters frequently.  Try to have someone else vacuum for you once or twice a week. Stay out of rooms while they are being vacuumed and for a short while afterward.  If you vacuum, use a dust mask from a hardware store, a double-layered or microfilter vacuum cleaner bag, or a vacuum cleaner with a HEPA filter.  Sulfites in foods and beverages can be irritants. Do not drink beer or wine or eat dried fruit, processed potatoes, or shrimp if they cause asthma symptoms.  Cold air can trigger an asthma  attack. Cover your nose and mouth with a scarf on cold or windy days.  Several health conditions can make asthma more difficult to manage, including a runny nose, sinus infections, reflux disease, psychological stress, and sleep apnea. Work with your health care provider to manage these conditions.  Avoid close contact with people who have a respiratory infection such as a cold or the flu, since your asthma symptoms may get worse if you catch the infection. Wash your hands thoroughly after touching items that may have been handled by people with a respiratory infection.  Get a flu shot every year to protect against the flu virus, which often makes asthma worse for days or weeks. Also get a pneumonia shot if you have not previously had one. Unlike the flu shot, the pneumonia shot does not need to be given yearly. Medicines:  Talk to your health care provider about whether it is safe for you to take aspirin or non-steroidal anti-inflammatory medicines (NSAIDs). In a small number of people with asthma, aspirin and NSAIDs can cause asthma attacks. These medicines must be avoided by people who have known aspirin-sensitive asthma. It is important that people with aspirin-sensitive asthma read labels of all over-the-counter medicines used to treat pain, colds, coughs, and fever.  Beta blockers and ACE inhibitors are other medicines you should discuss with your health care provider. HOW CAN I FIND OUT WHAT I AM ALLERGIC TO? Ask your asthma health care provider about allergy skin testing or blood testing (the RAST test) to identify the allergens to which you are sensitive. If you are found to have allergies, the most important thing to do is to try to avoid exposure to any allergens that you are sensitive to as much as possible. Other treatments for allergies, such as medicines and allergy shots (immunotherapy) are available.  CAN I EXERCISE? Follow your health care provider's advice regarding asthma treatment  before exercising. It is important to maintain a regular exercise program, but vigorous exercise, or exercise in cold, humid, or dry environments can cause asthma attacks, especially for those people who have exercise-induced asthma. Document Released: 09/10/2009 Document Revised: 05/25/2013 Document Reviewed: 03/30/2013 [facility] Patient Information 2014 [place], [place].

## 2014-04-03 ENCOUNTER — Telehealth: Payer: Self-pay | Admitting: Physician Assistant

## 2014-04-03 MED ORDER — CYCLOBENZAPRINE HCL 10 MG PO TABS: 20.0000 mg | ORAL_TABLET | Freq: Three times a day (TID) | ORAL | Status: DC

## 2014-04-03 MED ORDER — DICYCLOMINE HCL 10 MG PO CAPS: 10.0000 mg | ORAL_CAPSULE | Freq: Two times a day (BID) | ORAL | Status: DC

## 2014-04-03 MED ORDER — CYCLOBENZAPRINE HCL 10 MG PO TABS: 20.0000 mg | ORAL_TABLET | Freq: Three times a day (TID) | ORAL | Status: DC | PRN

## 2014-04-03 NOTE — Telephone Encounter (Signed)
Ok

## 2014-04-03 NOTE — Addendum Note (Signed)
Addended by: Azucena FreedMILLNER, ALISHA C on: 04/03/2014 03:30 PM   Modules accepted: Orders

## 2014-04-03 NOTE — Telephone Encounter (Signed)
Rx sent to pharmacy.  Left a message for pt making aware.

## 2014-04-03 NOTE — Telephone Encounter (Signed)
Pt states she has IBS and is going out of town for 2 wks. Wants to carry the rx w/ her.would like to know if you will refill for her dicyclomine (BENTYL) 10 MG capsule  Also cyclobenzaprine (FLEXERIL) 10 MG tablet  90 day  [facility]/ [address]

## 2014-05-09 ENCOUNTER — Ambulatory Visit (INDEPENDENT_AMBULATORY_CARE_PROVIDER_SITE_OTHER): Payer: BC Managed Care – PPO | Admitting: Podiatry

## 2014-05-09 ENCOUNTER — Ambulatory Visit (INDEPENDENT_AMBULATORY_CARE_PROVIDER_SITE_OTHER): Payer: BC Managed Care – PPO

## 2014-05-09 DIAGNOSIS — M722 Plantar fascial fibromatosis: Secondary | ICD-10-CM

## 2014-05-09 MED ORDER — METHYLPREDNISOLONE (PAK) 4 MG PO TABS: ORAL_TABLET | ORAL | Status: DC

## 2014-05-09 MED ORDER — MELOXICAM 15 MG PO TABS: 15.0000 mg | ORAL_TABLET | Freq: Every day | ORAL | Status: DC

## 2014-05-09 NOTE — Patient Instructions (Signed)
Plantar Fasciitis (Heel Spur Syndrome) with Rehab The plantar fascia is a fibrous, ligament-like, soft-tissue structure that spans the bottom of the foot. Plantar fasciitis is a condition that causes pain in the foot due to inflammation of the tissue. SYMPTOMS   Pain and tenderness on the underneath side of the foot.  Pain that worsens with standing or walking. CAUSES  Plantar fasciitis is caused by irritation and injury to the plantar fascia on the underneath side of the foot. Common mechanisms of injury include:  Direct trauma to bottom of the foot.  Damage to a small nerve that runs under the foot where the main fascia attaches to the heel bone.  Stress placed on the plantar fascia due to bone spurs. RISK INCREASES WITH:   Activities that place stress on the plantar fascia (running, jumping, pivoting, or cutting).  Poor strength and flexibility.  Improperly fitted shoes.  Tight calf muscles.  Flat feet.  Failure to warm-up properly before activity.  Obesity. PREVENTION  Warm up and stretch properly before activity.  Allow for adequate recovery between workouts.  Maintain physical fitness:  Strength, flexibility, and endurance.  Cardiovascular fitness.  Maintain a health body weight.  Avoid stress on the plantar fascia.  Wear properly fitted shoes, including arch supports for individuals who have flat feet. PROGNOSIS  If treated properly, then the symptoms of plantar fasciitis usually resolve without surgery. However, occasionally surgery is necessary. RELATED COMPLICATIONS   Recurrent symptoms that may result in a chronic condition.  Problems of the lower back that are caused by compensating for the injury, such as limping.  Pain or weakness of the foot during push-off following surgery.  Chronic inflammation, scarring, and partial or complete fascia tear, occurring more often from repeated injections. TREATMENT  Treatment initially involves the use of  ice and medication to help reduce pain and inflammation. The use of strengthening and stretching exercises may help reduce pain with activity, especially stretches of the Achilles tendon. These exercises may be performed at home or with a therapist. Your caregiver may recommend that you use heel cups of arch supports to help reduce stress on the plantar fascia. Occasionally, corticosteroid injections are given to reduce inflammation. If symptoms persist for greater than 6 months despite non-surgical (conservative), then surgery may be recommended.  MEDICATION   If pain medication is necessary, then nonsteroidal anti-inflammatory medications, such as aspirin and ibuprofen, or other minor pain relievers, such as acetaminophen, are often recommended.  Do not take pain medication within 7 days before surgery.  Prescription pain relievers may be given if deemed necessary by your caregiver. Use only as directed and only as much as you need.  Corticosteroid injections may be given by your caregiver. These injections should be reserved for the most serious cases, because they may only be given a certain number of times. HEAT AND COLD  Cold treatment (icing) relieves pain and reduces inflammation. Cold treatment should be applied for 10 to 15 minutes every 2 to 3 hours for inflammation and pain and immediately after any activity that aggravates your symptoms. Use ice packs or massage the area with a piece of ice (ice massage).  Heat treatment may be used prior to performing the stretching and strengthening activities prescribed by your caregiver, physical therapist, or athletic trainer. Use a heat pack or soak the injury in warm water. SEEK IMMEDIATE MEDICAL CARE IF:  Treatment seems to offer no benefit, or the condition worsens.  Any medications produce adverse side effects. EXERCISES RANGE   OF MOTION (ROM) AND STRETCHING EXERCISES - Plantar Fasciitis (Heel Spur Syndrome) These exercises may help you  when beginning to rehabilitate your injury. Your symptoms may resolve with or without further involvement from your physician, physical therapist or athletic trainer. While completing these exercises, remember:   Restoring tissue flexibility helps normal motion to return to the joints. This allows healthier, less painful movement and activity.  An effective stretch should be held for at least 30 seconds.  A stretch should never be painful. You should only feel a gentle lengthening or release in the stretched tissue. RANGE OF MOTION - Toe Extension, Flexion  Sit with your right / left leg crossed over your opposite knee.  Grasp your toes and gently pull them back toward the top of your foot. You should feel a stretch on the bottom of your toes and/or foot.  Hold this stretch for __________ seconds.  Now, gently pull your toes toward the bottom of your foot. You should feel a stretch on the top of your toes and or foot.  Hold this stretch for __________ seconds. Repeat __________ times. Complete this stretch __________ times per day.  RANGE OF MOTION - Ankle Dorsiflexion, Active Assisted  Remove shoes and sit on a chair that is preferably not on a carpeted surface.  Place right / left foot under knee. Extend your opposite leg for support.  Keeping your heel down, slide your right / left foot back toward the chair until you feel a stretch at your ankle or calf. If you do not feel a stretch, slide your bottom forward to the edge of the chair, while still keeping your heel down.  Hold this stretch for __________ seconds. Repeat __________ times. Complete this stretch __________ times per day.  STRETCH - Gastroc, Standing  Place hands on wall.  Extend right / left leg, keeping the front knee somewhat bent.  Slightly point your toes inward on your back foot.  Keeping your right / left heel on the floor and your knee straight, shift your weight toward the wall, not allowing your back to  arch.  You should feel a gentle stretch in the right / left calf. Hold this position for __________ seconds. Repeat __________ times. Complete this stretch __________ times per day. STRETCH - Soleus, Standing  Place hands on wall.  Extend right / left leg, keeping the other knee somewhat bent.  Slightly point your toes inward on your back foot.  Keep your right / left heel on the floor, bend your back knee, and slightly shift your weight over the back leg so that you feel a gentle stretch deep in your back calf.  Hold this position for __________ seconds. Repeat __________ times. Complete this stretch __________ times per day. STRETCH - Gastrocsoleus, Standing  Note: This exercise can place a lot of stress on your foot and ankle. Please complete this exercise only if specifically instructed by your caregiver.   Place the ball of your right / left foot on a step, keeping your other foot firmly on the same step.  Hold on to the wall or a rail for balance.  Slowly lift your other foot, allowing your body weight to press your heel down over the edge of the step.  You should feel a stretch in your right / left calf.  Hold this position for __________ seconds.  Repeat this exercise with a slight bend in your right / left knee. Repeat __________ times. Complete this stretch __________ times per day.    STRENGTHENING EXERCISES - Plantar Fasciitis (Heel Spur Syndrome)  These exercises may help you when beginning to rehabilitate your injury. They may resolve your symptoms with or without further involvement from your physician, physical therapist or athletic trainer. While completing these exercises, remember:   Muscles can gain both the endurance and the strength needed for everyday activities through controlled exercises.  Complete these exercises as instructed by your physician, physical therapist or athletic trainer. Progress the resistance and repetitions only as guided. STRENGTH -  Towel Curls  Sit in a chair positioned on a non-carpeted surface.  Place your foot on a towel, keeping your heel on the floor.  Pull the towel toward your heel by only curling your toes. Keep your heel on the floor.  If instructed by your physician, physical therapist or athletic trainer, add ____________________ at the end of the towel. Repeat __________ times. Complete this exercise __________ times per day. STRENGTH - Ankle Inversion  Secure one end of a rubber exercise band/tubing to a fixed object (table, pole). Loop the other end around your foot just before your toes.  Place your fists between your knees. This will focus your strengthening at your ankle.  Slowly, pull your big toe up and in, making sure the band/tubing is positioned to resist the entire motion.  Hold this position for __________ seconds.  Have your muscles resist the band/tubing as it slowly pulls your foot back to the starting position. Repeat __________ times. Complete this exercises __________ times per day.  Document Released: 09/22/2005 Document Revised: 12/15/2011 Document Reviewed: 01/04/2009 Exit[employer] Patient Information 2015 [employer]. This information is not intended to replace advice given to you by your health care provider. Make sure you discuss any questions you have with your health care provider.  

## 2014-05-09 NOTE — Progress Notes (Signed)
She presents today with a chief complaint of pain to the left heel.  Objective: Vital signs are stable she is alert and oriented x3. Pulses are strongly palpable left. She has pain on palpation medial continued tubercle of the left heel. No calf pain. Radiographic evaluation does demonstrate soft tissue increase in density at the plantar fascial calcaneal insertion site. No signs of infection no signs of fractures.  Assessment: Plantar fasciitis left.  Plan: Started her on Medrol Dosepak to be followed by meloxicam Injected the left heel today. Put her in a plantar fascial night splint and a day brace. Discussed appropriate shoe gear stretching exercises ice therapy shoe gear modifications. Followup with her in one month.

## 2014-05-12 ENCOUNTER — Telehealth: Payer: Self-pay | Admitting: *Deleted

## 2014-05-12 DIAGNOSIS — IMO0001 Reserved for inherently not codable concepts without codable children: Secondary | ICD-10-CM

## 2014-05-12 DIAGNOSIS — E1165 Type 2 diabetes mellitus with hyperglycemia: Principal | ICD-10-CM

## 2014-05-12 NOTE — Telephone Encounter (Signed)
Left message on machine for patient to schedule an appointment to follow up with diabetes A1c, bmet, micro albumin Diabetic bundle My chart message sent

## 2014-05-23 ENCOUNTER — Encounter: Payer: Self-pay | Admitting: Internal Medicine

## 2014-06-06 ENCOUNTER — Ambulatory Visit: Payer: BC Managed Care – PPO | Admitting: Podiatry

## 2014-06-15 ENCOUNTER — Ambulatory Visit (INDEPENDENT_AMBULATORY_CARE_PROVIDER_SITE_OTHER): Payer: BC Managed Care – PPO | Admitting: Podiatry

## 2014-06-15 ENCOUNTER — Encounter: Payer: Self-pay | Admitting: Podiatry

## 2014-06-15 VITALS — BP 137/85 | HR 84 | Resp 16

## 2014-06-15 DIAGNOSIS — M722 Plantar fascial fibromatosis: Secondary | ICD-10-CM

## 2014-06-15 NOTE — Progress Notes (Signed)
She presents today for followup of plantar fasciitis left foot. This is as it was previously. She wears sandals rather than tennis shoes.  Objective: Vital signs are stable she is alert and oriented x3. Pulses are palpable bilateral. She has pain on palpation left foot.  Assessment: Plantar fasciitis left.  Plan: Injected the left foot today and local anesthetic to appropriate stretching exercises ice therapy and shoe gear modifications. Discussed the use of orthotics.

## 2014-07-11 ENCOUNTER — Ambulatory Visit: Payer: BC Managed Care – PPO | Admitting: Podiatry

## 2014-07-13 ENCOUNTER — Encounter: Payer: Self-pay | Admitting: Physician Assistant

## 2014-07-13 ENCOUNTER — Ambulatory Visit (INDEPENDENT_AMBULATORY_CARE_PROVIDER_SITE_OTHER): Payer: BC Managed Care – PPO | Admitting: Physician Assistant

## 2014-07-13 VITALS — BP 120/76 | HR 84 | Temp 97.4°F | Resp 18 | Wt 163.3 lb

## 2014-07-13 DIAGNOSIS — J029 Acute pharyngitis, unspecified: Secondary | ICD-10-CM

## 2014-07-13 DIAGNOSIS — R112 Nausea with vomiting, unspecified: Secondary | ICD-10-CM

## 2014-07-13 MED ORDER — PROMETHAZINE HCL 12.5 MG PO TABS: 12.5000 mg | ORAL_TABLET | Freq: Three times a day (TID) | ORAL | Status: DC | PRN

## 2014-07-13 NOTE — Patient Instructions (Addendum)
We will call you with the lab results when available.  Try salt water gargles and sore throat lozenges to help sore throat symptoms.   Try taking tylenol or ibuprofen to help with pain symptoms.  Push fluid hydration with water.  Continue Over the Counter Mucinex for thick secretions  Force NON dairy fluids, drinking plenty of water is best.    Saline Irrigation and Saline Sprays can also help reduce symptoms.  Phenergan every 8 hours as needed for nausea/vomiting.   If emergency symptoms discussed during visit developed, seek medical attention immediately.  Followup as needed, or for worsening or persistent symptoms despite treatment.    Sore Throat A sore throat is a painful, burning, sore, or scratchy feeling of the throat. There may be pain or tenderness when swallowing or talking. You may have other symptoms with a sore throat. These include coughing, sneezing, fever, or a swollen neck. A sore throat is often the first sign of another sickness. These sicknesses may include a cold, flu, strep throat, or an infection called mono. Most sore throats go away without medical treatment.  HOME CARE   Only take medicine as told by your doctor.  Drink enough fluids to keep your pee (urine) clear or pale yellow.  Rest as needed.  Try using throat sprays, lozenges, or suck on hard candy (if older than 4 years or as told).  Sip warm liquids, such as broth, herbal tea, or warm water with honey. Try sucking on frozen ice pops or drinking cold liquids.  Rinse the mouth (gargle) with salt water. Mix 1 teaspoon salt with 8 ounces of water.  Do not smoke. Avoid being around others when they are smoking.  Put a humidifier in your bedroom at night to moisten the air. You can also turn on a hot shower and sit in the bathroom for 5-10 minutes. Be sure the bathroom door is closed. GET HELP RIGHT AWAY IF:   You have trouble breathing.  You cannot swallow fluids, soft foods, or your spit  (saliva).  You have more puffiness (swelling) in the throat.  Your sore throat does not get better in 7 days.  You feel sick to your stomach (nauseous) and throw up (vomit).  You have a fever or lasting symptoms for more than 2-3 days.  You have a fever and your symptoms suddenly get worse. MAKE SURE YOU:   Understand these instructions.  Will watch your condition.  Will get help right away if you are not doing well or get worse. Document Released: 07/01/2008 Document Revised: 06/16/2012 Document Reviewed: 05/30/2012 [facility] Patient Information 2015 [employer] [place]Care, [place]. This information is not intended to replace advice given to you by your health care provider. Make sure you discuss any questions you have with your health care provider.

## 2014-07-13 NOTE — Progress Notes (Signed)
Pre visit review using our clinic review tool, if applicable. No additional management support is needed unless otherwise documented below in the visit note. 

## 2014-07-13 NOTE — Progress Notes (Signed)
Subjective:    Patient ID: [name], female    DOB: 1966/01/19, 48 y.o.   MRN: [id number]  Sore Throat  This is a new problem. The current episode started in the past 7 days (5 days). The problem has been gradually worsening. Neither side of throat is experiencing more pain than the other. The maximum temperature recorded prior to her arrival was 100 - 100.9 F (mostly 99+ but no greater than 100). The pain is at a severity of 7/10. Associated symptoms include headaches, a hoarse voice, neck pain and vomiting. Pertinent negatives include no abdominal pain, congestion, coughing, diarrhea, drooling, ear discharge, ear pain, plugged ear sensation, shortness of breath, stridor, swollen glands or trouble swallowing. She has had exposure to strep 11(17 y/o son has strep). She has had no exposure to mono. Treatments tried: mucinex, throat lozenge. The treatment provided mild relief.      Review of Systems  Constitutional: Positive for fever, chills and fatigue.  HENT: Positive for hoarse voice. Negative for congestion, drooling, ear discharge, ear pain and trouble swallowing.   Respiratory: Negative for cough, shortness of breath and stridor.   Cardiovascular: Negative for chest pain.  Gastrointestinal: Positive for nausea and vomiting. Negative for abdominal pain and diarrhea.  Musculoskeletal: Positive for neck pain.  Neurological: Positive for headaches. Negative for syncope.  All other systems reviewed and are negative.    Past Medical History  Diagnosis Date  . ALLERGIC RHINITIS 10/08/2007  . ASTHMA 04/22/2007  . GERD 04/22/2007  . Headache(784.0) 10/08/2007  . HYPERTENSION 04/22/2007  . HYPERLIPIDEMIA 04/22/2007  . HYPERTENSION 04/22/2007  . LOW BACK PAIN 06/27/2008  . LUQ PAIN 09/06/2010  . NAUSEA 09/06/2010  . IBS (irritable bowel syndrome)     constipation predominant  . PONV (postoperative nausea and vomiting)   . Diabetes mellitus without complication   . Arthritis     History     Social History  . Marital Status: Married    Spouse Name: N/A    Number of Children: 2  . Years of Education: 16   Occupational History  . substitute [employer]   Social History Main Topics  . Smoking status: Never Smoker   . Smokeless tobacco: Never Used  . Alcohol Use: No  . Drug Use: No  . Sexual Activity: Yes    Partners: Male   Other Topics Concern  . Not on file   Social History Narrative   [employer]. Married 96'. 1 son '98, 1 daughter '08. 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 SO in good health. 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 Daily Caffeine Use    Past Surgical History  Procedure Laterality Date  . Knee surgery  89, 96, 01, 08    right  . Knee surgery  89    left  . Nasal polyp surgery  89    sinus  . Lumbar fusion  05    L4-5 S1  . Tubal ligation    . Cesarean section    . Knee arthroplasty Right 04/29/2013    Procedure: COMPUTER ASSISTED TOTAL KNEE ARTHROPLASTY;  Surgeon: Eldred MangesMark C Yates, MD;  Location: [facility] OR;  Service: Orthopedics;  Laterality: Right;  Right Total Knee Arthroplasty, Cemented    Family History  Problem Relation Age of Onset  . Hypertension Mother   . Arthritis Mother  OA s/p bilateral hip replacement  . Diabetes Father   . Heart disease Father     CAD  . Hyperlipidemia Father   . Hypertension Father   . Pulmonary fibrosis Father   . Osteopenia Sister     older with GI problems s/p colectomy  . Cancer Maternal Grandmother     Ovarian Cancer  . Osteopenia Sister     Allergies  Allergen Reactions  . Penicillins     Severe reaction - hospitlaized    Current Outpatient Prescriptions on File Prior to Visit  Medication Sig Dispense Refill  . acetaminophen-codeine (TYLENOL #3) 300-30 MG per tablet Take 1 tablet by mouth as needed.  30 tablet  2  . albuterol (PROVENTIL HFA;VENTOLIN HFA) 108 (90  BASE) MCG/ACT inhaler Inhale 2 puffs into the lungs 4 (four) times daily as needed for wheezing or shortness of breath.  18 g  11  . albuterol (PROVENTIL) (2.5 MG/3ML) 0.083% nebulizer solution Take 3 mLs (2.5 mg total) by nebulization every 6 (six) hours as needed for wheezing.  75 mL  12  . atorvastatin (LIPITOR) 40 MG tablet Take 1 tablet (40 mg total) by mouth daily.  90 tablet  3  . butalbital-acetaminophen-caffeine (FIORICET, ESGIC) 50-325-40 MG per tablet TAKE ONE TABLET BY MOUTH TWICE DAILY AS NEEDED   14 tablet  2  . cetirizine-pseudoephedrine (ZYRTEC-D) 5-120 MG per tablet Take 1 tablet by mouth daily as needed for allergies.  30 tablet  11  . cyclobenzaprine (FLEXERIL) 10 MG tablet Take 2 tablets (20 mg total) by mouth 3 (three) times daily.  30 tablet  0  . dicyclomine (BENTYL) 10 MG capsule Take 1 capsule (10 mg total) by mouth 2 (two) times daily.  60 capsule  0  . doxycycline (VIBRA-TABS) 100 MG tablet Take 1 tablet (100 mg total) by mouth 2 (two) times daily.  20 tablet  0  . fluocinonide cream (LIDEX) 0.05 % Apply to affected area  twice daily  60 g  1  . FLUoxetine (PROZAC) 20 MG capsule TAKE ONE CAPSULE BY MOUTH DAILY  30 capsule  5  . FLUoxetine (PROZAC) 20 MG tablet Take 1 tablet (20 mg total) by mouth daily.  30 tablet  3  . Fluticasone Furoate-Vilanterol (BREO ELLIPTA) 100-25 MCG/INH AEPB 17mcg/25mcg. 1 inhalation once daily. Swish and spit after dose to avoid thrush.  1 each  0  . glimepiride (AMARYL) 2 MG tablet Take 1 tablet (2 mg total) by mouth daily before breakfast.  30 tablet  5  . glimepiride (AMARYL) 2 MG tablet TAKE 1 TABLET (2 MG = NEW DOSE) BY MOUTH DAILY BEFORE BREAKFAST.  30 tablet  5  . lisinopril-hydrochlorothiazide (PRINZIDE,ZESTORETIC) 10-12.5 MG per tablet Take 1 tablet by mouth daily.  90 tablet  3  . meloxicam (MOBIC) 15 MG tablet Take 1 tablet (15 mg total) by mouth daily.  30 tablet  3  . methylPREDNIsolone (MEDROL DOSPACK) 4 MG tablet 6 day tapering  dose-follow package instructions  21 tablet  0  . methylPREDNISolone acetate (DEPO-MEDROL) 80 MG/ML injection Inject 1 mL (80 mg total) into the muscle once.  5 mL  0  . mineral oil liquid Take 30 mLs by mouth daily as needed for constipation.      . montelukast (SINGULAIR) 10 MG tablet Take 1 tablet (10 mg total) by mouth daily.  90 tablet  3  . Multiple Vitamin (MULTIVITAMIN) tablet Take 1 tablet by mouth daily.       Marland Kitchen  nabumetone (RELAFEN) 750 MG tablet Take 2 tablets (1,500 mg total) by mouth at bedtime.  60 tablet  11  . naproxen (NAPROSYN) 500 MG tablet       . omeprazole-sodium bicarbonate (ZEGERID) 40-1100 MG per capsule Take 1 capsule by mouth daily.        No current facility-administered medications on file prior to visit.    EXAM: BP 120/76  Pulse 84  Temp(Src) 97.4 F (36.3 C) (Oral)  Resp 18  Wt 163 lb 4.8 oz (74.072 kg)     Objective:   Physical Exam  Nursing note and vitals reviewed. Constitutional: She is oriented to person, place, and time. She appears well-developed and well-nourished. No distress.  HENT:  Head: Normocephalic and atraumatic.  Right Ear: External ear normal.  Left Ear: External ear normal.  Nose: Nose normal.  Mouth/Throat: No oropharyngeal exudate.  Oropharynx is slightly erythematous, no exudate. Bilateral TMs normal. Bilateral frontal and maxillary sinuses non-TTP.  Eyes: Conjunctivae and EOM are normal. Pupils are equal, round, and reactive to light.  Neck: Normal range of motion. Neck supple.  Cardiovascular: Normal rate, regular rhythm and intact distal pulses.   Pulmonary/Chest: Effort normal and breath sounds normal. No stridor. No respiratory distress. She has no wheezes. She has no rales. She exhibits no tenderness.  Lymphadenopathy:    She has no cervical adenopathy.  Neurological: She is alert and oriented to person, place, and time.  Skin: Skin is warm and dry. No rash noted. She is not diaphoretic. No pallor.  Psychiatric: She  has a normal mood and affect. Her behavior is normal. Judgment and thought content normal.     Lab Results  Component Value Date   WBC 6.7 05/02/2013   HGB 8.5* 05/02/2013   HCT 26.8* 05/02/2013   PLT 198 05/02/2013   GLUCOSE 134* 04/30/2013   CHOL 225* 10/04/2013   TRIG 346.0* 10/04/2013   HDL 58.40 10/04/2013   LDLDIRECT 125.5 10/04/2013   LDLCALC 119* 11/17/2011   ALT 28 04/27/2013   AST 31 04/27/2013   NA 135 04/30/2013   K 4.0 04/30/2013   CL 100 04/30/2013   CREATININE 0.79 04/30/2013   BUN 11 04/30/2013   CO2 28 04/30/2013   TSH 1.86 08/30/2010   INR 0.98 04/27/2013   HGBA1C 6.5* 01/07/2008        Assessment & Plan:  [name] was seen today for sore throat.  Diagnoses and associated orders for this visit:  Sore throat Comments: Rapid strep negative. Will obtain culture. Symptom treatment with salt water gargles, sore throat lozenges. - POC Rapid Strep A - Throat culture ([facility])  Non-intractable vomiting with nausea, vomiting of unspecified type Comments: Happens with illneses per pt, phenergan works well. Trial of phenergan. - promethazine (PHENERGAN) 12.5 MG tablet; Take 1 tablet (12.5 mg total) by mouth every 8 (eight) hours as needed for nausea or vomiting.    Return precautions provided, and patient handout on sore throat.  Plan to follow up as needed, or for worsening or persistent symptoms despite treatment.  Patient Instructions  We will call you with the lab results when available.  Try salt water gargles and sore throat lozenges to help sore throat symptoms.   Try taking tylenol or ibuprofen to help with pain symptoms.  Push fluid hydration with water.  Continue Over the Counter Mucinex for thick secretions  Force NON dairy fluids, drinking plenty of water is best.    Saline Irrigation and Saline Sprays can also help reduce  symptoms.  Phenergan every 8 hours as needed for nausea/vomiting.   If emergency symptoms discussed during visit developed, seek  medical attention immediately.  Followup as needed, or for worsening or persistent symptoms despite treatment.

## 2014-07-15 LAB — CULTURE, GROUP A STREP: Organism ID, Bacteria: NORMAL

## 2014-07-18 ENCOUNTER — Telehealth: Payer: Self-pay | Admitting: Physician Assistant

## 2014-07-18 NOTE — Telephone Encounter (Signed)
[facility] [place] [address] is requesting re-fill on glimepiride (AMARYL) 2 MG tablet

## 2014-07-19 MED ORDER — GLIMEPIRIDE 2 MG PO TABS: 2.0000 mg | ORAL_TABLET | Freq: Every day | ORAL | Status: DC

## 2014-07-19 NOTE — Telephone Encounter (Signed)
Rx sent to pharmacy   

## 2014-07-27 ENCOUNTER — Ambulatory Visit: Payer: BC Managed Care – PPO | Admitting: Podiatry

## 2014-08-03 ENCOUNTER — Ambulatory Visit: Payer: BC Managed Care – PPO | Admitting: Podiatry

## 2014-08-08 ENCOUNTER — Ambulatory Visit: Payer: BC Managed Care – PPO | Admitting: Family Medicine

## 2014-08-09 ENCOUNTER — Ambulatory Visit (INDEPENDENT_AMBULATORY_CARE_PROVIDER_SITE_OTHER): Payer: BC Managed Care – PPO | Admitting: Physician Assistant

## 2014-08-09 ENCOUNTER — Encounter: Payer: Self-pay | Admitting: Physician Assistant

## 2014-08-09 ENCOUNTER — Other Ambulatory Visit: Payer: Self-pay

## 2014-08-09 ENCOUNTER — Other Ambulatory Visit (INDEPENDENT_AMBULATORY_CARE_PROVIDER_SITE_OTHER): Payer: BC Managed Care – PPO

## 2014-08-09 VITALS — BP 138/82 | HR 72 | Ht 63.25 in | Wt 168.2 lb

## 2014-08-09 DIAGNOSIS — K5901 Slow transit constipation: Secondary | ICD-10-CM

## 2014-08-09 DIAGNOSIS — R1012 Left upper quadrant pain: Secondary | ICD-10-CM

## 2014-08-09 LAB — BASIC METABOLIC PANEL
BUN: 15 mg/dL (ref 6–23)
CALCIUM: 9.4 mg/dL (ref 8.4–10.5)
CHLORIDE: 103 meq/L (ref 96–112)
CO2: 22 meq/L (ref 19–32)
Creatinine, Ser: 0.8 mg/dL (ref 0.4–1.2)
GFR: 77 mL/min (ref >60.00)
Glucose, Bld: 83 mg/dL (ref 70–99)
POTASSIUM: 4.1 meq/L (ref 3.5–5.1)
SODIUM: 138 meq/L (ref 135–145)

## 2014-08-09 LAB — CBC WITH DIFFERENTIAL/PLATELET
BASOS ABS: 0 10*3/uL (ref 0.0–0.1)
Basophils Relative: 0.5 % (ref 0.0–3.0)
Eosinophils Absolute: 0.1 10*3/uL (ref 0.0–0.7)
Eosinophils Relative: 2.9 % (ref 0.0–5.0)
HCT: 34.3 % — ABNORMAL LOW (ref 36.0–46.0)
Hemoglobin: 10.7 g/dL — ABNORMAL LOW (ref 12.0–15.0)
LYMPHS ABS: 0.9 10*3/uL (ref 0.7–4.0)
LYMPHS PCT: 18.1 % (ref 12.0–46.0)
MCHC: 31.1 g/dL (ref 30.0–36.0)
MCV: 72.9 fl — ABNORMAL LOW (ref 78.0–100.0)
MONOS PCT: 10.3 % (ref 3.0–12.0)
Monocytes Absolute: 0.5 10*3/uL (ref 0.1–1.0)
Neutro Abs: 3.4 10*3/uL (ref 1.4–7.7)
Neutrophils Relative %: 68.2 % (ref 43.0–77.0)
PLATELETS: 234 10*3/uL (ref 150.0–400.0)
RBC: 4.7 Mil/uL (ref 3.87–5.11)
RDW: 17.9 % — ABNORMAL HIGH (ref 11.5–15.5)
WBC: 4.9 10*3/uL (ref 4.0–10.5)

## 2014-08-09 LAB — HIGH SENSITIVITY CRP: CRP, High Sensitivity: 1.81 mg/L (ref 0.000–5.000)

## 2014-08-09 MED ORDER — DICYCLOMINE HCL 10 MG PO CAPS: ORAL_CAPSULE | ORAL | Status: DC

## 2014-08-09 MED ORDER — LINACLOTIDE 145 MCG PO CAPS
145.0000 ug | ORAL_CAPSULE | Freq: Every day | ORAL | Status: DC
Start: 2014-08-09 — End: 2014-09-11

## 2014-08-09 NOTE — Patient Instructions (Signed)
Please go to the basement level to have your labs drawn.  We sent a prescription to [facility] , [address] . 1. Linzess 145 mcg 2. Bentyl ( Dicyclomine)    You have been scheduled for a CT scan of the abdomen and pelvis at [facility] ([address]---this is in the same building as Press photographer).   You are scheduled on 08-10-2014 at 2:30 PM . You should arrive at 2:15 PM  prior to your appointment time for registration. Please follow the written instructions below on the day of your exam:  WARNING: IF YOU ARE ALLERGIC TO IODINE/X-RAY DYE, PLEASE NOTIFY RADIOLOGY IMMEDIATELY AT [id number]! YOU WILL BE GIVEN A 13 HOUR PREMEDICATION PREP.  1) Do not eat or drink anything after 10:30 am  (4 hours prior to your test) 2) You have been given 2 bottles of oral contrast to drink. The solution may taste               better if refrigerated, but do NOT add ice or any other liquid to this solution. Shake             well before drinking.    Drink 1 bottle of contrast @ 12:30 PM  (2 hours prior to your exam)  Drink 1 bottle of contrast @ 1:30 PM  (1 hour prior to your exam)  You may take any medications as prescribed with a small amount of water except for the following: Metformin, Glucophage, Glucovance, Avandamet, Riomet, Fortamet, Actoplus Met, Janumet, Glumetza or Metaglip. The above medications must be held the day of the exam AND 48 hours after the exam.  The purpose of you drinking the oral contrast is to aid in the visualization of your intestinal tract. The contrast solution may cause some diarrhea. Before your exam is started, you will be given a small amount of fluid to drink. Depending on your individual set of symptoms, you may also receive an intravenous injection of x-ray contrast/dye. Plan on being at [facility] for 30 minutes or long, depending on the type of exam you are having performed.  If you have any questions regarding your exam or if you need  to reschedule, you may call the CT department at [phone] between the hours of 8:00 am and 5:00 pm, Monday-Friday.  ________________________________________________________________________

## 2014-08-09 NOTE — Progress Notes (Signed)
Subjective:    Patient ID: [name], female    DOB: [birth year], 48 y.o.   MRN: [id number]  HPI  [name]  Is a pleasant 48 year old white female known to Dr. Carlean Purl. She has prior history of IBS, also with adult onset diabetes mellitus, osteoarthritis and asthma. He also has hypertension and hyperlipidemia. 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 She had undergone colonoscopy in June 2010 which was normal and a: In 2007 showed a somewhat redundant colon with internal hemorrhoids. She says she has had problems with left upper quadrant pain over the past couple of years- it seems to be present most of the time. She says over the past couple months this pain seems to be worse at night sometimes waking her from sleep. She has had back problems and a lumbar fusion from her back. She is also having ongoing issues with gassiness some bloating and chronic constipation. She says she won't have a bowel movement if she doesn't take a laxative of some sort past and tolerate MiraLAX well due to nausea and increased gas. She has been using Bentyl 10 mg at bedtime, not sure if this is helping or not. Appetite is been fine and weight has been stable, no melena or hematochezia.    Review of Systems  Constitutional: Positive for fatigue.  HENT: Negative.   Eyes: Negative.   Respiratory: Negative.   Gastrointestinal: Positive for abdominal pain and constipation.  Endocrine: Negative.   Genitourinary: Negative.   Musculoskeletal: Negative.   Skin: Negative.   Allergic/Immunologic: Negative.   Neurological: Negative.   Hematological: Negative.   Psychiatric/Behavioral: Negative.    Outpatient Prescriptions Prior to Visit  Medication Sig Dispense Refill  . albuterol (PROVENTIL HFA;VENTOLIN HFA) 108 (90 BASE) MCG/ACT inhaler Inhale 2 puffs into the lungs 4 (four) times daily as needed for wheezing or shortness of breath. 18  g 11  . atorvastatin (LIPITOR) 40 MG tablet Take 1 tablet (40 mg total) by mouth daily. 90 tablet 3  . butalbital-acetaminophen-caffeine (FIORICET, ESGIC) 50-325-40 MG per tablet TAKE ONE TABLET BY MOUTH TWICE DAILY AS NEEDED  14 tablet 2  . cetirizine-pseudoephedrine (ZYRTEC-D) 5-120 MG per tablet Take 1 tablet by mouth daily as needed for allergies. 30 tablet 11  . fluocinonide cream (LIDEX) 0.05 % Apply to affected area  twice daily 60 g 1  . Fluticasone Furoate-Vilanterol (BREO ELLIPTA) 100-25 MCG/INH AEPB 132mg/25mcg. 1 inhalation once daily. Swish and spit after dose to avoid thrush. 1 each 0  . glimepiride (AMARYL) 2 MG tablet TAKE 1 TABLET (2 MG = NEW DOSE) BY MOUTH DAILY BEFORE BREAKFAST. 30 tablet 5  . lisinopril-hydrochlorothiazide (PRINZIDE,ZESTORETIC) 10-12.5 MG per tablet Take 1 tablet by mouth daily. 90 tablet 3  . meloxicam (MOBIC) 15 MG tablet Take 1 tablet (15 mg total) by mouth daily. 30 tablet 3  . mineral oil liquid Take 30 mLs by mouth daily as needed for constipation.    . montelukast (SINGULAIR) 10 MG tablet Take 1 tablet (10 mg total) by mouth daily. 90 tablet 3  . Multiple Vitamin (MULTIVITAMIN) tablet Take 1 tablet by mouth daily.     .Marland Kitchenomeprazole-sodium bicarbonate (ZEGERID) 40-1100 MG per capsule Take 1 capsule by mouth daily.     . promethazine (PHENERGAN) 12.5 MG tablet Take 1 tablet (12.5 mg total) by mouth every 8 (eight) hours as needed for nausea or vomiting. 20 tablet 0  .  acetaminophen-codeine (TYLENOL #3) 300-30 MG per tablet Take 1 tablet by mouth as needed. 30 tablet 2  . albuterol (PROVENTIL) (2.5 MG/3ML) 0.083% nebulizer solution Take 3 mLs (2.5 mg total) by nebulization every 6 (six) hours as needed for wheezing. 75 mL 12  . cyclobenzaprine (FLEXERIL) 10 MG tablet Take 2 tablets (20 mg total) by mouth 3 (three) times daily. 30 tablet 0  . dicyclomine (BENTYL) 10 MG capsule Take 1 capsule (10 mg total) by mouth 2 (two) times daily. 60 capsule 0  .  doxycycline (VIBRA-TABS) 100 MG tablet Take 1 tablet (100 mg total) by mouth 2 (two) times daily. 20 tablet 0  . FLUoxetine (PROZAC) 20 MG capsule TAKE ONE CAPSULE BY MOUTH DAILY 30 capsule 5  . FLUoxetine (PROZAC) 20 MG tablet Take 1 tablet (20 mg total) by mouth daily. 30 tablet 3  . glimepiride (AMARYL) 2 MG tablet Take 1 tablet (2 mg total) by mouth daily before breakfast. 30 tablet 5  . methylPREDNIsolone (MEDROL DOSPACK) 4 MG tablet 6 day tapering dose-follow package instructions 21 tablet 0  . methylPREDNISolone acetate (DEPO-MEDROL) 80 MG/ML injection Inject 1 mL (80 mg total) into the muscle once. 5 mL 0  . nabumetone (RELAFEN) 750 MG tablet Take 2 tablets (1,500 mg total) by mouth at bedtime. 60 tablet 11  . naproxen (NAPROSYN) 500 MG tablet      No facility-administered medications prior to visit.   Allergies  Allergen Reactions  . Penicillins     Severe reaction - hospitlaized   Patient Active Problem List   Diagnosis Date Noted  . Anxiety state, unspecified 08/01/2013  . Osteoarthritis of right knee 04/29/2013    Class: Diagnosis of  . 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 Routine health maintenance 11/17/2011  . LUQ PAIN 09/06/2010  . LOW BACK PAIN 06/27/2008  . ALLERGIC RHINITIS 10/08/2007  . Headache 10/08/2007  . Type II or unspecified type diabetes mellitus without mention of complication, uncontrolled 10/08/2007  . HYPERLIPIDEMIA 04/22/2007  . HYPERTENSION 04/22/2007  . ASTHMA 04/22/2007  . GERD 04/22/2007   History  Substance Use Topics  . Smoking status: Never Smoker   . Smokeless tobacco: Never Used  . Alcohol Use: No   family history includes Arthritis in her mother; Cancer in her maternal grandmother; Diabetes in her father; Heart disease in her father; Hyperlipidemia in her father; Hypertension in her father and mother; Osteopenia in her sister and sister; Pulmonary fibrosis in her father.     Objective:   Physical Exam  Well-developed white female in no  acute distress, pleasant blood pressure 138/82 pulse 72 height 5 foot 3 weight 168. HEENT; nontraumatic normocephalic EOMI PERRLA sclera anicteric, Supple; no JVD, Cardiovascular regular rate and rhythm with S1-S2 no murmur rub or gallop, Pulmonary ;clear bilaterally, Abdomen ;soft she is tender in the left upper quadrant there is no guarding or rebound no palpable mass or hepatosplenomegaly bowel sounds are present, Rectal; exam not done, Extremities ;no clubbing cyanosis or edema skin warm and dry, Psych; mood and affect appropriate        Assessment & Plan:   #64  48 year old female with prior diagnosis of IBS who comes in with increasing left upper quadrant pain over the past couple of months often waking her from sleep. This is in the setting of chronic problems with constipation and gas. Pain may be secondary to splenic flexure spasm rule out other intra-abdominal inflammatory process. #2 Hypertension #3 hyperlipidemia #4 asthma #5 adult-onset diabetes mellitus #6 osteoarthritis  Plan; CBC with differential, be met and CRP Schedule for CT scan of the abdomen and pelvis with contrast Refill Bentyl 10 mg by mouth daily at bedtime qhs as needed Add daily probiotic Add a trial of Linzess 145 g by mouth every morning. She will try this over the next couple of weeks and if she is not finding this unofficial can increase to 290 g daily. She will call back with a progress report.

## 2014-08-10 ENCOUNTER — Other Ambulatory Visit (INDEPENDENT_AMBULATORY_CARE_PROVIDER_SITE_OTHER): Payer: BC Managed Care – PPO

## 2014-08-10 ENCOUNTER — Ambulatory Visit (INDEPENDENT_AMBULATORY_CARE_PROVIDER_SITE_OTHER): Payer: BC Managed Care – PPO | Admitting: Podiatry

## 2014-08-10 ENCOUNTER — Ambulatory Visit (INDEPENDENT_AMBULATORY_CARE_PROVIDER_SITE_OTHER)
Admission: RE | Admit: 2014-08-10 | Discharge: 2014-08-10 | Disposition: A | Discharge status: Home / Self Care | Payer: BC Managed Care – PPO | Source: Ambulatory Visit | Attending: Physician Assistant | Admitting: Physician Assistant

## 2014-08-10 ENCOUNTER — Encounter: Payer: Self-pay | Admitting: Podiatry

## 2014-08-10 VITALS — BP 133/82 | HR 78 | Resp 16

## 2014-08-10 DIAGNOSIS — D509 Iron deficiency anemia, unspecified: Secondary | ICD-10-CM

## 2014-08-10 DIAGNOSIS — R1012 Left upper quadrant pain: Secondary | ICD-10-CM

## 2014-08-10 DIAGNOSIS — M722 Plantar fascial fibromatosis: Secondary | ICD-10-CM

## 2014-08-10 LAB — IRON: IRON: 15 ug/dL — AB (ref 42–145)

## 2014-08-10 MED ORDER — IOHEXOL 300 MG/ML  SOLN: 100.0000 mL | Freq: Once | INTRAMUSCULAR | Status: AC | PRN

## 2014-08-11 NOTE — Progress Notes (Signed)
She presents today for follow-up of her plantar fasciitis of her left foot. She states that the medial side of the foot still hurts but the lateral side still hurts the worst. She thinks physical therapy would be her best option.  Objective: Pulses are strongly palpable left. She has pain on palpation medial continue tubercle of her left heel. Her left foot sits slightly inverted and she has trouble everting her left foot. She does have a history of back trauma and sciatica.  Assessment: Plantar fasciitis bilateral compensatory syndrome as well as peroneal tendon and muscle weakness.  Plan: Discussed etiology pathology conservative versus surgical therapies. At this point we will send her to physical therapy. I will follow-up with her in 1 month.

## 2014-08-13 NOTE — Progress Notes (Signed)
Agree with Ms. Oswald Hillocksterwood's assessment and plan. Iva Booparl E. Gessner, MD, [facility]   CT was ok. Seems functional with exacerbation occurring around time of child's illness.

## 2014-08-14 ENCOUNTER — Other Ambulatory Visit: Payer: Self-pay

## 2014-08-14 DIAGNOSIS — D509 Iron deficiency anemia, unspecified: Secondary | ICD-10-CM

## 2014-08-14 MED ORDER — INTEGRA PLUS PO CAPS: ORAL_CAPSULE | ORAL | Status: DC

## 2014-08-16 ENCOUNTER — Other Ambulatory Visit (INDEPENDENT_AMBULATORY_CARE_PROVIDER_SITE_OTHER): Payer: BC Managed Care – PPO

## 2014-08-16 ENCOUNTER — Other Ambulatory Visit: Payer: Self-pay

## 2014-08-16 DIAGNOSIS — D509 Iron deficiency anemia, unspecified: Secondary | ICD-10-CM

## 2014-08-16 LAB — HEMOCCULT SLIDES (X 3 CARDS)
Fecal Occult Blood: NEGATIVE
OCCULT 1: NEGATIVE
OCCULT 2: NEGATIVE
OCCULT 3: NEGATIVE
OCCULT 4: NEGATIVE
OCCULT 5: NEGATIVE

## 2014-08-22 DIAGNOSIS — M5126 Other intervertebral disc displacement, lumbar region: Secondary | ICD-10-CM | POA: Insufficient documentation

## 2014-08-22 DIAGNOSIS — M5416 Radiculopathy, lumbar region: Secondary | ICD-10-CM | POA: Insufficient documentation

## 2014-08-28 ENCOUNTER — Telehealth: Payer: Self-pay | Admitting: Physician Assistant

## 2014-08-28 NOTE — Telephone Encounter (Signed)
Left message for pt to call back  °

## 2014-08-28 NOTE — Telephone Encounter (Signed)
Diarrhea started last week, almost like gas, but it was very loose stool. She has this chiefly in the morning and within a short time after taking the iron. She reports she does tolerate Centrum Gummies. She does not want to take the Integra anymore.

## 2014-08-28 NOTE — Telephone Encounter (Signed)
hemocults are negative. I dont think centrum gummies have enough iron to actually replace  A low iron level- would wonder if the Linzess is what is giving her diarrhea?... Let her stop integra for a week and see if diarrhea stops, then call back and let us know

## 2014-08-29 NOTE — Telephone Encounter (Signed)
Patient advised and agrees to this plan. 

## 2014-09-05 ENCOUNTER — Telehealth: Payer: Self-pay | Admitting: *Deleted

## 2014-09-05 DIAGNOSIS — M51379 Other intervertebral disc degeneration, lumbosacral region without mention of lumbar back pain or lower extremity pain: Secondary | ICD-10-CM | POA: Insufficient documentation

## 2014-09-05 DIAGNOSIS — M5137 Other intervertebral disc degeneration, lumbosacral region: Secondary | ICD-10-CM | POA: Insufficient documentation

## 2014-09-05 NOTE — Telephone Encounter (Signed)
I called and left her a message that the order was sent as requested.

## 2014-09-05 NOTE — Telephone Encounter (Signed)
Pt [facility] - fax orders to [phone].

## 2014-09-11 ENCOUNTER — Encounter: Payer: Self-pay | Admitting: Family Medicine

## 2014-09-11 ENCOUNTER — Ambulatory Visit (INDEPENDENT_AMBULATORY_CARE_PROVIDER_SITE_OTHER): Payer: BC Managed Care – PPO | Admitting: Family Medicine

## 2014-09-11 VITALS — BP 118/78 | HR 93 | Temp 97.8°F | Ht 63.0 in | Wt 167.7 lb

## 2014-09-11 DIAGNOSIS — E785 Hyperlipidemia, unspecified: Secondary | ICD-10-CM

## 2014-09-11 DIAGNOSIS — D509 Iron deficiency anemia, unspecified: Secondary | ICD-10-CM

## 2014-09-11 DIAGNOSIS — M545 Low back pain: Secondary | ICD-10-CM

## 2014-09-11 DIAGNOSIS — K589 Irritable bowel syndrome without diarrhea: Secondary | ICD-10-CM

## 2014-09-11 DIAGNOSIS — I10 Essential (primary) hypertension: Secondary | ICD-10-CM

## 2014-09-11 DIAGNOSIS — K219 Gastro-esophageal reflux disease without esophagitis: Secondary | ICD-10-CM

## 2014-09-11 DIAGNOSIS — J4520 Mild intermittent asthma, uncomplicated: Secondary | ICD-10-CM

## 2014-09-11 DIAGNOSIS — N921 Excessive and frequent menstruation with irregular cycle: Secondary | ICD-10-CM

## 2014-09-11 DIAGNOSIS — E119 Type 2 diabetes mellitus without complications: Secondary | ICD-10-CM

## 2014-09-11 NOTE — Addendum Note (Signed)
Addended by: Terressa KoyanagiKIM, HANNAH R on: 09/11/2014 03:34 PM   Modules accepted: Orders

## 2014-09-11 NOTE — Progress Notes (Addendum)
HPI:  [name] is here to establish care.    Has the following chronic problems that require follow up and concerns today:  DM: -meds:amaryl, acei -reports hgba1c 6.6 at [facility] today -not on metformin? Reports made her have nausea. -diet and exercise: no regular exercise, diet is so so -sees podiatrist -wants referral to nutritionist -last eye exam: had earlier this year, denies diabetic retinopathy -denies: polyuria, polydipsia,wounds, hypoglycemia - occ feels a little shaky and sweaty   HTN: -meds: lisinopril-hctz 10-12.5 -denies: CP, SOB, DOE, swelling  HLD -meds: lipitor -denies: leg cramps, cog changes  IBS/GERD: -managed by GI -meds: on zegerid and phenergan, bentyl and integra  Allergies/Mild Asthma: -meds: alb prn, zyrtec D, singulair -denies: SOB, wheezing  Migraines: -uses fioricet prn  Iron def anemia: -heavy menstrual periods that are irr, last period 10 days and heavy in October and several episodes of bleeding in October -seeing GI for work up  Hx DDD and spinal fusion surgery [place] - [place] Dr[place] Wynetta Emeryram [place] [facility] for low back pain and radiation of pain -doing PT -just had MRI and xrays -takes meloxicam   ROS negative for unless reported above: fevers, unintentional weight loss, hearing or vision loss, chest pain, palpitations, struggling to breath, hemoptysis, melena, hematochezia, hematuria, falls, loc, si, thoughts of self harm  Past Medical History  Diagnosis Date  . ALLERGIC RHINITIS 10/08/2007  . ASTHMA 04/22/2007  . GERD 04/22/2007  . Headache(784.0) 10/08/2007  . HYPERTENSION 04/22/2007  . HYPERLIPIDEMIA 04/22/2007  . HYPERTENSION 04/22/2007  . LOW BACK PAIN 06/27/2008  . LUQ PAIN 09/06/2010  . NAUSEA 09/06/2010  . IBS (irritable bowel syndrome)     constipation predominant  . PONV (postoperative nausea and vomiting)   . Diabetes mellitus without complication   . Arthritis     Past Surgical History  Procedure Laterality Date  . Knee  surgery  92, 96, 01, 08    right  . Knee surgery  88    left  . Nasal polyp surgery  95    sinus  . Lumbar fusion  05    L4-5 S1  . Tubal ligation    . Cesarean section    . Knee arthroplasty Right 04/29/2013    Procedure: COMPUTER ASSISTED TOTAL KNEE ARTHROPLASTY;  Surgeon: Eldred MangesMark C Yates, MD;  Location: [facility] OR;  Service: Orthopedics;  Laterality: Right;  Right Total Knee Arthroplasty, Cemented    Family History  Problem Relation Age of Onset  . Hypertension Mother   . Arthritis Mother     OA s/p bilateral hip replacement  . Diabetes Father   . Heart disease Father     CAD  . Hyperlipidemia Father   . Hypertension Father   . Pulmonary fibrosis Father   . Osteopenia Sister     older with GI problems s/p colectomy  . Cancer Maternal Grandmother     Ovarian Cancer  . Osteopenia Sister     History   Social History  . Marital Status: Married    Spouse Name: N/A    Number of Children: 2  . Years of Education: 16   Occupational History  . substitute teacher [employer]   Social History Main Topics  . Smoking status: Never Smoker   . Smokeless tobacco: Never Used  . Alcohol Use: No  . Drug Use: No  . Sexual Activity:    Partners: Male   Other Topics Concern  . 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, part Armed forces technical officer Situation: son with special needs, sick a lot also 19 yo daughter      Spiritual Beliefs: christian      Lifestyle: no regular exercise, diet in so so          Current outpatient prescriptions: albuterol (PROVENTIL HFA;VENTOLIN HFA) 108 (90 BASE) MCG/ACT inhaler, Inhale 2 puffs into the lungs 4 (four) times daily as needed for wheezing or shortness of breath., Disp: 18 g, Rfl: 11;  atorvastatin (LIPITOR) 40 MG tablet, Take 1 tablet (40 mg total) by mouth daily., Disp: 90 tablet, Rfl: 3 butalbital-acetaminophen-caffeine (FIORICET, ESGIC) 50-325-40 MG per tablet, TAKE ONE TABLET BY MOUTH TWICE DAILY AS NEEDED  , Disp: 14 tablet, Rfl: 2;  cetirizine-pseudoephedrine (ZYRTEC-D) 5-120 MG per tablet, Take 1 tablet by mouth daily as needed for allergies., Disp: 30 tablet, Rfl: 11;  dicyclomine (BENTYL) 10 MG capsule, Take 1 tab at bedtime., Disp: 30 capsule, Rfl: 8 FeFum-FePoly-FA-B Cmp-C-Biot (INTEGRA PLUS) CAPS, Take one tablet every day, Disp: 30 capsule, Rfl: 2;  fluocinonide cream (LIDEX) 0.05 %, Apply to affected area  twice daily, Disp: 60 g, Rfl: 1;  glimepiride (AMARYL) 2 MG tablet, TAKE 1 TABLET (2 MG = NEW DOSE) BY MOUTH DAILY BEFORE BREAKFAST., Disp: 30 tablet, Rfl: 5;  lisinopril-hydrochlorothiazide (PRINZIDE,ZESTORETIC) 10-12.5 MG per tablet, Take 1 tablet by mouth daily., Disp: 90 tablet, Rfl: 3 meloxicam (MOBIC) 15 MG tablet, Take 1 tablet (15 mg total) by mouth daily., Disp: 30 tablet, Rfl: 3;  montelukast (SINGULAIR) 10 MG tablet, Take 1 tablet (10 mg total) by mouth daily., Disp: 90 tablet, Rfl: 3;  Multiple Vitamin (MULTIVITAMIN) tablet, Take 1 tablet by mouth daily. , Disp: , Rfl: ;  omeprazole-sodium bicarbonate (ZEGERID) 40-1100 MG per capsule, Take 1 capsule by mouth daily. , Disp: , Rfl:  promethazine (PHENERGAN) 12.5 MG tablet, Take 1 tablet (12.5 mg total) by mouth every 8 (eight) hours as needed for nausea or vomiting., Disp: 20 tablet, Rfl: 0  EXAM:  Filed Vitals:   09/11/14 1459  BP: 118/78  Pulse: 93  Temp: 97.8 F (36.6 C)    Body mass index is 29.71 kg/(m^2).  GENERAL: vitals reviewed and listed above, alert, oriented, appears well hydrated and in no acute distress  HEENT: atraumatic, conjunttiva clear, no obvious abnormalities on inspection of external nose and ears  NECK: no obvious masses on inspection  LUNGS: clear to auscultation bilaterally, no wheezes, rales or rhonchi, good air movement  CV: HRRR, no peripheral edema  MS: moves all extremities without noticeable abnormality  PSYCH: pleasant and cooperative, no obvious depression or anxiety  ASSESSMENT AND  PLAN:  Discussed the following assessment and plan:  Menorrhagia with irregular cycle - Plan: Ambulatory referral to Gynecology  Anemia, iron deficiency - Plan: Ambulatory referral to Gynecology  Hyperlipemia  Essential hypertension  Asthma, mild intermittent, uncomplicated  Gastroesophageal reflux disease, esophagitis presence not specified  Low back pain, unspecified back pain laterality, with sciatica presence unspecified  Type 2 diabetes mellitus without complication - Plan: Ambulatory referral to diabetic education  IBS (irritable bowel syndrome)  -We reviewed the PMH, PSH, FH, SH, Meds and Allergies. -We provided refills for any medications we will prescribe as needed. -We addressed current concerns per orders and patient instructions. -We have asked for records for pertinent exams, studies, vaccines and notes from previous providers. -We have advised patient to follow up per instructions below. -offered vaccines -referral to gyn for irr  bleeding with menorrhagia and iron def anemia, she also is currently seeing GI for iron def anemia -advised with need labs here with hemoglobin a1c, lipids, bmp but she wants to do physical in the new year and do then -referral to nutrition per her request  -Patient advised to return or notify a doctor immediately if symptoms worsen or persist or new concerns arise.  Patient Instructions  BEFORE YOU LEAVE: -vitals -schedule physical exam in January  We recommend the following healthy lifestyle measures: - eat a healthy diet consisting of lots of vegetables, fruits, beans, nuts, seeds, healthy meats such as white chicken and fish and whole grains.  - avoid fried foods, fast food, processed foods, sodas, red meet and other fattening foods.  - get a least 150 minutes of aerobic exercise per week.   -We placed a referral for you as discussed to the gynecologist for the irregular bleeding. It usually takes about 1-2 weeks to process and  schedule this referral. If you have not heard from us regarding this appointment in 2 weeks please contact our office.       Kriste BasqueKIM, Daisja Kessinger R.

## 2014-09-11 NOTE — Addendum Note (Signed)
Addended by: Johnella MoloneyFUNDERBURK, Roseanne Juenger A on: 09/11/2014 03:23 PM   Modules accepted: Orders, Medications

## 2014-09-11 NOTE — Patient Instructions (Signed)
BEFORE YOU LEAVE: -vitals -schedule physical exam in January  We recommend the following healthy lifestyle measures: - eat a healthy diet consisting of lots of vegetables, fruits, beans, nuts, seeds, healthy meats such as white chicken and fish and whole grains.  - avoid fried foods, fast food, processed foods, sodas, red meet and other fattening foods.  - get a least 150 minutes of aerobic exercise per week.   -We placed a referral for you as discussed to the gynecologist for the irregular bleeding. It usually takes about 1-2 weeks to process and schedule this referral. If you have not heard from us regarding this appointment in 2 weeks please contact our office.

## 2014-09-12 ENCOUNTER — Telehealth: Payer: Self-pay | Admitting: Family Medicine

## 2014-09-12 NOTE — Telephone Encounter (Signed)
emmi emailed °

## 2014-09-21 ENCOUNTER — Other Ambulatory Visit: Payer: Self-pay | Admitting: Podiatry

## 2014-10-05 ENCOUNTER — Ambulatory Visit (INDEPENDENT_AMBULATORY_CARE_PROVIDER_SITE_OTHER): Payer: BC Managed Care – PPO

## 2014-10-05 ENCOUNTER — Encounter: Payer: Self-pay | Admitting: Podiatry

## 2014-10-05 ENCOUNTER — Ambulatory Visit (INDEPENDENT_AMBULATORY_CARE_PROVIDER_SITE_OTHER): Payer: BC Managed Care – PPO | Admitting: Podiatry

## 2014-10-05 VITALS — BP 155/102 | HR 86 | Resp 18

## 2014-10-05 DIAGNOSIS — M722 Plantar fascial fibromatosis: Secondary | ICD-10-CM

## 2014-10-05 DIAGNOSIS — R52 Pain, unspecified: Secondary | ICD-10-CM

## 2014-10-05 DIAGNOSIS — M7672 Peroneal tendinitis, left leg: Secondary | ICD-10-CM

## 2014-10-05 NOTE — Progress Notes (Signed)
   Subjective:    Patient ID: [name], female    DOB: 03-Feb-1966, 48 y.o.   MRN: [id number]  HPI  48 year old female presents the office today with complaints of continued pain to the outside aspect of her left foot. Patient call the office yesterday asking for a boot as she has been undergoing physical therapy and her physical therapist if his she most likely has tendinitis of the left foot. She states that she has previous a been treated for plantar fasciitis and was last seen by Dr. Al CorpusHyatt for peroneal tendon compensation. She states that after undergoing physical therapy she continues to have pain and swelling to the area. She states they have been doing steroid treatments, ice/heat, stretching to the area without much resolution of symptoms. She denies any recent injury or trauma to the area. Denies any systemic complaints such as fevers, chills, nausea, vomiting. No other complaints at this time in no acute changes since last appointment.    Review of Systems  All other systems reviewed and are negative.      Objective:   Physical Exam AAO x 3, NAD DP/PT pulses palpable b/l, CRT < 3 sec To sensation intact with Simms Weinstein monofilament, vibratory sensation intact, Achilles tendon reflex intact. Tenderness palpation along the course of the peroneal tendons and into the insertion of the fifth metatarsal base. There is mild pain with eversion. Peroneal tendons appear to be intact. There is no pain directly over the fifth metatarsal except for at the insertion at the fifth metatarsal base. Mild discomfort upon palpation to the plantar medial tubercle of the calcaneus at the insertion of the plantar fascia on the left foot. There is no pain with lateral compression of the calcaneus or pain with vibratory sensation. No pain on the posterior aspect of the calcaneus or along the course/insertion of the Achilles tendon. MMT 5/5, ROM WNL No open lesions or pre-ulcerative lesions No pain  with calf compression, swelling, warmth, erythema.     Assessment & Plan:  48 year old female with left foot peroneal tendinitis, plantar fasciitis -X-Rays were obtained and reviewed with the patient. -Treatment options were discussed including alternatives, risks, complications. -As the patient has been undergoing steroid treatments with physical therapy to the area will hold off on any steroid injection at this time. -Due to the inflammation and pain around the tendon will immobilize and a CAM walker to the acute phase. Once the inflammation decreases as well as her symptoms will return to physical therapy. -Follow-up in 3-4 weeks or sooner should any problems arise. In the meantime, encouraged call the office with any question's, concerns, change in symptoms.

## 2014-10-19 ENCOUNTER — Encounter: Payer: Self-pay | Admitting: *Deleted

## 2014-10-19 ENCOUNTER — Encounter: Payer: 59 | Attending: Family Medicine | Admitting: *Deleted

## 2014-10-19 VITALS — Ht 63.0 in | Wt 166.0 lb

## 2014-10-19 DIAGNOSIS — E119 Type 2 diabetes mellitus without complications: Secondary | ICD-10-CM | POA: Insufficient documentation

## 2014-10-19 NOTE — Patient Instructions (Signed)
Plan:  Identify your Carb Choices and choose at least 1 per meal and snack if hungry  Include protein in moderation with your meals and snacks Consider reading food labels for Total Carbohydrate of foods Continue checking BG at alternate times per day a few times a week Consider asking MD about other choices of medication that have less risk for low BG

## 2014-10-19 NOTE — Progress Notes (Signed)
  Medical Nutrition Therapy:  Appt start time: 1530 end time:  1700.  Assessment:  Primary concerns today: 10/19/14. History of DM 2 for about 2-3 years. She states she has hypoglycemia at least twice a week. She SMBG les than twice a week due to taking steroids and also with her anemia she bruises easily. She reports a range 69-130 mg/dl. She is limited in exercise options due to multiple medical problems. She works as Industrial/product designersubstitute teacher occasionally  Preferred Learning Style: all of the following  Auditory  Visual  Hands on  No preference indicated   Learning Readiness:   Ready  Change in progress  MEDICATIONS: see list, diabetes med: Amaryl   DIETARY INTAKE:  24-hr recall:  B ( AM): quick food like cereal with skim milk OR cinnamon toast with PNB OR Protein bar  Snk ( AM): usually Protein bar OR fresh fruit   L ( PM): eats out often due to errands: fish sandwich, usually small fries if any, water or diet soda Snk ( PM): same as AM OR cheese crackers OR fruit snack bar D ( PM): simple meals - spaghetti with salad OR soup and sandwich OR left overs from previous meal with salad. Usually lean meats Snk ( PM): cookies OR chocolate candy OR ice cream in small servings Beverages: water or diet soda, SF vanilla coffee  Usual physical activity: limited but has worked at [employer] and water exercises, Currently getting physical therapy   Estimated energy needs: 1400 calories 158 g carbohydrates 105 g protein 39 g fat  Progress Towards Goal(s):  In progress.   Nutritional Diagnosis:  NB-1.1 Food and nutrition-related knowledge deficit As related to Diabetes.  As evidenced by no previous Diabetes education.    Intervention:  Nutrition counseling and diabetes education initiated. Discussed Carb Counting by food group as method of portion control, reading food labels, and benefits of increased activity. . Plan:  Identify your Carb Choices and choose at least 1 per meal and snack if  hungry  Include protein in moderation with your meals and snacks Consider reading food labels for Total Carbohydrate of foods Continue checking BG at alternate times per day a few times a week Consider asking MD about other choices of medication that have less risk for low BG  Teaching Method Utilized: all of the following Visual Auditory Hands on  Handouts given during visit include: Living Well with Diabetes Carb Counting and Food Label handouts Meal Plan Card Diabetes Medication handout  APP Resource List  Barriers to learning/adherence to lifestyle change: disabled teenage son, multiple medical problems   Demonstrated degree of understanding via:  Teach Back   Monitoring/Evaluation:  Dietary intake, exercise, reading food labels, and body weight in 1 month(s).

## 2014-10-24 ENCOUNTER — Telehealth: Payer: Self-pay | Admitting: Family Medicine

## 2014-10-24 MED ORDER — MONTELUKAST SODIUM 10 MG PO TABS: 10.0000 mg | ORAL_TABLET | Freq: Every day | ORAL | Status: DC

## 2014-10-24 NOTE — Telephone Encounter (Signed)
Pt request refill montelukast (SINGULAIR) 10 MG tablet [facility]  Last filled by previous dr, dr Debby Budnorins. Need new rx

## 2014-10-24 NOTE — Telephone Encounter (Signed)
Rx done. 

## 2014-10-24 NOTE — Telephone Encounter (Signed)
Ok - #90 with 1 refil

## 2014-10-31 ENCOUNTER — Ambulatory Visit: Payer: BC Managed Care – PPO | Admitting: Podiatry

## 2014-11-07 ENCOUNTER — Ambulatory Visit: Payer: Self-pay | Admitting: Podiatry

## 2014-11-09 ENCOUNTER — Other Ambulatory Visit: Payer: Self-pay

## 2014-11-09 MED ORDER — BUTALBITAL-APAP-CAFFEINE 50-325-40 MG PO TABS: 1.0000 | ORAL_TABLET | Freq: Two times a day (BID) | ORAL | Status: DC | PRN

## 2014-11-09 NOTE — Telephone Encounter (Signed)
Rx request for Foricet 50-325-40 mg tablet-Take 1 tablet by mouth twice daily as needed #14  Pharm:  [facility]  Pls advise.

## 2014-11-16 ENCOUNTER — Ambulatory Visit (INDEPENDENT_AMBULATORY_CARE_PROVIDER_SITE_OTHER): Payer: 59 | Admitting: Podiatry

## 2014-11-16 ENCOUNTER — Encounter: Payer: Self-pay | Admitting: Podiatry

## 2014-11-16 VITALS — BP 131/89 | HR 86 | Resp 16

## 2014-11-16 DIAGNOSIS — M7672 Peroneal tendinitis, left leg: Secondary | ICD-10-CM | POA: Diagnosis not present

## 2014-11-16 DIAGNOSIS — M722 Plantar fascial fibromatosis: Secondary | ICD-10-CM

## 2014-11-18 NOTE — Progress Notes (Signed)
She presents today for follow-up of her plantar fasciitis and peroneal tendinitis of her left foot and ankle. She states that is just killing her is getting worse and worse as days go on she tried wearing the boot she says really just does not help at all she states that the pain is so severe that is starting to affect her daily activities and ability to perform her regular duties as well as continued exercise regimens.  Objective: Vital signs are stable she's alert and oriented 3 severe pain on palpation medially continue tubercle of the left heel. She also has pain on palpation of the peroneal tendons with soft tissue edema overlying the tendons with fluctuance within the tendon sheath. This is consistent with severe peroneal tendinitis or possibly even a tear of the peroneal tendons.  Assessment: Plantar fasciitis with lateral compensatory syndrome possibly associated with peroneal tendinitis or even tear.  Plan: At this point I am encouraging the patient to have an MRI performed to evaluate peroneal tendons for tear and the plantar fascia for possible surgical consideration.

## 2014-11-20 ENCOUNTER — Telehealth: Payer: Self-pay | Admitting: Family Medicine

## 2014-11-20 NOTE — Telephone Encounter (Signed)
Pt must cancel 8:15 am cpe in the morning,(due to weather) but cannot wait until next available which is May.  Pt states she needs to discuss med changes. Ok to work in sooner?

## 2014-11-21 ENCOUNTER — Encounter: Payer: Self-pay | Admitting: Family Medicine

## 2014-11-21 ENCOUNTER — Telehealth: Payer: Self-pay

## 2014-11-21 MED ORDER — LISINOPRIL-HYDROCHLOROTHIAZIDE 10-12.5 MG PO TABS: 1.0000 | ORAL_TABLET | Freq: Every day | ORAL | Status: DC

## 2014-11-21 NOTE — Telephone Encounter (Signed)
[facility] refill request for LISINOPRIL-HCTZ 10-12.5MG 

## 2014-11-21 NOTE — Telephone Encounter (Signed)
Ok - please do not use same day slots. Thanks.

## 2014-11-22 ENCOUNTER — Ambulatory Visit
Admission: RE | Admit: 2014-11-22 | Discharge: 2014-11-22 | Disposition: A | Discharge status: Home / Self Care | Payer: 59 | Source: Ambulatory Visit | Attending: Podiatry | Admitting: Podiatry

## 2014-11-22 DIAGNOSIS — M722 Plantar fascial fibromatosis: Secondary | ICD-10-CM

## 2014-11-22 DIAGNOSIS — M7672 Peroneal tendinitis, left leg: Secondary | ICD-10-CM

## 2014-11-23 ENCOUNTER — Ambulatory Visit: Payer: Self-pay | Admitting: *Deleted

## 2014-11-23 NOTE — Telephone Encounter (Signed)
done

## 2014-11-25 ENCOUNTER — Other Ambulatory Visit: Payer: Self-pay | Admitting: Family Medicine

## 2014-11-30 ENCOUNTER — Telehealth: Payer: Self-pay | Admitting: *Deleted

## 2014-11-30 NOTE — Telephone Encounter (Signed)
atorvastatin (LIPITOR) 40 MG tablet  Medication   Date: 12/02/2013  Department[facility]  Ordering/Authorizing: Jacques NavyMichael E Norins, MD      Order Providers    Prescribing Provider Encounter Provider   Jacques NavyMichael E Norins, MD Jacques NavyMichael E Norins, MD    Medication Detail      Disp Refills Start End     atorvastatin (LIPITOR) 40 MG tablet 90 tablet 3 12/02/2013     Sig - Route: Take 1 tablet (40 mg total) by mouth daily. - Oral    E-Prescribing Status: Receipt confirmed by pharmacy (12/02/2013 1:54 PM EST)     Associated Diagnoses    HYPERLIPIDEMIA - Primary       Pharmacy    [id number] - [place], [place] - [address]        Additional Information    Associated Reports   View Encounter   Priority and Order Details

## 2014-11-30 NOTE — Telephone Encounter (Signed)
I called and requested MRI disk be sent to us.  [facility] to send [facility] to [facility] for a re-read per Dr. Al CorpusHyatt.  I called and informed patient that Dr. Al CorpusHyatt wanted to send the MRI to be re-read by another Radiologist that specializes in lower extremity readings only.  He just wanted to make you aware of the delay in getting the results.  We will give you a call once we get the final results.  "Okay, thanks for calling.

## 2014-12-05 ENCOUNTER — Telehealth: Payer: Self-pay

## 2014-12-05 MED ORDER — ATORVASTATIN CALCIUM 40 MG PO TABS: 40.0000 mg | ORAL_TABLET | Freq: Every day | ORAL | Status: DC

## 2014-12-05 NOTE — Telephone Encounter (Signed)
MRI disk was mail to Dr. Stephani Policeallas Smith for a re-read.

## 2014-12-05 NOTE — Telephone Encounter (Signed)
[facility] refill request for LIPITOR 40MG 

## 2014-12-11 ENCOUNTER — Telehealth: Payer: Self-pay | Admitting: *Deleted

## 2014-12-11 NOTE — Telephone Encounter (Signed)
"  I need to schedule an appointment for MRI results.  I want to be sure that results are there first before scheduling appointment to see doctor.  I returned her call.  I informed her that the results are here.   "I know they are there because I brought the disk over.  I'm talking about the 3rd party results."  Yes, that is what I'm calling about.  We had already ordered the MRI disk prior to you bringing it over and had sent it to [facility].  Your [facility] results are here.  Would you like to schedule an appointment?  "It depends on my schedule, it's hard for me to get in there on Tuesday and Thursday."  Okay, let me send you to a scheduler.

## 2014-12-15 ENCOUNTER — Encounter: Payer: Self-pay | Admitting: Family Medicine

## 2014-12-15 ENCOUNTER — Ambulatory Visit (INDEPENDENT_AMBULATORY_CARE_PROVIDER_SITE_OTHER): Payer: 59 | Admitting: Family Medicine

## 2014-12-15 VITALS — BP 124/84 | HR 90 | Temp 97.8°F | Ht 63.0 in | Wt 171.6 lb

## 2014-12-15 DIAGNOSIS — J4520 Mild intermittent asthma, uncomplicated: Secondary | ICD-10-CM

## 2014-12-15 DIAGNOSIS — J309 Allergic rhinitis, unspecified: Secondary | ICD-10-CM

## 2014-12-15 DIAGNOSIS — E785 Hyperlipidemia, unspecified: Secondary | ICD-10-CM

## 2014-12-15 DIAGNOSIS — I10 Essential (primary) hypertension: Secondary | ICD-10-CM

## 2014-12-15 DIAGNOSIS — K219 Gastro-esophageal reflux disease without esophagitis: Secondary | ICD-10-CM

## 2014-12-15 DIAGNOSIS — M545 Low back pain: Secondary | ICD-10-CM

## 2014-12-15 DIAGNOSIS — Z0000 Encounter for general adult medical examination without abnormal findings: Secondary | ICD-10-CM

## 2014-12-15 DIAGNOSIS — F411 Generalized anxiety disorder: Secondary | ICD-10-CM

## 2014-12-15 DIAGNOSIS — E119 Type 2 diabetes mellitus without complications: Secondary | ICD-10-CM

## 2014-12-15 LAB — BASIC METABOLIC PANEL
BUN: 24 mg/dL — ABNORMAL HIGH (ref 6–23)
CALCIUM: 10 mg/dL (ref 8.4–10.5)
CO2: 32 mEq/L (ref 19–32)
Chloride: 102 mEq/L (ref 96–112)
Creatinine, Ser: 0.92 mg/dL (ref 0.40–1.20)
GFR: 69.23 mL/min (ref >60.00)
GLUCOSE: 96 mg/dL (ref 70–99)
POTASSIUM: 4.2 meq/L (ref 3.5–5.1)
Sodium: 139 mEq/L (ref 135–145)

## 2014-12-15 LAB — LIPID PANEL
CHOLESTEROL: 182 mg/dL (ref 0–200)
HDL: 54.6 mg/dL (ref >39.00)
LDL CALC: 99 mg/dL (ref 0–99)
NonHDL: 127.4
Total CHOL/HDL Ratio: 3
Triglycerides: 141 mg/dL (ref 0.0–149.0)
VLDL: 28.2 mg/dL (ref 0.0–40.0)

## 2014-12-15 LAB — HEMOGLOBIN A1C: Hgb A1c MFr Bld: 6.5 % (ref 4.6–6.5)

## 2014-12-15 MED ORDER — SAXAGLIPTIN HCL 2.5 MG PO TABS: 2.5000 mg | ORAL_TABLET | Freq: Every day | ORAL | Status: DC

## 2014-12-15 NOTE — Patient Instructions (Addendum)
Get your eye exam  Stop the amaryl  Start the Onglyza once daily  Schedule follow up with you diabetes educator  Schedule follow up in 3 months  We recommend the following healthy lifestyle measures: - eat a healthy diet consisting of lots of vegetables, fruits, beans, nuts, seeds, healthy meats such as white chicken and fish and whole grains.  - avoid fried foods, fast food, processed foods, sodas, red meet and other fattening foods.  - get a least 150 minutes of aerobic exercise per week.

## 2014-12-15 NOTE — Progress Notes (Signed)
Pre visit review using our clinic review tool, if applicable. No additional management support is needed unless otherwise documented below in the visit note. 

## 2014-12-15 NOTE — Progress Notes (Signed)
HPI:  Here for CPE: HM: -pneumococcal - doing today -HIV declined -influenza - declined -pap smear - did at gyn  QM: -asthma -diabetic foot exam -Concerns and/or follow up today:   DM: -meds:amaryl, acei -not on metformin-  reports made her have nausea. -now does not like the amaryl - reports causes low BS (in the 60s) when she does not eat - wants to do onglyza after talking with diabetes educator -diet and exercise: regular exercise - exercise video every other day, diet is so so -sees podiatrist -last eye exam: denies diabetic retinopathy -denies: polyuria, polydipsia,wounds, hypoglycemia - occ feels a little shaky and sweaty   HTN: -meds: lisinopril-hctz 10-12.5 -denies: CP, SOB, DOE, swelling  HLD -meds: lipitor -denies: leg cramps, cog changes  IBS/GERD: -managed by GI -meds: on zegerid and phenergan, bentyl and integra  Allergies/Mild Asthma: -meds: alb prn, zyrtec D, singulair -denies: SOB, wheezing  Migraines: -uses fioricet prn  Iron def anemia: -heavy menstrual periods that are irr, last period 10 days and heavy in October and several episodes of bleeding in October -seeing GI and gyn for work up  Hx DDD and spinal fusion surgery -[facility] - sees Dr. Wynetta Emeryram in [facility] for [place] back pain and radiation of pain - reports needs referral every year to follow up -doing PT -just had MRI and xrays -takes meloxicam - tries to limit this  -Diet: variety of foods, balance and well rounded, larger portion sizes  -Exercise: no regular exercise  -Taking folic acid, vitamin D or calcium: no  -Diabetes and Dyslipidemia Screening: done today FASTING  -Hx of HTN: no  -Vaccines: UTD  -pap history: done  -FDLMP: regular now, seeing gyn  -sexual activity: yes, female partner, no new partners  -wants STI testing: no  -FH breast, colon or ovarian ca: see FH Last mammogram: mammogram Last colon cancer screening: n/a Does breast health   -Alcohol, Tobacco, drug  use: see social history  Review of Systems - no fevers, unintentional weight loss, vision loss, hearing loss, chest pain, sob, hemoptysis, melena, hematochezia, hematuria, genital discharge, changing or concerning skin lesions, bleeding, bruising, loc, thoughts of self harm or SI  Past Medical History  Diagnosis Date  . ALLERGIC RHINITIS 10/08/2007  . ASTHMA 04/22/2007  . GERD 04/22/2007  . Headache(784.0) 10/08/2007  . HYPERTENSION 04/22/2007  . HYPERLIPIDEMIA 04/22/2007  . HYPERTENSION 04/22/2007  . LOW BACK PAIN 06/27/2008  . LUQ PAIN 09/06/2010  . NAUSEA 09/06/2010  . IBS (irritable bowel syndrome)     constipation predominant  . PONV (postoperative nausea and vomiting)   . Diabetes mellitus without complication   . Arthritis     Past Surgical History  Procedure Laterality Date  . Knee surgery  92, 96, 01, 08    right  . Knee surgery  88    left  . Nasal polyp surgery  95    sinus  . Lumbar fusion  05    L4-5 S1  . Tubal ligation    . Cesarean section    . Knee arthroplasty Right 04/29/2013    Procedure: COMPUTER ASSISTED TOTAL KNEE ARTHROPLASTY;  Surgeon: Eldred MangesMark C Yates, MD;  Location: [facility] OR;  Service: Orthopedics;  Laterality: Right;  Right Total Knee Arthroplasty, Cemented    Family History  Problem Relation Age of Onset  . Hypertension Mother   . Arthritis Mother     OA s/p bilateral hip replacement  . Diabetes Father   . Heart disease Father     CAD  .  Hyperlipidemia Father   . Hypertension Father   . Pulmonary fibrosis Father   . Osteopenia Sister     older with GI problems s/p colectomy  . Cancer Maternal Grandmother     Ovarian Cancer  . Osteopenia Sister     History   Social History  . Marital Status: Married    Spouse Name: N/A  . Number of Children: 2  . Years of Education: 16   Occupational History  . substitute teacher [employer]   Social History Main Topics  . Smoking status: Never Smoker   . Smokeless tobacco: Never Used  .  Alcohol Use: No  . Drug Use: No  . Sexual Activity:    Partners: Male   Other Topics Concern  . 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  albuterol (PROVENTIL HFA;VENTOLIN HFA) 108 (90 BASE) MCG/ACT inhaler, Inhale 2 puffs into the lungs 4 (four) times daily as needed for wheezing or shortness of breath., Disp: 18 g, Rfl: 11 .  atorvastatin (LIPITOR) 40 MG tablet, Take 1 tablet (40 mg total) by mouth daily., Disp: 30 tablet, Rfl: 2 .  butalbital-acetaminophen-caffeine (FIORICET, ESGIC) 50-325-40 MG per tablet, Take 1 tablet by mouth 2 (two) times daily as needed., Disp: 14 tablet, Rfl: 1 .  cetirizine-pseudoephedrine (ZYRTEC-D) 5-120 MG per tablet, Take 1 tablet by mouth daily as needed for allergies., Disp: 30 tablet, Rfl: 11 .  dicyclomine (BENTYL) 10 MG capsule, Take 1 tab at bedtime., Disp: 30 capsule, Rfl: 8 .  FeFum-FePoly-FA-B Cmp-C-Biot (INTEGRA PLUS) CAPS, Take one tablet every day, Disp: 30 capsule, Rfl: 2 .  fluocinonide cream (LIDEX) 0.05 %, Apply to affected area  twice daily, Disp: 60 g, Rfl: 1 .  lisinopril-hydrochlorothiazide (PRINZIDE,ZESTORETIC) 10-12.5 MG per tablet, Take 1 tablet by mouth daily., Disp: 90 tablet, Rfl: 3 .  meloxicam (MOBIC) 15 MG tablet, TAKE 1 TABLET (15 MG TOTAL) BY MOUTH DAILY., Disp: 30 tablet, Rfl: 2 .  montelukast (SINGULAIR) 10 MG tablet, Take 1 tablet (10 mg total) by mouth daily., Disp: 90 tablet, Rfl: 1 .  Multiple Vitamin (MULTIVITAMIN) tablet, Take 1 tablet by mouth daily. , Disp: , Rfl:  .  omeprazole-sodium bicarbonate (ZEGERID) 40-1100 MG per capsule, Take 1 capsule by mouth daily. , Disp: , Rfl:  .  promethazine (PHENERGAN) 12.5 MG tablet, Take 1 tablet (12.5 mg total) by mouth every 8  (eight) hours as needed for nausea or vomiting., Disp: 20 tablet, Rfl: 0 .  saxagliptin HCl (ONGLYZA) 2.5 MG TABS tablet, Take 1 tablet (2.5 mg total) by mouth daily., Disp: 30 tablet, Rfl: 3  EXAM:  Filed Vitals:   12/15/14 0857  BP: 124/84  Pulse: 90  Temp: 97.8 F (36.6 C)    GENERAL: vitals reviewed and listed below, alert, oriented, appears well hydrated and in no acute distress  HEENT: head atraumatic, PERRLA, normal appearance of eyes, ears, nose and mouth. moist mucus membranes.  NECK: supple, no masses or lymphadenopathy  LUNGS: clear to auscultation bilaterally, no rales, rhonchi or wheeze  CV: HRRR, no peripheral edema or cyanosis, normal pedal pulses  BREAST: declined -  does with gyn  GU: declined - does with gyn  RECTAL: declined  SKIN: no rash or abnormal lesions  MS: normal gait, moves all extremities normally  NEURO: CN II-XII grossly intact, normal muscle strength and sensation to light touch on extremities  PSYCH: normal affect, pleasant and cooperative  ASSESSMENT AND PLAN:  Discussed the following assessment and plan:  NOTE: > 30 minutes spent with this pt with > 50% of the time in counseling. Sig extra time besides her physical was spent addressing her hypoglycemia - a serious issues and a change in medication was made.  Hyperlipemia - Plan: Lipid Panel  Essential hypertension - Plan: Basic metabolic panel  Type 2 diabetes mellitus without complication - Plan: Hemoglobin A1c, saxagliptin HCl (ONGLYZA) 2.5 MG TABS tablet  Low back pain, unspecified back pain laterality, with sciatica presence unspecified - Plan: Ambulatory referral to Neurosurgery  Asthma, mild intermittent, uncomplicated  Allergic rhinitis, unspecified allergic rhinitis type  Gastroesophageal reflux disease, esophagitis presence not specified  Anxiety state  -Discussed and advised all [place] preventive services health task force level A and B recommendations for age, sex and  risks.  -Advised at least 150 minutes of exercise per week and a healthy diet low in saturated fats and sweets and consisting of fresh fruits and vegetables, lean meats such as fish and white chicken and whole grains.  -FASTING labs, studies and vaccines per orders this encounter  Orders Placed This Encounter  Procedures  . Hemoglobin A1c  . Lipid Panel  . Basic metabolic panel  . Ambulatory referral to Neurosurgery    Referral Priority:  Routine    Referral Type:  Surgical    Referral Reason:  Specialty Services Required    Requested Specialty:  Neurosurgery    Number of Visits Requested:  1    Patient advised to return to clinic immediately if symptoms worsen or persist or new concerns.  Patient Instructions  Get your eye exam  Stop the amaryl  Start the Onglyza once daily  Schedule follow up with you diabetes educator  Schedule follow up in 3 months  We recommend the following healthy lifestyle measures: - eat a healthy diet consisting of lots of vegetables, fruits, beans, nuts, seeds, healthy meats such as white chicken and fish and whole grains.  - avoid fried foods, fast food, processed foods, sodas, red meet and other fattening foods.  - get a least 150 minutes of aerobic exercise per week.      No Follow-up on file.  Kriste Basque R.

## 2014-12-19 ENCOUNTER — Encounter: Payer: Self-pay | Admitting: Podiatry

## 2014-12-19 ENCOUNTER — Ambulatory Visit (INDEPENDENT_AMBULATORY_CARE_PROVIDER_SITE_OTHER): Payer: 59 | Admitting: Podiatry

## 2014-12-19 DIAGNOSIS — M7672 Peroneal tendinitis, left leg: Secondary | ICD-10-CM

## 2014-12-19 DIAGNOSIS — M722 Plantar fascial fibromatosis: Secondary | ICD-10-CM | POA: Diagnosis not present

## 2014-12-19 NOTE — Progress Notes (Signed)
She presents today for follow-up of her MRI which did demonstrate plantar fasciitis left and peroneal tendinitis.  Objective: Vital signs are stable she's alert 3 she still has pain on palpation medial calcaneal tubercle of her left heel she still has fluctuance along the lateral aspect of her left ankle extending beneath the lateral malleolus. I still suspect a tear in this area because this severity of her symptomatology and the MRI states only tendinitis. I reviewed her past medical history medications allergies and surgeries and nothing has changed.  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 Peroneal tendinitis Rule out a tear left ankle.  Plan: At this point we consented her for endoscopic plantar fasciotomy of her left foot. As well as consenting her for a visualization of her peroneal tendons. She understands that she very well may need a cast afterwards. We went over the consent form line by line and another number giving her ample time to ask questions she saw fit regarding these 2 procedures I answered them to the best of my ability and ligaments terms she understood this and was amenable to it. She signed all 3 pages of the consent form and I will follow-up with her in the near future for surgical intervention.

## 2014-12-19 NOTE — Patient Instructions (Signed)
Pre-Operative Instructions  Congratulations, you have decided to take an important step to improving your quality of life.  You can be assured that the doctors of [facility] will be with you every step of the way.  1. Plan to be at the surgery center/hospital at least 1 (one) hour prior to your scheduled time unless otherwise directed by the surgical center/hospital staff.  You must have a responsible adult accompany you, remain during the surgery and drive you home.  Make sure you have directions to the surgical center/hospital and know how to get there on time. 2. For hospital based surgery you will need to obtain a history and physical form from your family physician within 1 month prior to the date of surgery- we will give you a form for you primary physician.  3. We make every effort to accommodate the date you request for surgery.  There are however, times where surgery dates or times have to be moved.  We will contact you as soon as possible if a change in schedule is required.   4. No Aspirin/Ibuprofen for one week before surgery.  If you are on aspirin, any non-steroidal anti-inflammatory medications (Mobic, Aleve, Ibuprofen) you should stop taking it 7 days prior to your surgery.  You make take Tylenol  For pain prior to surgery.  5. Medications- If you are taking daily heart and blood pressure medications, seizure, reflux, allergy, asthma, anxiety, pain or diabetes medications, make sure the surgery center/hospital is aware before the day of surgery so they may notify you which medications to take or avoid the day of surgery. 6. No food or drink after midnight the night before surgery unless directed otherwise by surgical center/hospital staff. 7. No alcoholic beverages 24 hours prior to surgery.  No smoking 24 hours prior to or 24 hours after surgery. 8. Wear loose pants or shorts- loose enough to fit over bandages, boots, and casts. 9. No slip on shoes, sneakers are best. 10. Bring  your boot with you to the surgery center/hospital.  Also bring crutches or a walker if your physician has prescribed it for you.  If you do not have this equipment, it will be provided for you after surgery. 11. If you have not been contracted by the surgery center/hospital by the day before your surgery, call to confirm the date and time of your surgery. 12. Leave-time from work may vary depending on the type of surgery you have.  Appropriate arrangements should be made prior to surgery with your employer. 13. Prescriptions will be provided immediately following surgery by your doctor.  Have these filled as soon as possible after surgery and take the medication as directed. 14. Remove nail polish on the operative foot. 15. Wash the night before surgery.  The night before surgery wash the foot and leg well with the antibacterial soap provided and water paying special attention to beneath the toenails and in between the toes.  Rinse thoroughly with water and dry well with a towel.  Perform this wash unless told not to do so by your physician.  Enclosed: 1 Ice pack (please put in freezer the night before surgery)   1 Hibiclens skin cleaner   Pre-op Instructions  If you have any questions regarding the instructions, do not hesitate to call our office.  [place]: [address] [place], [place] [address] [phone]  [place]: [address] [place], [place] [address] [phone]  [place]: [address]  [place], [place] [address] [phone]  Dr. Richard   Tuchman DPM, Dr. Norman Regal DPM Dr. Richard Sikora DPM, Dr. M. Todd Hyatt DPM, Dr. Kathryn Egerton DPM 

## 2014-12-26 ENCOUNTER — Other Ambulatory Visit: Payer: Self-pay | Admitting: *Deleted

## 2014-12-26 ENCOUNTER — Other Ambulatory Visit: Payer: Self-pay

## 2014-12-26 ENCOUNTER — Telehealth: Payer: Self-pay | Admitting: Podiatry

## 2014-12-26 DIAGNOSIS — R109 Unspecified abdominal pain: Secondary | ICD-10-CM | POA: Insufficient documentation

## 2014-12-26 MED ORDER — MELOXICAM 15 MG PO TABS: ORAL_TABLET | ORAL | Status: DC

## 2014-12-26 NOTE — Telephone Encounter (Signed)
Rx request for Fluocinonide External Cream 0.05%-Apply to affected area twice a day qty: 60  Pharm:  [facility]

## 2014-12-26 NOTE — Telephone Encounter (Signed)
I called and left her a message that prescription was sent to her pharmacy.

## 2014-12-26 NOTE — Telephone Encounter (Signed)
Patient is requesting Meloxicam 15 mg tab be refilled at new pharmacy. New pharmacy is [facility]. Phone # [phone].

## 2014-12-27 MED ORDER — FLUOCINONIDE 0.05 % EX CREA: TOPICAL_CREAM | CUTANEOUS | Status: DC

## 2014-12-28 ENCOUNTER — Other Ambulatory Visit: Payer: Self-pay | Admitting: Podiatry

## 2015-01-01 LAB — HM DIABETES EYE EXAM

## 2015-01-04 ENCOUNTER — Other Ambulatory Visit: Payer: Self-pay | Admitting: Neurosurgery

## 2015-01-04 DIAGNOSIS — R109 Unspecified abdominal pain: Secondary | ICD-10-CM

## 2015-01-09 ENCOUNTER — Telehealth: Payer: Self-pay | Admitting: Family Medicine

## 2015-01-09 DIAGNOSIS — E119 Type 2 diabetes mellitus without complications: Secondary | ICD-10-CM

## 2015-01-09 MED ORDER — SAXAGLIPTIN HCL 2.5 MG PO TABS: 2.5000 mg | ORAL_TABLET | Freq: Every day | ORAL | Status: DC

## 2015-01-09 NOTE — Telephone Encounter (Signed)
Pt needs refill on onglyz 2.5 mg #60 [facility] to [facility] [address]

## 2015-01-09 NOTE — Telephone Encounter (Signed)
Rx done. 

## 2015-01-11 ENCOUNTER — Encounter: Payer: Self-pay | Admitting: Family Medicine

## 2015-01-23 ENCOUNTER — Ambulatory Visit
Admission: RE | Admit: 2015-01-23 | Discharge: 2015-01-23 | Disposition: A | Discharge status: Home / Self Care | Payer: 59 | Source: Ambulatory Visit | Attending: Neurosurgery | Admitting: Neurosurgery

## 2015-01-23 DIAGNOSIS — R109 Unspecified abdominal pain: Secondary | ICD-10-CM

## 2015-01-23 MED ORDER — IOPAMIDOL (ISOVUE-300) INJECTION 61%
100.0000 mL | Freq: Once | INTRAVENOUS | Status: AC | PRN
  Administered 2015-01-23: 100 mL via INTRAVENOUS

## 2015-01-25 ENCOUNTER — Other Ambulatory Visit: Payer: Self-pay | Admitting: Podiatry

## 2015-01-25 MED ORDER — PROMETHAZINE HCL 25 MG PO TABS: 25.0000 mg | ORAL_TABLET | Freq: Three times a day (TID) | ORAL | Status: DC | PRN

## 2015-01-25 MED ORDER — OXYCODONE-ACETAMINOPHEN 10-325 MG PO TABS: ORAL_TABLET | ORAL | Status: DC

## 2015-01-25 MED ORDER — CLINDAMYCIN HCL 150 MG PO CAPS: 150.0000 mg | ORAL_CAPSULE | Freq: Three times a day (TID) | ORAL | Status: DC

## 2015-01-26 ENCOUNTER — Encounter: Payer: Self-pay | Admitting: Podiatry

## 2015-01-26 DIAGNOSIS — M7672 Peroneal tendinitis, left leg: Secondary | ICD-10-CM | POA: Diagnosis not present

## 2015-01-26 DIAGNOSIS — M722 Plantar fascial fibromatosis: Secondary | ICD-10-CM | POA: Diagnosis not present

## 2015-01-30 ENCOUNTER — Telehealth: Payer: Self-pay | Admitting: *Deleted

## 2015-01-30 MED ORDER — DOXYCYCLINE HYCLATE 100 MG PO TABS: 100.0000 mg | ORAL_TABLET | Freq: Two times a day (BID) | ORAL | Status: DC

## 2015-01-30 NOTE — Telephone Encounter (Addendum)
Pt called complains the 2 medications given after surgery are too strong.  Left message [phone] and [phone] to call again with concerns.  Pt states the Clindamycin is nauseating and would like a change in medication.  Dr. Al CorpusHyatt states stop the Clindamycin and begin Doxycycline 100mg  # 10 one capsule bid. I informed the pt of the orders and [facility]

## 2015-02-01 ENCOUNTER — Ambulatory Visit (INDEPENDENT_AMBULATORY_CARE_PROVIDER_SITE_OTHER): Payer: 59 | Admitting: Podiatry

## 2015-02-01 ENCOUNTER — Encounter: Payer: Self-pay | Admitting: Podiatry

## 2015-02-01 VITALS — BP 131/96 | HR 105 | Resp 16

## 2015-02-01 DIAGNOSIS — M722 Plantar fascial fibromatosis: Secondary | ICD-10-CM

## 2015-02-01 DIAGNOSIS — M7672 Peroneal tendinitis, left leg: Secondary | ICD-10-CM

## 2015-02-01 DIAGNOSIS — Z9889 Other specified postprocedural states: Secondary | ICD-10-CM

## 2015-02-01 NOTE — Progress Notes (Signed)
Presents today for follow-up of her peroneal tendon repair as well as her endoscopic plantar fasciotomy of her left foot. She presents today denying fever chills nausea vomiting muscle aches and pains.  Objective: Vital signs are stable alert and oriented 3. Cast is intact toes are freely movable and she has good sensation to the toes. She denies shortness of breath or chest pain.  Assessment: Well-healing surgical foot left.  Plan: We will remove her cast in 1 week.

## 2015-02-07 ENCOUNTER — Telehealth: Payer: Self-pay | Admitting: *Deleted

## 2015-02-07 NOTE — Telephone Encounter (Signed)
Patient states she switched from Amaryl to Onglyza due to her problem with hypoglycemia with any delay in her meals when on Amaryl. She stated that she did better with her BG with Onglyza but developed flu like symptoms and stopped taking it about 2 weeks ago. She wanted a suggestion of any other DM medications that she might be a candidate for. I reviewed the medications on our Non-insulin Medication Handout and suggested she discuss further with her MD.

## 2015-02-07 NOTE — Telephone Encounter (Signed)
Needs appt

## 2015-02-08 ENCOUNTER — Ambulatory Visit (INDEPENDENT_AMBULATORY_CARE_PROVIDER_SITE_OTHER): Payer: 59 | Admitting: Podiatry

## 2015-02-08 DIAGNOSIS — Z9889 Other specified postprocedural states: Secondary | ICD-10-CM

## 2015-02-08 NOTE — Progress Notes (Signed)
She presents today 2 weeks status post peroneal tendon repair and endoscopic plantar fasciotomy of the left foot. She denies fever chills nausea vomiting muscle aches and pains states that the cast is annoying.  Objective: Vital signs are stable alert and oriented 3. Dry sterile dressing was removed once the cast was removed. Demonstrating mild edema to the left foot but no erythema saline as drainage or odor. Sutures were removed from the portal sites left foot. At every other staple was removed to the left lateral ankle.  Assessment: Well-healing surgical foot left.  Plan: At this point I'll redress today with a dry sterile compressive dressing and placed her back in to a below-knee cast. She will remain in this cast for the next 2 weeks I will follow-up with her at that time for cast removal and suture removal.

## 2015-02-08 NOTE — Telephone Encounter (Signed)
I called the pt and informed her of the message below and she stated she cannot come in now since she has had foot surgery and stated she will call back for an appt.

## 2015-02-13 NOTE — Progress Notes (Signed)
DOS 01/26/2015 EPF left, repair peroneal tendon left

## 2015-02-14 ENCOUNTER — Telehealth: Payer: Self-pay | Admitting: *Deleted

## 2015-02-14 ENCOUNTER — Telehealth: Payer: Self-pay | Admitting: Family Medicine

## 2015-02-14 DIAGNOSIS — E119 Type 2 diabetes mellitus without complications: Secondary | ICD-10-CM

## 2015-02-14 MED ORDER — SAXAGLIPTIN HCL 2.5 MG PO TABS: 2.5000 mg | ORAL_TABLET | Freq: Every day | ORAL | Status: DC

## 2015-02-14 NOTE — Telephone Encounter (Signed)
Please send glimepiride (AMARYL) 2 MG tablet  And [facility]  Pt has appt 6/10 .  Pt has a cast on her foot and cannot come in before that.

## 2015-02-14 NOTE — Telephone Encounter (Signed)
Pt is requesting glimepiride.  Can you change?  See message below

## 2015-02-14 NOTE — Telephone Encounter (Signed)
Pt needs refill on glimepiride 2 mg #90 [facility] to [facility] [employer]. Pt can not take onglyza due to making her sick. Pt is out of glimepiride 2 mg

## 2015-02-14 NOTE — Addendum Note (Signed)
Addended by: Alfred LevinsWYRICK, CINDY D on: 02/14/2015 01:53 PM   Modules accepted: Orders

## 2015-02-14 NOTE — Telephone Encounter (Signed)
Amaryl was d/c by Dr Selena BattenKim on 12/15/14.  Pt states that Glimepiride 2 mg once daily has been working for her and pharmacy is requesting a refill on it.  [facility]

## 2015-02-14 NOTE — Telephone Encounter (Signed)
Duplicate message. 

## 2015-02-14 NOTE — Telephone Encounter (Signed)
The medication is onglyza 2.5 I believe? Please refill if this is the case. 3 months with 1 refill. Thank you.

## 2015-02-15 MED ORDER — GLIMEPIRIDE 2 MG PO TABS: 2.0000 mg | ORAL_TABLET | Freq: Every day | ORAL | Status: DC

## 2015-02-15 NOTE — Telephone Encounter (Signed)
She told me that the amaryl gives her low blood sugars if she misses meals. Advise ok to restart but has to eat regular meals on this medication. I sent 30 days with one refill.

## 2015-02-15 NOTE — Telephone Encounter (Signed)
Patient informed. 

## 2015-02-16 ENCOUNTER — Other Ambulatory Visit: Payer: Self-pay | Admitting: *Deleted

## 2015-02-16 MED ORDER — GLIMEPIRIDE 2 MG PO TABS: 2.0000 mg | ORAL_TABLET | Freq: Every day | ORAL | Status: DC

## 2015-02-16 NOTE — Telephone Encounter (Signed)
Pharmacy correction

## 2015-02-19 ENCOUNTER — Encounter: Payer: Self-pay | Admitting: Family Medicine

## 2015-02-19 ENCOUNTER — Ambulatory Visit (INDEPENDENT_AMBULATORY_CARE_PROVIDER_SITE_OTHER): Payer: 59 | Admitting: Family Medicine

## 2015-02-19 VITALS — BP 118/88 | HR 106 | Temp 97.6°F | Ht 63.0 in

## 2015-02-19 DIAGNOSIS — J4520 Mild intermittent asthma, uncomplicated: Secondary | ICD-10-CM

## 2015-02-19 DIAGNOSIS — J069 Acute upper respiratory infection, unspecified: Secondary | ICD-10-CM | POA: Diagnosis not present

## 2015-02-19 MED ORDER — FLUTICASONE PROPIONATE HFA 44 MCG/ACT IN AERO: 2.0000 | INHALATION_SPRAY | Freq: Two times a day (BID) | RESPIRATORY_TRACT | Status: DC

## 2015-02-19 MED ORDER — AZITHROMYCIN 250 MG PO TABS: ORAL_TABLET | ORAL | Status: DC

## 2015-02-19 NOTE — Progress Notes (Signed)
HPI:  URI: -started: 3 days ago -symptoms:nasal congestion, sore throat, cough, HA, had to use alb a few times but not having any SOB, wheezing or asthma symptoms today, thick mucus -denies:fever, SOB, NVD, tooth pain -has tried: vics vapor rub, zyrtec D -sick contacts/travel/risks: son is sick also, denies flu exposure, tick exposure or or Ebola risks -Hx of: allergies and asthma  ROS: See pertinent positives and negatives per HPI.  Past Medical History  Diagnosis Date  . ALLERGIC RHINITIS 10/08/2007  . ASTHMA 04/22/2007  . GERD 04/22/2007  . Headache(784.0) 10/08/2007  . HYPERTENSION 04/22/2007  . HYPERLIPIDEMIA 04/22/2007  . LOW BACK PAIN 06/27/2008  . LUQ PAIN 09/06/2010  . NAUSEA 09/06/2010  . IBS (irritable bowel syndrome)     constipation predominant  . PONV (postoperative nausea and vomiting)   . Diabetes mellitus without complication   . Arthritis     Past Surgical History  Procedure Laterality Date  . Knee surgery  92, 96, 01, 08    right  . Knee surgery  88    left  . Nasal polyp surgery  95    sinus  . Lumbar fusion  05    L4-5 S1  . Tubal ligation    . Cesarean section    . Knee arthroplasty Right 04/29/2013    Procedure: COMPUTER ASSISTED TOTAL KNEE ARTHROPLASTY;  Surgeon: Eldred Manges, MD;  Location: [facility] OR;  Service: Orthopedics;  Laterality: Right;  Right Total Knee Arthroplasty, Cemented    Family History  Problem Relation Age of Onset  . Hypertension Mother   . Arthritis Mother     OA s/p bilateral hip replacement  . Diabetes Father   . Heart disease Father     CAD  . Hyperlipidemia Father   . Hypertension Father   . Pulmonary fibrosis Father   . Osteopenia Sister     older with GI problems s/p colectomy  . Cancer Maternal Grandmother     Ovarian Cancer  . Osteopenia Sister     History   Social History  . Marital Status: Married    Spouse Name: N/A  . Number of Children: 2  . Years of Education: 16   Occupational History  . substitute  teacher [employer]   Social History Main Topics  . Smoking status: Never Smoker   . Smokeless tobacco: Never Used  . Alcohol Use: No  . Drug Use: No  . Sexual Activity:    Partners: Male   Other Topics Concern  . 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  albuterol (PROVENTIL HFA;VENTOLIN HFA) 108 (90 BASE) MCG/ACT inhaler, Inhale 2 puffs into the lungs 4 (four) times daily as needed for wheezing or shortness of breath., Disp: 18 g, Rfl: 11 .  atorvastatin (LIPITOR) 40 MG tablet, Take 1 tablet (40 mg total) by mouth daily., Disp: 30 tablet, Rfl: 2 .  butalbital-acetaminophen-caffeine (FIORICET, ESGIC) 50-325-40 MG per tablet, Take 1 tablet by mouth 2 (two) times daily as needed., Disp: 14 tablet, Rfl: 1 .  cetirizine-pseudoephedrine (ZYRTEC-D) 5-120 MG per tablet, Take 1 tablet by mouth daily as  needed for allergies., Disp: 30 tablet, Rfl: 11 .  dicyclomine (BENTYL) 10 MG capsule, Take 1 tab at bedtime., Disp: 30 capsule, Rfl: 8 .  FeFum-FePoly-FA-B Cmp-C-Biot (INTEGRA PLUS) CAPS, Take one tablet every day, Disp: 30 capsule, Rfl: 2 .  fluocinonide cream (LIDEX) 0.05 %, Apply to affected area  twice daily, Disp: 60 g, Rfl: 1 .  glimepiride (AMARYL) 2 MG tablet, Take 1 tablet (2 mg total) by mouth daily with breakfast., Disp: 30 tablet, Rfl: 1 .  lisinopril-hydrochlorothiazide (PRINZIDE,ZESTORETIC) 10-12.5 MG per tablet, Take 1 tablet by mouth daily., Disp: 90 tablet, Rfl: 3 .  meloxicam (MOBIC) 15 MG tablet, TAKE 1 TABLET (15 MG TOTAL) BY MOUTH DAILY., Disp: 30 tablet, Rfl: 0 .  montelukast (SINGULAIR) 10 MG tablet, Take 1 tablet (10 mg total) by mouth daily., Disp: 90 tablet, Rfl: 1 .  Multiple Vitamin (MULTIVITAMIN) tablet,  Take 1 tablet by mouth daily. , Disp: , Rfl:  .  omeprazole-sodium bicarbonate (ZEGERID) 40-1100 MG per capsule, Take 1 capsule by mouth daily. , Disp: , Rfl:  .  promethazine (PHENERGAN) 25 MG tablet, Take 1 tablet (25 mg total) by mouth every 8 (eight) hours as needed for nausea or vomiting., Disp: 20 tablet, Rfl: 0 .  azithromycin (ZITHROMAX) 250 MG tablet, 2 tabs on day 1 then 1 tab daily, Disp: 6 tablet, Rfl: 0 .  fluticasone (FLOVENT HFA) 44 MCG/ACT inhaler, Inhale 2 puffs into the lungs 2 (two) times daily., Disp: 1 Inhaler, Rfl: 0 .  oxyCODONE-acetaminophen (PERCOCET) 10-325 MG per tablet, Take one to two tablets by mouth every six to eight hours as needed for pain. (Patient not taking: Reported on 02/19/2015), Disp: 50 tablet, Rfl: 0  EXAM:  Filed Vitals:   02/19/15 1550  BP: 118/88  Pulse: 106  Temp: 97.6 F (36.4 C)    There is no weight on file to calculate BMI.  GENERAL: vitals reviewed and listed above, alert, oriented, appears well hydrated and in no acute distress  HEENT: atraumatic, conjunttiva clear, no obvious abnormalities on inspection of external nose and ears, normal appearance of ear canals and TMs, clear nasal congestion, mild post oropharyngeal erythema with PND, no tonsillar edema or exudate, no sinus TTP  NECK: no obvious masses on inspection  LUNGS: clear to auscultation bilaterally, no wheezes, rales or rhonchi, good air movement  CV: HRRR, no peripheral edema  MS: moves all extremities without noticeable abnormality  PSYCH: pleasant and cooperative, no obvious depression or anxiety  ASSESSMENT AND PLAN:  Discussed the following assessment and plan:  Acute upper respiratory infection  Asthma, mild intermittent, uncomplicated  -given HPI and exam findings today, a serious infection or illness is unlikely. We discussed potential etiologies, with VURI being most likely, and advised supportive care and monitoring. We discussed treatment side effects,  likely course, antibiotic misuse, transmission, and signs of developing a serious illness. -given asthma symptoms mild - add inhaled steroid to avoid BS issues with systemic prednisone, abx if worsening -of course, we advised to return or notify a doctor immediately if symptoms worsen or persist or new concerns arise.    Patient Instructions  INSTRUCTIONS FOR UPPER RESPIRATORY INFECTION:  -plenty of rest and fluids  -flovent 2 puffs, twice daily for 2 weeks  -antibiotic and complete course if worsening, fevers, asthma symptoms worse - follow up here if worsening significantly or despite treatment; please shred and trash the prescription if you do not use it  -nasal saline wash 2-3 times daily (use prepackaged  nasal saline or bottled/distilled water if making your own)   -can use AFRIN nasal spray for drainage and nasal congestion - but do NOT use longer then 3-4 days  -can use tylenol (in no history of liver disease) or ibuprofen (if no history of kidney disease, bowel bleeding or significant heart disease) as directed for aches and sorethroat  -in the winter time, using a humidifier at night is helpful (please follow cleaning instructions)  -if you are taking a cough medication - use only as directed, may also try a teaspoon of honey to coat the throat and throat lozenges. If given a cough medication with codeine or hydrocodone or other narcotic please be advised that this contains a strong and  potentially addicting medication. Please follow instructions carefully, take as little as possible and only use AS NEEDED for severe cough. Discuss potential side effects with your pharmacy. Please do not drive or operate machinery while taking these types of medications. Please do not take other sedating medications, drugs or alcohol while taking this medication without discussing with your doctor.  -for sore throat, salt water gargles can help  -follow up if you have fevers, facial pain, tooth  pain, difficulty breathing or are worsening or symptoms persist longer then expected  Upper Respiratory Infection, Adult An upper respiratory infection (URI) is also known as the common cold. It is often caused by a type of germ (virus). Colds are easily spread (contagious). You can pass it to others by kissing, coughing, sneezing, or drinking out of the same glass. Usually, you get better in 1 to 3  weeks.  However, the cough can last for even longer. HOME CARE   Only take medicine as told by your doctor. Follow instructions provided above.  Drink enough water and fluids to keep your pee (urine) clear or pale yellow.  Get plenty of rest.  Return to work when your temperature is < 100 for 24 hours or as told by your doctor. You may use a face mask and wash your hands to stop your cold from spreading. GET HELP RIGHT AWAY IF:   After the first few days, you feel you are getting worse.  You have questions about your medicine.  You have chills, shortness of breath, or red spit (mucus).  You have pain in the face for more then 1-2 days, especially when you bend forward.  You have a fever, puffy (swollen) neck, pain when you swallow, or white spots in the back of your throat.  You have a bad headache, ear pain, sinus pain, or chest pain.  You have a high-pitched whistling sound when you breathe in and out (wheezing).  You cough up blood.  You have sore muscles or a stiff neck. MAKE SURE YOU:   Understand these instructions.  Will watch your condition.  Will get help right away if you are not doing well or get worse. Document Released: 03/10/2008 Document Revised: 12/15/2011 Document Reviewed: 12/28/2013 [facility] Patient Information 2015 [place]. This information is not intended to replace advice given to you by your health care provider. Make sure you discuss any questions you have with your health care provider.      Kriste BasqueKIM, Felipe Paluch R.

## 2015-02-19 NOTE — Progress Notes (Signed)
Pre visit review using our clinic review tool, if applicable. No additional management support is needed unless otherwise documented below in the visit note. 

## 2015-02-19 NOTE — Patient Instructions (Signed)
INSTRUCTIONS FOR UPPER RESPIRATORY INFECTION:  -plenty of rest and fluids  -flovent 2 puffs, twice daily for 2 weeks  -antibiotic and complete course if worsening, fevers, asthma symptoms worse - follow up here if worsening significantly or despite treatment; please shred and trash the prescription if you do not use it  -nasal saline wash 2-3 times daily (use prepackaged nasal saline or bottled/distilled water if making your own)   -can use AFRIN nasal spray for drainage and nasal congestion - but do NOT use longer then 3-4 days  -can use tylenol (in no history of liver disease) or ibuprofen (if no history of kidney disease, bowel bleeding or significant heart disease) as directed for aches and sorethroat  -in the winter time, using a humidifier at night is helpful (please follow cleaning instructions)  -if you are taking a cough medication - use only as directed, may also try a teaspoon of honey to coat the throat and throat lozenges. If given a cough medication with codeine or hydrocodone or other narcotic please be advised that this contains a strong and  potentially addicting medication. Please follow instructions carefully, take as little as possible and only use AS NEEDED for severe cough. Discuss potential side effects with your pharmacy. Please do not drive or operate machinery while taking these types of medications. Please do not take other sedating medications, drugs or alcohol while taking this medication without discussing with your doctor.  -for sore throat, salt water gargles can help  -follow up if you have fevers, facial pain, tooth pain, difficulty breathing or are worsening or symptoms persist longer then expected  Upper Respiratory Infection, Adult An upper respiratory infection (URI) is also known as the common cold. It is often caused by a type of germ (virus). Colds are easily spread (contagious). You can pass it to others by kissing, coughing, sneezing, or drinking out  of the same glass. Usually, you get better in 1 to 3  weeks.  However, the cough can last for even longer. HOME CARE   Only take medicine as told by your doctor. Follow instructions provided above.  Drink enough water and fluids to keep your pee (urine) clear or pale yellow.  Get plenty of rest.  Return to work when your temperature is < 100 for 24 hours or as told by your doctor. You may use a face mask and wash your hands to stop your cold from spreading. GET HELP RIGHT AWAY IF:   After the first few days, you feel you are getting worse.  You have questions about your medicine.  You have chills, shortness of breath, or red spit (mucus).  You have pain in the face for more then 1-2 days, especially when you bend forward.  You have a fever, puffy (swollen) neck, pain when you swallow, or white spots in the back of your throat.  You have a bad headache, ear pain, sinus pain, or chest pain.  You have a high-pitched whistling sound when you breathe in and out (wheezing).  You cough up blood.  You have sore muscles or a stiff neck. MAKE SURE YOU:   Understand these instructions.  Will watch your condition.  Will get help right away if you are not doing well or get worse. Document Released: 03/10/2008 Document Revised: 12/15/2011 Document Reviewed: 12/28/2013 [facility] Patient Information 2015 [place], [place]. This information is not intended to replace advice given to you by your health care provider. Make sure you discuss any questions you have with  your health care provider.

## 2015-02-22 ENCOUNTER — Ambulatory Visit (INDEPENDENT_AMBULATORY_CARE_PROVIDER_SITE_OTHER): Payer: 59 | Admitting: Podiatry

## 2015-02-22 ENCOUNTER — Encounter: Payer: Self-pay | Admitting: Podiatry

## 2015-02-22 DIAGNOSIS — M7672 Peroneal tendinitis, left leg: Secondary | ICD-10-CM

## 2015-02-22 DIAGNOSIS — Z9889 Other specified postprocedural states: Secondary | ICD-10-CM

## 2015-02-22 DIAGNOSIS — M722 Plantar fascial fibromatosis: Secondary | ICD-10-CM | POA: Diagnosis not present

## 2015-02-22 MED ORDER — MELOXICAM 15 MG PO TABS: 15.0000 mg | ORAL_TABLET | Freq: Every day | ORAL | Status: DC

## 2015-02-24 NOTE — Progress Notes (Signed)
She presents today for follow-up of her EPF and peroneal tendon repair. She states this seems to be doing better.  Objective: Vital signs are stable as alert and oriented 3 cast was removed left lower extremity. Staples were removed. Minimal edema no erythema cellulitis drainage or odor. She has good range of motion of the ankle joint mild tenderness on palpation of the foot with some tingling to the plantar aspect of the foot with dependency or weight.  Assessment: Well-healing surgical foot status post EPF and peroneal tendon repair left.  Plan: Encouraged ambulation with a Cam Walker. We will allow her to start washing this cleaning it and soaking it. Follow up with me in 2 weeks. If necessary we will send her to physical therapy.

## 2015-03-08 ENCOUNTER — Encounter: Payer: Self-pay | Admitting: Podiatry

## 2015-03-08 ENCOUNTER — Telehealth: Payer: Self-pay | Admitting: *Deleted

## 2015-03-08 ENCOUNTER — Ambulatory Visit (INDEPENDENT_AMBULATORY_CARE_PROVIDER_SITE_OTHER): Payer: 59 | Admitting: Podiatry

## 2015-03-08 VITALS — BP 156/96 | HR 80 | Temp 96.5°F | Resp 16

## 2015-03-08 DIAGNOSIS — Z9889 Other specified postprocedural states: Secondary | ICD-10-CM | POA: Diagnosis not present

## 2015-03-08 NOTE — Progress Notes (Signed)
She presents today 6 weeks status post peroneal tendon repair left she states that still quite tender but seems to be worse when the weather changes.  Objective: Vital signs are stable she is alert and oriented 3. She presents today without a brace. She has minimal pain on palpation or on range of motion against resistance.  Assessment: Well-healing peroneal tendon repair left. Well-healing endoscopic plantar fasciotomy left.  Plan: At this point we are to send her for an evaluation and treatment with physical therapy 3 times a week for 4 weeks and I will follow-up with her at that time.

## 2015-03-08 NOTE — Telephone Encounter (Signed)
Dr. Al CorpusHyatt [facility] and treatment of post-op left foot to increase strength, mobility and decrease pain.  I spoke with Annabelle Harmanana at [phone], requesting PT order forms.  Annabelle HarmanDana states this initial referral can be on our office fax form, with copy of insurance card and pt data sheet.  Done.

## 2015-03-16 ENCOUNTER — Encounter: Payer: Self-pay | Admitting: Family Medicine

## 2015-03-16 ENCOUNTER — Ambulatory Visit (INDEPENDENT_AMBULATORY_CARE_PROVIDER_SITE_OTHER): Payer: 59 | Admitting: Family Medicine

## 2015-03-16 VITALS — BP 122/86 | HR 104 | Temp 97.4°F | Ht 63.0 in | Wt 169.0 lb

## 2015-03-16 DIAGNOSIS — E119 Type 2 diabetes mellitus without complications: Secondary | ICD-10-CM

## 2015-03-16 DIAGNOSIS — J309 Allergic rhinitis, unspecified: Secondary | ICD-10-CM

## 2015-03-16 DIAGNOSIS — J4520 Mild intermittent asthma, uncomplicated: Secondary | ICD-10-CM

## 2015-03-16 DIAGNOSIS — E785 Hyperlipidemia, unspecified: Secondary | ICD-10-CM

## 2015-03-16 DIAGNOSIS — I10 Essential (primary) hypertension: Secondary | ICD-10-CM | POA: Diagnosis not present

## 2015-03-16 MED ORDER — ATORVASTATIN CALCIUM 40 MG PO TABS: 40.0000 mg | ORAL_TABLET | Freq: Every day | ORAL | Status: DC

## 2015-03-16 MED ORDER — FLUTICASONE PROPIONATE HFA 44 MCG/ACT IN AERO: 2.0000 | INHALATION_SPRAY | Freq: Two times a day (BID) | RESPIRATORY_TRACT | Status: DC

## 2015-03-16 MED ORDER — PREDNISONE 20 MG PO TABS: 40.0000 mg | ORAL_TABLET | Freq: Every day | ORAL | Status: DC

## 2015-03-16 MED ORDER — MONTELUKAST SODIUM 10 MG PO TABS: 10.0000 mg | ORAL_TABLET | Freq: Every day | ORAL | Status: DC

## 2015-03-16 MED ORDER — OMEPRAZOLE-SODIUM BICARBONATE 40-1100 MG PO CAPS: 1.0000 | ORAL_CAPSULE | Freq: Every day | ORAL | Status: AC

## 2015-03-16 MED ORDER — GLIMEPIRIDE 2 MG PO TABS: 2.0000 mg | ORAL_TABLET | Freq: Every day | ORAL | Status: DC

## 2015-03-16 NOTE — Addendum Note (Signed)
Addended by: Johnella Moloney on: 03/16/2015 03:26 PM   Modules accepted: Orders

## 2015-03-16 NOTE — Progress Notes (Signed)
Pre visit review using our clinic review tool, if applicable. No additional management support is needed unless otherwise documented below in the visit note. 

## 2015-03-16 NOTE — Progress Notes (Signed)
HPI:  DM: -meds:amaryl 2 mg, acei -can't tol metformin - reports made her have nausea. -last visit reported could not tol amaryl due to low BS and wanted to do onglyza -can't tol onglyza due to "making her sick" -diet and exercise: regular exercise - exercise video every other day, diet is so so -sees podiatrist -last eye exam: denies diabetic retinopathy -denies: polyuria, polydipsia,wounds, hypoglycemia - occ feels a little shaky and sweaty   HTN: -meds: lisinopril-hctz 10-12.5 -denies: CP, SOB, DOE, swelling  HLD -meds: lipitor -denies: leg cramps, cog changes  IBS/GERD: -managed by GI -meds: on zegerid and phenergan, bentyl and integra  Allergies/Mild Asthma: -meds: alb prn, zyrtec D, singulair, flovent -denies: SOB, wheezing -had bronchitis in may and has improved but continued cough and asthma symptoms at night that respond to albuterol  Migraines: -uses fioricet prn  Iron def anemia: -heavy menstrual periods that are irr, last period 10 days and heavy in October and several episodes of bleeding in October -seeing GI and gyn for work up  [facility] D[facility] [place] - [place] [facility] [place] pain and radiation of pain - reports needs referral every year to follow up -doing PT -just had MRI and xrays -takes meloxicam - tries to limit this  L foot pain: -s/p peroneal tendon repair -seeing podiatry and PT  ROS: See pertinent positives and negatives per HPI.  Past Medical History  Diagnosis Date  . ALLERGIC RHINITIS 10/08/2007  . ASTHMA 04/22/2007  . GERD 04/22/2007  . Headache(784.0) 10/08/2007  . HYPERTENSION 04/22/2007  . HYPERLIPIDEMIA 04/22/2007  . LOW BACK PAIN 06/27/2008  . LUQ PAIN 09/06/2010  . NAUSEA 09/06/2010  . IBS (irritable bowel syndrome)     constipation predominant  . PONV (postoperative nausea and vomiting)   . Diabetes mellitus without complication   . Arthritis     Past Surgical History  Procedure Laterality Date  .  Knee surgery  92, 96, 01, 08    right  . Knee surgery  88    left  . Nasal polyp surgery  95    sinus  . Lumbar fusion  05    L4-5 S1  . Tubal ligation    . Cesarean section    . Knee arthroplasty Right 04/29/2013    Procedure: COMPUTER ASSISTED TOTAL KNEE ARTHROPLASTY;  Surgeon: Eldred Manges, MD;  Location: [facility] OR;  Service: Orthopedics;  Laterality: Right;  Right Total Knee Arthroplasty, Cemented    Family History  Problem Relation Age of Onset  . Hypertension Mother   . Arthritis Mother     OA s/p bilateral hip replacement  . Diabetes Father   . Heart disease Father     CAD  . Hyperlipidemia Father   . Hypertension Father   . Pulmonary fibrosis Father   . Osteopenia Sister     older with GI problems s/p colectomy  . Cancer Maternal Grandmother     Ovarian Cancer  . Osteopenia Sister     History   Social History  . Marital Status: Married    Spouse Name: N/A  . Number of Children: 2  . Years of Education: 16   Occupational History  . substitute teacher [employer]   Social History Main Topics  . Smoking status: Never Smoker   . Smokeless tobacco: Never Used  . Alcohol Use: No  . Drug Use: No  . Sexual Activity:    Partners: Male   Other Topics  Concern  . 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  albuterol (PROVENTIL HFA;VENTOLIN HFA) 108 (90 BASE) MCG/ACT inhaler, Inhale 2 puffs into the lungs 4 (four) times daily as needed for wheezing or shortness of breath., Disp: 18 g, Rfl: 11 .  atorvastatin (LIPITOR) 40 MG tablet, Take 1 tablet (40 mg total) by mouth daily., Disp: 30 tablet, Rfl: 2 .  butalbital-acetaminophen-caffeine (FIORICET, ESGIC) 50-325-40 MG per tablet, Take 1 tablet by mouth 2 (two)  times daily as needed., Disp: 14 tablet, Rfl: 1 .  cetirizine-pseudoephedrine (ZYRTEC-D) 5-120 MG per tablet, Take 1 tablet by mouth daily as needed for allergies., Disp: 30 tablet, Rfl: 11 .  dicyclomine (BENTYL) 10 MG capsule, Take 1 tab at bedtime., Disp: 30 capsule, Rfl: 8 .  FeFum-FePoly-FA-B Cmp-C-Biot (INTEGRA PLUS) CAPS, Take one tablet every day, Disp: 30 capsule, Rfl: 2 .  fluocinonide cream (LIDEX) 0.05 %, Apply to affected area  twice daily, Disp: 60 g, Rfl: 1 .  fluticasone (FLOVENT HFA) 44 MCG/ACT inhaler, Inhale 2 puffs into the lungs 2 (two) times daily., Disp: 1 Inhaler, Rfl: 0 .  glimepiride (AMARYL) 2 MG tablet, Take 1 tablet (2 mg total) by mouth daily with breakfast., Disp: 30 tablet, Rfl: 1 .  lisinopril-hydrochlorothiazide (PRINZIDE,ZESTORETIC) 10-12.5 MG per tablet, Take 1 tablet by mouth daily., Disp: 90 tablet, Rfl: 3 .  montelukast (SINGULAIR) 10 MG tablet, Take 1 tablet (10 mg total) by mouth daily., Disp: 90 tablet, Rfl: 1 .  Multiple Vitamin (MULTIVITAMIN) tablet, Take 1 tablet by mouth daily. , Disp: , Rfl:  .  omeprazole-sodium bicarbonate (ZEGERID) 40-1100 MG per capsule, Take 1 capsule by mouth daily. , Disp: , Rfl:  .  promethazine (PHENERGAN) 25 MG tablet, Take 1 tablet (25 mg total) by mouth every 8 (eight) hours as needed for nausea or vomiting., Disp: 20 tablet, Rfl: 0 .  predniSONE (DELTASONE) 20 MG tablet, Take 2 tablets (40 mg total) by mouth daily with breakfast., Disp: 6 tablet, Rfl: 0  EXAM:  Filed Vitals:   03/16/15 1457  BP: 122/86  Pulse: 104  Temp: 97.4 F (36.3 C)    Body mass index is 29.94 kg/(m^2).  GENERAL: vitals reviewed and listed above, alert, oriented, appears well hydrated and in no acute distress  HEENT: atraumatic, conjunttiva clear, no obvious abnormalities on inspection of external nose and ears  NECK: no obvious masses on inspection  LUNGS: clear to auscultation bilaterally, no wheezes, rales or rhonchi, good air  movement  CV: HRRR, no peripheral edema  MS: moves all extremities without noticeable abnormality  PSYCH: pleasant and cooperative, no obvious depression or anxiety  ASSESSMENT AND PLAN:  Discussed the following assessment and plan:  Asthma, mild intermittent, uncomplicated  Allergic rhinitis, unspecified allergic rhinitis type  Type 2 diabetes mellitus without complication  Essential hypertension  Hyperlipemia  -check HgbA1c and BMP at follow up -lifestyle recs -short course of prednisone for the asthma, discussed stopping acei but she does not want to as reports it is the only thing that works for her BP -refilled flovent, follow up in 3 months or as needed if resp  symptoms not improving -follow up in 3-4 months -Patient advised to return or notify a doctor immediately if symptoms worsen or persist or new concerns arise.  Patient Instructions  BEFORE YOU LEAVE: -schedule follow up in 2-3 months - come fasting  -cont allergy and asthma medications, take the short course of prednisone, call if not improving, may need to stop lisinopril  We recommend the following healthy lifestyle measures: - eat a healthy diet consisting of lots of vegetables, fruits, beans, nuts, seeds, healthy meats such as white chicken and fish - avoid fried foods, starches, sweets, fast food, processed foods, sodas, red meet and other fattening foods.  - get a least 150 minutes of aerobic exercise per week.       Olivia Basque R.

## 2015-03-16 NOTE — Patient Instructions (Signed)
BEFORE YOU LEAVE: -schedule follow up in 2-3 months - come fasting  -cont allergy and asthma medications, take the short course of prednisone, call if not improving, may need to stop lisinopril  We recommend the following healthy lifestyle measures: - eat a healthy diet consisting of lots of vegetables, fruits, beans, nuts, seeds, healthy meats such as white chicken and fish - avoid fried foods, starches, sweets, fast food, processed foods, sodas, red meet and other fattening foods.  - get a least 150 minutes of aerobic exercise per week.

## 2015-03-16 NOTE — Addendum Note (Signed)
Addended by: Johnella Moloney on: 03/16/2015 03:23 PM   Modules accepted: Orders

## 2015-03-26 ENCOUNTER — Other Ambulatory Visit: Payer: Self-pay | Admitting: *Deleted

## 2015-03-26 ENCOUNTER — Encounter: Payer: Self-pay | Admitting: Family Medicine

## 2015-03-26 MED ORDER — ATORVASTATIN CALCIUM 40 MG PO TABS: 40.0000 mg | ORAL_TABLET | Freq: Every day | ORAL | Status: DC

## 2015-03-26 MED ORDER — LISINOPRIL-HYDROCHLOROTHIAZIDE 10-12.5 MG PO TABS: 1.0000 | ORAL_TABLET | Freq: Every day | ORAL | Status: DC

## 2015-03-26 NOTE — Telephone Encounter (Signed)
Rxs done and the pt was informed via Mychart message. 

## 2015-03-29 ENCOUNTER — Ambulatory Visit (INDEPENDENT_AMBULATORY_CARE_PROVIDER_SITE_OTHER): Payer: 59 | Admitting: Podiatry

## 2015-03-29 ENCOUNTER — Encounter: Payer: Self-pay | Admitting: Podiatry

## 2015-03-29 VITALS — BP 126/81 | HR 98 | Temp 97.3°F | Resp 14

## 2015-03-29 DIAGNOSIS — Z9889 Other specified postprocedural states: Secondary | ICD-10-CM

## 2015-03-29 NOTE — Progress Notes (Signed)
She presents today 9 weeks status post EPF left and lateral ankle peroneal tendon repair. She states is currently she is in physical therapy for the past 2 weeks and seems to be doing better.  Objective: Vital signs are stable she's alert and oriented 3 she has good range of motion of all of the joint should mild tenderness on palpation at the incision site of the EPF medially. No erythema edema saline drainage or odor. Lateral para-nail repair appears to be healing quite nicely see no signs of infection. She has much stronger para-nails at this point and a considerable reduction in edema.  Assessment: What nonsurgical foot left.  Plan: Follow up with me first week of August.

## 2015-04-11 ENCOUNTER — Other Ambulatory Visit: Payer: Self-pay | Admitting: Family Medicine

## 2015-04-11 NOTE — Telephone Encounter (Signed)
Printed [facility] [place].

## 2015-05-08 ENCOUNTER — Encounter: Payer: Self-pay | Admitting: Podiatry

## 2015-05-08 ENCOUNTER — Ambulatory Visit (INDEPENDENT_AMBULATORY_CARE_PROVIDER_SITE_OTHER): Payer: 59 | Admitting: Podiatry

## 2015-05-08 VITALS — BP 161/79 | HR 90 | Resp 12

## 2015-05-08 DIAGNOSIS — Z9889 Other specified postprocedural states: Secondary | ICD-10-CM

## 2015-05-09 NOTE — Progress Notes (Signed)
She presents today for follow-up visit regarding her peroneal tendon repair performed earlier this year. She states that finally at this point she is pain-free and swelling has reduced.  Objective: Vital signs are stable she is alert and oriented 3. She has no pain on palpation of the endoscopic plantar fasciotomy site nor does she have any pain on palpation of the peroneal tendon incision. Minimal edema and no erythema cellulitis drainage or odor.  Assessment: Well-healing surgical foot left.  Plan: Follow up with me as needed.

## 2015-05-29 ENCOUNTER — Encounter: Payer: Self-pay | Admitting: Family Medicine

## 2015-06-13 ENCOUNTER — Telehealth: Payer: Self-pay

## 2015-06-13 NOTE — Telephone Encounter (Signed)
PLEASE NOTE: All timestamps contained within this report are represented as [place] Standard Time. CONFIDENTIALTY NOTICE: This fax transmission is intended only for the addressee. It contains information that is legally privileged, confidential or otherwise protected from use or disclosure. If you are not the intended recipient, you are strictly prohibited from reviewing, disclosing, copying using or disseminating any of this information or taking any action in reliance on or regarding this information. If you have received this fax in error, please notify us immediately by telephone so that we can arrange for its return to [place]. Phone: [phone], Toll-Free: [phone], Fax: [phone] Page: 1 of 2 Call Id: [id number] [facility] Night - Client TELEPHONE ADVICE RECORD [facility] Patient Name: [name] Gender: Female DOB: 10-21-1965 Age: 49 Y 6 M 26 D Return Phone Number: [phone] (Primary) Address: City/State/Zip: [place] Client [facility] Night - Client Client Site [facility] - Night Physician Kriste Basque Contact Type Call Call Type Triage / Clinical Relationship To Patient Self Return Phone Number [phone] (Primary) Chief Complaint Diarrhea Initial Comment Diarrhea for 4 days PreDisposition Did not know what to do Nurse Assessment Nurse: Su Hilt, RN, [place] Date/Time (Eastern Time): 06/11/2015 1:32:03 PM Confirm and document reason for call. If symptomatic, describe symptoms. ---Caller states that she has had diarrhea for the past 4 days Has the patient traveled out of the country within the last 30 days? ---No Does the patient require triage? ---Yes Related visit to physician within the last 2 weeks? ---No Does the PT have any chronic conditions? (i.e. diabetes, asthma, etc.) ---Yes List chronic conditions. ---Hypertension. Asthma, diabetes Did the patient indicate they were pregnant?  ---No Guidelines Guideline Title Affirmed Question Affirmed Notes Nurse Date/Time (Eastern Time) Diarrhea [1] MODERATE diarrhea (e.g., 4-6 times / day more than normal) AND [2] present > 48 hours (2 days) Su Hilt, RN, [place] 06/11/2015 1:32:59 PM Disp. Time Lamount Cohen Time) Disposition Final User 06/11/2015 1:37:49 PM See Physician within 24 Hours Yes Su Hilt, RN, [employer] Caller Understands: Yes Disagree/Comply: Comply Care Advice Given Per Guideline PLEASE NOTE: All timestamps contained within this report are represented as [place] Standard Time. CONFIDENTIALTY NOTICE: This fax transmission is intended only for the addressee. It contains information that is legally privileged, confidential or otherwise protected from use or disclosure. If you are not the intended recipient, you are strictly prohibited from reviewing, disclosing, copying using or disseminating any of this information or taking any action in reliance on or regarding this information. If you have received this fax in error, please notify us immediately by telephone so that we can arrange for its return to [place]. Phone: [phone], Toll-Free: [phone], Fax: [phone] Page: 2 of 2 Call Id: [id number] Care Advice Given Per Guideline SEE PHYSICIAN WITHIN 24 HOURS: * Drink more fluids. CLEAR FLUIDS: * Sip water or a half-strength sports drink (Gatorade or Powerade; mix half and half with water) * Other options: oral rehydration solution (Pedialyte or Rehydralyte) . CALL BACK IF: * Bloody stools * Constant or severe abdominal pain * You become worse. * Signs of dehydration occur (e.g., no urine over 12 hours, very dry mouth, lightheaded, etc.) After Care Instructions Given Call Event Type User Date / Time Description Referrals REFERRED TO PCP OFFICE   Called to check on pt to see how she was feeling.  Left a message for pt to call office back if further assistance is needed.

## 2015-07-03 ENCOUNTER — Ambulatory Visit: Payer: 59 | Admitting: Family Medicine

## 2015-08-09 ENCOUNTER — Encounter: Payer: Self-pay | Admitting: Family Medicine

## 2015-08-09 ENCOUNTER — Ambulatory Visit (INDEPENDENT_AMBULATORY_CARE_PROVIDER_SITE_OTHER): Payer: 59 | Admitting: Family Medicine

## 2015-08-09 VITALS — BP 112/82 | HR 89 | Temp 97.6°F | Ht 63.0 in | Wt 160.2 lb

## 2015-08-09 DIAGNOSIS — D509 Iron deficiency anemia, unspecified: Secondary | ICD-10-CM | POA: Diagnosis not present

## 2015-08-09 DIAGNOSIS — E119 Type 2 diabetes mellitus without complications: Secondary | ICD-10-CM

## 2015-08-09 DIAGNOSIS — I10 Essential (primary) hypertension: Secondary | ICD-10-CM | POA: Diagnosis not present

## 2015-08-09 DIAGNOSIS — E785 Hyperlipidemia, unspecified: Secondary | ICD-10-CM

## 2015-08-09 DIAGNOSIS — G8929 Other chronic pain: Secondary | ICD-10-CM

## 2015-08-09 DIAGNOSIS — K219 Gastro-esophageal reflux disease without esophagitis: Secondary | ICD-10-CM | POA: Diagnosis not present

## 2015-08-09 DIAGNOSIS — R718 Other abnormality of red blood cells: Secondary | ICD-10-CM

## 2015-08-09 DIAGNOSIS — J4520 Mild intermittent asthma, uncomplicated: Secondary | ICD-10-CM

## 2015-08-09 DIAGNOSIS — M545 Low back pain: Secondary | ICD-10-CM

## 2015-08-09 LAB — CBC WITH DIFFERENTIAL/PLATELET
Basophils Absolute: 0 10*3/uL (ref 0.0–0.1)
Basophils Relative: 0.2 % (ref 0.0–3.0)
EOS PCT: 1 % (ref 0.0–5.0)
Eosinophils Absolute: 0.1 10*3/uL (ref 0.0–0.7)
HEMATOCRIT: 48.2 % — AB (ref 36.0–46.0)
Hemoglobin: 15.9 g/dL — ABNORMAL HIGH (ref 12.0–15.0)
LYMPHS ABS: 1.3 10*3/uL (ref 0.7–4.0)
LYMPHS PCT: 14.8 % (ref 12.0–46.0)
MCHC: 33 g/dL (ref 30.0–36.0)
MCV: 92.6 fl (ref 78.0–100.0)
MONOS PCT: 9.3 % (ref 3.0–12.0)
Monocytes Absolute: 0.8 10*3/uL (ref 0.1–1.0)
NEUTROS ABS: 6.4 10*3/uL (ref 1.4–7.7)
NEUTROS PCT: 74.7 % (ref 43.0–77.0)
PLATELETS: 220 10*3/uL (ref 150.0–400.0)
RBC: 5.21 Mil/uL — ABNORMAL HIGH (ref 3.87–5.11)
RDW: 13.7 % (ref 11.5–15.5)
WBC: 8.6 10*3/uL (ref 4.0–10.5)

## 2015-08-09 LAB — BASIC METABOLIC PANEL
BUN: 22 mg/dL (ref 6–23)
CALCIUM: 10.1 mg/dL (ref 8.4–10.5)
CHLORIDE: 101 meq/L (ref 96–112)
CO2: 31 meq/L (ref 19–32)
CREATININE: 0.78 mg/dL (ref 0.40–1.20)
GFR: 83.52 mL/min (ref >60.00)
Glucose, Bld: 124 mg/dL — ABNORMAL HIGH (ref 70–99)
Potassium: 4.9 mEq/L (ref 3.5–5.1)
Sodium: 139 mEq/L (ref 135–145)

## 2015-08-09 LAB — HEMOGLOBIN A1C: Hgb A1c MFr Bld: 6.2 % (ref 4.6–6.5)

## 2015-08-09 NOTE — Progress Notes (Signed)
Pre visit review using our clinic review tool, if applicable. No additional management support is needed unless otherwise documented below in the visit note. 

## 2015-08-09 NOTE — Progress Notes (Signed)
HPI:  DM: -meds:amaryl 2 mg, acei -can't tol metformin - reports made her have nausea. -can't tol onglyza due to "making her sick" -diet and exercise:no regular exercise, diet is so so -sees podiatrist -last eye exam: denies diabetic retinopathy -denies: polyuria, polydipsia,wounds, hypoglycemia - occ feels a little shaky and sweaty   HTN: -meds: lisinopril-hctz 10-12.5 -denies: CP, SOB, DOE, swelling  HLD -meds: lipitor -denies: leg cramps, cog changes  IBS/GERD: -managed by GI -meds: on zegerid and phenergan, bentyl and integra  Allergies/Mild Asthma: -meds: alb prn, zyrtec D, singulair, flovent -denies: SOB, wheezing -reports can't remember last time she had to use albuterol as has been doing really well  Migraines: -uses fioricet prn  Iron def anemia: -reported heavy menstrual periods that were irr in the past - though denies this now, sees gyn -saw GI and gyn for work up in the past and told to take iron - she reports made her constipated so does not take -has had colonoscopy and EGD in the past -wants to check labs to see if still has anemia  [facility] D[facility] and [facility] [place] - [place] [facility] [place] pain w/ radiculopathy -takes meloxicam - tries to limit this  L foot pain: -s/p peroneal tendon repair -seeing podiatry and PT  ROS: See pertinent positives and negatives per HPI.  Past Medical History  Diagnosis Date  . ALLERGIC RHINITIS 10/08/2007  . ASTHMA 04/22/2007  . GERD 04/22/2007  . Headache(784.0) 10/08/2007  . HYPERTENSION 04/22/2007  . HYPERLIPIDEMIA 04/22/2007  . LOW BACK PAIN 06/27/2008  . LUQ PAIN 09/06/2010  . NAUSEA 09/06/2010  . IBS (irritable bowel syndrome)     constipation predominant  . PONV (postoperative nausea and vomiting)   . Diabetes mellitus without complication (HCC)   . Arthritis     Past Surgical History  Procedure Laterality Date  . Knee surgery  92, 96, 01, 08    right  . Knee surgery  88    left  .  Nasal polyp surgery  95    sinus  . Lumbar fusion  05    L4-5 S1  . Tubal ligation    . Cesarean section    . Knee arthroplasty Right 04/29/2013    Procedure: COMPUTER ASSISTED TOTAL KNEE ARTHROPLASTY;  Surgeon: Eldred Manges, MD;  Location: [facility] OR;  Service: Orthopedics;  Laterality: Right;  Right Total Knee Arthroplasty, Cemented    Family History  Problem Relation Age of Onset  . Hypertension Mother   . Arthritis Mother     OA s/p bilateral hip replacement  . Diabetes Father   . Heart disease Father     CAD  . Hyperlipidemia Father   . Hypertension Father   . Pulmonary fibrosis Father   . Osteopenia Sister     older with GI problems s/p colectomy  . Cancer Maternal Grandmother     Ovarian Cancer  . Osteopenia Sister     Social History   Social History  . Marital Status: Married    Spouse Name: N/A  . Number of Children: 2  . Years of Education: 16   Occupational History  . substitute teacher [employer]   Social History Main Topics  . Smoking status: Never Smoker   . Smokeless tobacco: Never Used  . Alcohol Use: No  . Drug Use: No  . Sexual Activity:    Partners: Male   Other Topics Concern  . None   Social History Narrative  Updated 08/09/2015   Work or School: Now retired as of 07/2015; prior work: [employer], part time Theatre managerteacher      Home Situation: son with special needs, sick a lot also 717 yo daughter      Spiritual Beliefs: christian      Lifestyle: no regular exercise, diet in so so           Current outpatient prescriptions:  .  acetaminophen (TYLENOL ARTHRITIS PAIN) 650 MG CR tablet, Take 650 mg by mouth as needed for pain., Disp: , Rfl:  .  albuterol (PROVENTIL HFA;VENTOLIN HFA) 108 (90 BASE) MCG/ACT inhaler, Inhale 2 puffs into the lungs 4 (four) times daily as needed for wheezing or shortness of breath., Disp: 18 g, Rfl: 11 .  atorvastatin (LIPITOR) 40 MG tablet, Take 1 tablet (40 mg total) by mouth daily., Disp: 90 tablet, Rfl: 1 .   baclofen (LIORESAL) 10 MG tablet, TK 1 T PO TID, Disp: , Rfl: 0 .  butalbital-acetaminophen-caffeine (FIORICET, ESGIC) 50-325-40 MG per tablet, Take one tablet not more then every 12 hours only for migraine.Follow up w/ doctor if worsening migraines., Disp: 14 tablet, Rfl: 1 .  cetirizine (ZYRTEC) 10 MG tablet, Take 10 mg by mouth daily., Disp: , Rfl:  .  glimepiride (AMARYL) 2 MG tablet, Take 1 tablet (2 mg total) by mouth daily with breakfast., Disp: 90 tablet, Rfl: 3 .  lisinopril-hydrochlorothiazide (PRINZIDE,ZESTORETIC) 10-12.5 MG per tablet, Take 1 tablet by mouth daily., Disp: 90 tablet, Rfl: 1 .  montelukast (SINGULAIR) 10 MG tablet, Take 1 tablet (10 mg total) by mouth daily., Disp: 90 tablet, Rfl: 3 .  omeprazole-sodium bicarbonate (ZEGERID) 40-1100 MG per capsule, Take 1 capsule by mouth daily., Disp: 90 capsule, Rfl: 3 .  Probiotic Product (PROBIOTIC PO), Take by mouth daily., Disp: , Rfl:   EXAM:  Filed Vitals:   08/09/15 0902  BP: 112/82  Pulse: 89  Temp: 97.6 F (36.4 C)    Body mass index is 28.39 kg/(m^2).  GENERAL: vitals reviewed and listed above, alert, oriented, appears well hydrated and in no acute distress  HEENT: atraumatic, conjunttiva clear, no obvious abnormalities on inspection of external nose and ears  NECK: no obvious masses on inspection  LUNGS: clear to auscultation bilaterally, no wheezes, rales or rhonchi, good air movement  CV: HRRR, no peripheral edema  MS: moves all extremities without noticeable abnormality  PSYCH: pleasant and cooperative, no obvious depression or anxiety  ASSESSMENT AND PLAN:  Discussed the following assessment and plan:  Hyperlipemia  Essential hypertension - Plan: Basic metabolic panel  Asthma, mild intermittent, uncomplicated  Gastroesophageal reflux disease, esophagitis presence not specified  Chronic low back pain, unspecified back pain laterality, with sciatica presence unspecified  Type 2 diabetes  mellitus without complication, without long-term current use of insulin (HCC) - Plan: Hemoglobin A1c  Iron deficiency anemia - Plan: CBC with Differential  -Patient advised to return or notify a doctor immediately if symptoms worsen or persist or new concerns arise.  Patient Instructions  BEFORE YOU LEAVE: -labs -follow up in 4-6 months  We recommend the following healthy lifestyle measures: - eat a healthy whole foods diet consisting of regular small meals composed of vegetables, fruits, beans, nuts, seeds, healthy meats such as white chicken and fish and whole grains.  - avoid sweets, white starchy foods, fried foods, fast food, processed foods, sodas, red meet and other fattening foods.  - get a least 150-300 minutes of aerobic exercise per week.   -We have  ordered labs or studies at this visit. It can take up to 1-2 weeks for results and processing. We will contact you with instructions IF your results are abnormal. Normal results will be released to your [facility]. If you have not heard from [place] or can not find your results in [facility] in 2 weeks please contact our office.            Kriste Basque R.

## 2015-08-09 NOTE — Patient Instructions (Signed)
BEFORE YOU LEAVE: -labs -follow up in 4-6 months  We recommend the following healthy lifestyle measures: - eat a healthy whole foods diet consisting of regular small meals composed of vegetables, fruits, beans, nuts, seeds, healthy meats such as white chicken and fish and whole grains.  - avoid sweets, white starchy foods, fried foods, fast food, processed foods, sodas, red meet and other fattening foods.  - get a least 150-300 minutes of aerobic exercise per week.   -We have ordered labs or studies at this visit. It can take up to 1-2 weeks for results and processing. We will contact you with instructions IF your results are abnormal. Normal results will be [facility]. If you have not heard from us or can not find your results in [facility] in 2 weeks please contact our office.

## 2015-08-21 ENCOUNTER — Encounter: Payer: Self-pay | Admitting: Family Medicine

## 2015-08-21 NOTE — Addendum Note (Signed)
Addended by: Johnella MoloneyFUNDERBURK, Aziyah Provencal A on: 08/21/2015 12:14 PM   Modules accepted: Orders

## 2015-08-28 ENCOUNTER — Encounter: Payer: Self-pay | Admitting: Family Medicine

## 2015-08-28 ENCOUNTER — Other Ambulatory Visit: Payer: Self-pay | Admitting: Family Medicine

## 2015-08-28 MED ORDER — ATORVASTATIN CALCIUM 40 MG PO TABS: 40.0000 mg | ORAL_TABLET | Freq: Every day | ORAL | Status: DC

## 2015-09-03 ENCOUNTER — Other Ambulatory Visit: Payer: Self-pay | Admitting: Family Medicine

## 2015-09-03 MED ORDER — ATORVASTATIN CALCIUM 40 MG PO TABS: 40.0000 mg | ORAL_TABLET | Freq: Every day | ORAL | Status: DC

## 2015-09-05 ENCOUNTER — Telehealth: Payer: Self-pay

## 2015-09-05 NOTE — Telephone Encounter (Signed)
Rx request for Butalbital/Acetaminophen/Caff Tablets- Take 1 tablet by mouth no more than every 12 hours for migraine #14.  Pharm:  [facility] [address]  Pls advise.

## 2015-09-06 MED ORDER — BUTALBITAL-APAP-CAFFEINE 50-325-40 MG PO TABS: ORAL_TABLET | ORAL | Status: DC

## 2015-09-14 ENCOUNTER — Other Ambulatory Visit: Payer: Self-pay | Admitting: *Deleted

## 2015-09-16 NOTE — Telephone Encounter (Signed)
She just got this filled Dec 1.  Would defer further refills to her primary.

## 2015-09-17 ENCOUNTER — Encounter: Payer: Self-pay | Admitting: Family Medicine

## 2015-09-17 MED ORDER — BUTALBITAL-APAP-CAFFEINE 50-325-40 MG PO TABS: ORAL_TABLET | ORAL | Status: DC

## 2015-09-17 MED ORDER — BLOOD GLUCOSE MONITOR KIT: PACK | Status: AC

## 2015-09-17 NOTE — Telephone Encounter (Signed)
The Rx printed and I called this in to [place] at [phone] and the pt was informed via Mychart message.  A generic glucometer and supply kit were sent via escribe to the pts pharmacy also.

## 2015-09-17 NOTE — Telephone Encounter (Signed)
I called [facility] and spoke with Theron AristaPeter and he stated the Rx was last filled on 8/19, [facility] and spoke with Clayborne DanaPatti, and [facility] [place] and spoke with Okey Regalarol and they all stated they have not filled an Rx for the pt.

## 2015-09-17 NOTE — Telephone Encounter (Signed)
Dr Selena BattenKim approved the Rx and this was printed and [facility] and informed [facility] via Mychart message.

## 2015-09-17 NOTE — Telephone Encounter (Signed)
This was just refilled Dec 1st. Can you check with pharmacy to see if was filled/rx went through? Then notify pt. Thanks.

## 2015-10-09 DIAGNOSIS — M47812 Spondylosis without myelopathy or radiculopathy, cervical region: Secondary | ICD-10-CM | POA: Insufficient documentation

## 2015-11-29 ENCOUNTER — Other Ambulatory Visit: Payer: Self-pay | Admitting: Family Medicine

## 2015-11-29 ENCOUNTER — Ambulatory Visit (INDEPENDENT_AMBULATORY_CARE_PROVIDER_SITE_OTHER): Payer: BLUE CROSS/BLUE SHIELD | Admitting: Family Medicine

## 2015-11-29 ENCOUNTER — Encounter: Payer: Self-pay | Admitting: Family Medicine

## 2015-11-29 VITALS — BP 120/90 | HR 113 | Temp 98.7°F | Ht 63.0 in | Wt 168.8 lb

## 2015-11-29 DIAGNOSIS — J4520 Mild intermittent asthma, uncomplicated: Secondary | ICD-10-CM

## 2015-11-29 DIAGNOSIS — J0100 Acute maxillary sinusitis, unspecified: Secondary | ICD-10-CM

## 2015-11-29 DIAGNOSIS — I10 Essential (primary) hypertension: Secondary | ICD-10-CM | POA: Diagnosis not present

## 2015-11-29 MED ORDER — PREDNISONE 20 MG PO TABS: 40.0000 mg | ORAL_TABLET | Freq: Every day | ORAL | Status: DC

## 2015-11-29 MED ORDER — DOXYCYCLINE HYCLATE 100 MG PO CAPS: 100.0000 mg | ORAL_CAPSULE | Freq: Two times a day (BID) | ORAL | Status: DC

## 2015-11-29 NOTE — Patient Instructions (Signed)
Please keep your scheduled follow-up in March.  Start the prednisone and the antibiotic(doxycycline) intake as instructed.  Use her asthma medications as instructed.  Follow-up promptly if worsening or symptoms persist.

## 2015-11-29 NOTE — Progress Notes (Signed)
Pre visit review using our clinic review tool, if applicable. No additional management support is needed unless otherwise documented below in the visit note. 

## 2015-11-29 NOTE — Progress Notes (Signed)
HPI:  [name] is a pleasant 50 year old female with a past medical history significant for allergies, asthma and diabetes, here for an acute visit for sinus congestion. She reports she has had 2-3 weeks of nasal congestion, postnasal drip, worsening of her asthma with occasional wheezing, and increased albuterol use. Over the last week she has developed some sinus pain thick nasal congestion. No fevers, severe shortness of breath, nausea, vomiting or diarrhea.  ROS: See pertinent positives and negatives per HPI.  Past Medical History  Diagnosis Date  . ALLERGIC RHINITIS 10/08/2007  . ASTHMA 04/22/2007  . GERD 04/22/2007  . Headache(784.0) 10/08/2007  . HYPERTENSION 04/22/2007  . HYPERLIPIDEMIA 04/22/2007  . LOW BACK PAIN 06/27/2008  . LUQ PAIN 09/06/2010  . NAUSEA 09/06/2010  . IBS (irritable bowel syndrome)     constipation predominant  . PONV (postoperative nausea and vomiting)   . Diabetes mellitus without complication ([place])   . Arthritis     Past Surgical History  Procedure Laterality Date  . Knee surgery  92, 96, 01, 08    right  . Knee surgery  88    left  . Nasal polyp surgery  95    sinus  . Lumbar fusion  05    L4-5 S1  . Tubal ligation    . Cesarean section    . Knee arthroplasty Right 04/29/2013    Procedure: COMPUTER ASSISTED TOTAL KNEE ARTHROPLASTY;  Surgeon: Marybelle Killings, MD;  Location: [facility];  Service: Orthopedics;  Laterality: Right;  Right Total Knee Arthroplasty, Cemented    Family History  Problem Relation Age of Onset  . Hypertension Mother   . Arthritis Mother     OA s/p bilateral hip replacement  . Diabetes Father   . Heart disease Father     CAD  . Hyperlipidemia Father   . Hypertension Father   . Pulmonary fibrosis Father   . Osteopenia Sister     older with GI problems s/p colectomy  . Cancer Maternal Grandmother     Ovarian Cancer  . Osteopenia Sister     Social History   Social History  . Marital Status: Married    Spouse Name:  N/A  . Number of Children: 2  . Years of Education: 16   Occupational History  . substitute teacher [employer]   Social History Main Topics  . Smoking status: Never Smoker   . Smokeless tobacco: Never Used  . Alcohol Use: No  . Drug Use: No  . Sexual Activity:    Partners: Male   Other Topics Concern  . None   Social History Narrative   Updated 08/09/2015   Work or School: Now retired as of 07/2015; prior work: Hydrologist, part time Personal assistant Situation: son with special needs, sick a lot also 35 yo daughter      Spiritual Beliefs: christian      Lifestyle: no regular exercise, diet in so so           Current outpatient prescriptions:  .  acetaminophen (TYLENOL ARTHRITIS PAIN) 650 MG CR tablet, Take 650 mg by mouth as needed for pain., Disp: , Rfl:  .  albuterol (PROVENTIL HFA;VENTOLIN HFA) 108 (90 BASE) MCG/ACT inhaler, Inhale 2 puffs into the lungs 4 (four) times daily as needed for wheezing or shortness of breath., Disp: 18 g, Rfl: 11 .  atorvastatin (LIPITOR) 40 MG tablet, Take 1 tablet (40 mg total) by mouth daily., Disp:  90 tablet, Rfl: 1 .  baclofen (LIORESAL) 10 MG tablet, TK 1 T PO TID, Disp: , Rfl: 0 .  blood glucose meter kit and supplies KIT, Use to test blood sugar once a day, Disp: 1 each, Rfl: 0 .  butalbital-acetaminophen-caffeine (FIORICET, ESGIC) 50-325-40 MG tablet, Take one tablet not more then every 12 hours only for migraine.Do not use on a frequent basis., Disp: 14 tablet, Rfl: 1 .  cetirizine (ZYRTEC) 10 MG tablet, Take 10 mg by mouth daily., Disp: , Rfl:  .  DULoxetine (CYMBALTA) 30 MG capsule, TAKE 1 CAPSULE BY ORAL ROUTE 2 TIMES EVERY DAY, Disp: , Rfl: 2 .  glimepiride (AMARYL) 2 MG tablet, Take 1 tablet (2 mg total) by mouth daily with breakfast., Disp: 90 tablet, Rfl: 3 .  lisinopril-hydrochlorothiazide (PRINZIDE,ZESTORETIC) 10-12.5 MG per tablet, Take 1 tablet by mouth daily., Disp: 90 tablet, Rfl: 1 .  montelukast (SINGULAIR) 10 MG  tablet, Take 1 tablet (10 mg total) by mouth daily., Disp: 90 tablet, Rfl: 3 .  omeprazole-sodium bicarbonate (ZEGERID) 40-1100 MG per capsule, Take 1 capsule by mouth daily., Disp: 90 capsule, Rfl: 3 .  Probiotic Product (PROBIOTIC PO), Take by mouth daily., Disp: , Rfl:  .  doxycycline (VIBRAMYCIN) 100 MG capsule, Take 1 capsule (100 mg total) by mouth 2 (two) times daily., Disp: 20 capsule, Rfl: 0 .  predniSONE (DELTASONE) 20 MG tablet, Take 2 tablets (40 mg total) by mouth daily with breakfast., Disp: 8 tablet, Rfl: 0  EXAM:  Filed Vitals:   11/29/15 1304  BP: 120/90  Pulse: 113  Temp: 98.7 F (37.1 C)    Body mass index is 29.91 kg/(m^2).  GENERAL: vitals reviewed and listed above, alert, oriented, appears well hydrated and in no acute distress  HEENT: atraumatic, conjunttiva clear, no obvious abnormalities on inspection of external nose and ears, normal appearance of ear canals and TMs, white nasal congestion w/ L maxillary sinus TTP, mild post oropharyngeal erythema with PND, no tonsillar edema or exudate  NECK: no obvious masses on inspection  LUNGS: clear to auscultation bilaterally, no wheezes, rales or rhonchi, good air movement  CV: HRRR, no peripheral edema  MS: moves all extremities without noticeable abnormality  PSYCH: pleasant and cooperative, no obvious depression or anxiety  ASSESSMENT AND PLAN:  Discussed the following assessment and plan:  Acute maxillary sinusitis, recurrence not specified  Asthma, mild intermittent, uncomplicated  Essential hypertension  -We discussed potential etiologies, with a recent upper respiratory infection   now with secondary bacterial sinusitis and asthma exacerbation being most likely. Decided to treat with steroids and antibiotic. We discussed treatment side effects, likely course, antibiotic misuse, transmission, and signs of developing a serious illness. -of course, we advised to return or notify a doctor immediately if  symptoms worsen or persist or new concerns arise.    Patient Instructions  Please keep your scheduled follow-up in March.  Start the prednisone and the antibiotic(doxycycline) intake as instructed.  Use her asthma medications as instructed.  Follow-up promptly if worsening or symptoms persist.     Colin Benton R.

## 2015-12-07 ENCOUNTER — Ambulatory Visit: Payer: 59 | Admitting: Family Medicine

## 2015-12-12 ENCOUNTER — Ambulatory Visit (INDEPENDENT_AMBULATORY_CARE_PROVIDER_SITE_OTHER): Payer: BLUE CROSS/BLUE SHIELD | Admitting: Internal Medicine

## 2015-12-12 ENCOUNTER — Telehealth: Payer: Self-pay | Admitting: Family Medicine

## 2015-12-12 ENCOUNTER — Encounter: Payer: Self-pay | Admitting: Internal Medicine

## 2015-12-12 VITALS — BP 116/70 | Temp 97.5°F | Wt 169.4 lb

## 2015-12-12 DIAGNOSIS — Z794 Long term (current) use of insulin: Secondary | ICD-10-CM

## 2015-12-12 DIAGNOSIS — R197 Diarrhea, unspecified: Secondary | ICD-10-CM | POA: Diagnosis not present

## 2015-12-12 DIAGNOSIS — E119 Type 2 diabetes mellitus without complications: Secondary | ICD-10-CM

## 2015-12-12 DIAGNOSIS — R1012 Left upper quadrant pain: Secondary | ICD-10-CM | POA: Diagnosis not present

## 2015-12-12 MED ORDER — DICYCLOMINE HCL 10 MG PO CAPS: ORAL_CAPSULE | ORAL | Status: AC

## 2015-12-12 NOTE — Progress Notes (Signed)
Pre visit review using our clinic review tool, if applicable. No additional management support is needed unless otherwise documented below in the visit note.  Chief Complaint  Patient presents with  . Diarrhea    stopped the antibiotic     HPI: JERMANI PUND 50 y.o.  Patient HADEEL HILLEBRAND  comes in today for SDA for  new problem evaluation. PCP na today   Send in by team health for acute diarrhea   Recent rx for  Sinusitis laryngiutis  with doxycycline  March 3rd stopped antibiotic  And had taken   8 out of 10 days   Feb 27 onset   Diarrhea   Liquid   Stools  Very frequent 6 x per day  At least  Taken immodium and  Eating breads and bananas . miniwheats and still the same  Feels bad but no sever abd pain . Sometimes  Bowel urgency  And rapid  tylenol arthritis  And advil.   Nausea no vomiting.  No fever blood  On probiotic  Has diabetes  incontrolBG. has been ok .  Bentyl    Would like refill of old rx .  For ibs in past  This doesn't seem like ibs  Usually constipated  Kids have strep and bladder infection.  ROS: See pertinent positives and negatives per HPI. 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 ALLERGIC RHINITIS 10/08/2007  . ASTHMA 04/22/2007  . GERD 04/22/2007  . Headache(784.0) 10/08/2007  . HYPERTENSION 04/22/2007  . HYPERLIPIDEMIA 04/22/2007  . LOW BACK PAIN 06/27/2008  . LUQ PAIN 09/06/2010  . NAUSEA 09/06/2010  . IBS (irritable bowel syndrome)     constipation predominant  . PONV (postoperative nausea and vomiting)   . Diabetes mellitus without complication ([place])   . Arthritis     Family History  Problem Relation Age of Onset  . Hypertension Mother   . Arthritis Mother     OA s/p bilateral hip replacement  . Diabetes Father   . Heart disease Father     CAD  . Hyperlipidemia Father   . Hypertension Father   . Pulmonary fibrosis Father   . Osteopenia Sister     older with GI problems s/p colectomy  . Cancer Maternal  Grandmother     Ovarian Cancer  . Osteopenia Sister     Social History   Social History  . Marital Status: Married    Spouse Name: N/A  . Number of Children: 2  . Years of Education: 16   Occupational History  . substitute teacher [employer]   Social History Main Topics  . Smoking status: Never Smoker   . Smokeless tobacco: Never Used  . Alcohol Use: No  . Drug Use: No  . Sexual Activity:    Partners: Male   Other Topics Concern  . None   Social History Narrative   Updated 08/09/2015   Work or School: Now retired as of 07/2015; prior work: Hydrologist, part Theatre manager      Home Situation: son with special needs, sick a lot also 17 yo daughter      Spiritual Beliefs: christian      Lifestyle: no regular exercise, diet in so so          Outpatient Prescriptions Prior to Visit  Medication Sig Dispense Refill  . acetaminophen (TYLENOL ARTHRITIS PAIN) 650 MG CR tablet Take 650 mg by mouth as needed for pain.    Marland Kitchen  albuterol (PROVENTIL HFA;VENTOLIN HFA) 108 (90 BASE) MCG/ACT inhaler Inhale 2 puffs into the lungs 4 (four) times daily as needed for wheezing or shortness of breath. 18 g 11  . atorvastatin (LIPITOR) 40 MG tablet Take 1 tablet (40 mg total) by mouth daily. 90 tablet 1  . baclofen (LIORESAL) 10 MG tablet TK 1 T PO TID  0  . blood glucose meter kit and supplies KIT Use to test blood sugar once a day 1 each 0  . butalbital-acetaminophen-caffeine (FIORICET, ESGIC) 50-325-40 MG tablet Take one tablet not more then every 12 hours only for migraine.Do not use on a frequent basis. 14 tablet 1  . cetirizine (ZYRTEC) 10 MG tablet Take 10 mg by mouth daily.    . DULoxetine (CYMBALTA) 30 MG capsule TAKE 1 CAPSULE BY ORAL ROUTE 2 TIMES EVERY DAY  2  . glimepiride (AMARYL) 2 MG tablet Take 1 tablet (2 mg total) by mouth daily with breakfast. 90 tablet 3  . lisinopril-hydrochlorothiazide (PRINZIDE,ZESTORETIC) 10-12.5 MG tablet TAKE 1 TABLET BY MOUTH DAILY 30 tablet 1    . montelukast (SINGULAIR) 10 MG tablet Take 1 tablet (10 mg total) by mouth daily. 90 tablet 3  . omeprazole-sodium bicarbonate (ZEGERID) 40-1100 MG per capsule Take 1 capsule by mouth daily. 90 capsule 3  . Probiotic Product (PROBIOTIC PO) Take by mouth daily.    Marland Kitchen doxycycline (VIBRAMYCIN) 100 MG capsule Take 1 capsule (100 mg total) by mouth 2 (two) times daily. 20 capsule 0  . predniSONE (DELTASONE) 20 MG tablet Take 2 tablets (40 mg total) by mouth daily with breakfast. 8 tablet 0   No facility-administered medications prior to visit.     EXAM:  BP 116/70 mmHg  Temp(Src) 97.5 F (36.4 C) (Oral)  Wt 169 lb 6.4 oz (76.839 kg)  Body mass index is 30.02 kg/(m^2).  GENERAL: vitals reviewed and listed above, alert, oriented, appears well hydrated and in no acute distress HEENT: atraumatic, conjunctiva  clear, no obvious abnormalities on inspection of external nose and earsNECK: no obvious masses on inspection palpation  LUNGS: clear to auscultation bilaterally, no wheezes, rales or rhonchi, good air movement CV: HRRR, no clubbing cyanosis or  peripheral edema nl cap refill  Skin facial flush  central obesity habitus   Non icterus  Abdomen:  Sof,t normal bowel sounds without hepatosplenomegaly, no guarding rebound or masses no CVA tenderness MS: moves all extremities without noticeable focal  abnormality PSYCH: pleasant and cooperative,  Normal cognition Lab Results  Component Value Date   WBC 8.6 08/09/2015   HGB 15.9* 08/09/2015   HCT 48.2* 08/09/2015   PLT 220.0 08/09/2015   GLUCOSE 124* 08/09/2015   CHOL 182 12/15/2014   TRIG 141.0 12/15/2014   HDL 54.60 12/15/2014   LDLDIRECT 125.5 10/04/2013   LDLCALC 99 12/15/2014   ALT 28 04/27/2013   AST 31 04/27/2013   NA 139 08/09/2015   K 4.9 08/09/2015   CL 101 08/09/2015   CREATININE 0.78 08/09/2015   BUN 22 08/09/2015   CO2 31 08/09/2015   TSH 1.86 08/30/2010   INR 0.98 04/27/2013   HGBA1C 6.2 08/09/2015   Wt Readings  from Last 3 Encounters:  12/12/15 169 lb 6.4 oz (76.839 kg)  11/29/15 168 lb 12.8 oz (76.567 kg)  08/09/15 160 lb 3.2 oz (72.666 kg)    ASSESSMENT AND PLAN:  Discussed the following assessment and plan:  Diarrhea, unspecified type - Plan: Clostridium Difficile by PCR, Stool culture, Fecal lactoferrin, Giardia/cryptosporidium (EIA)  LUQ  abdominal pain - Plan: dicyclomine (BENTYL) 10 MG capsule  Type 2 diabetes mellitus without complication, with long-term current use of insulin ([place])  -Patient advised to return or notify health care team  if symptoms worsen ,persist or new concerns arise.  Patient Instructions  Stool tests today  Can try bentyl. As needed. If not better plan  See dr Carlean Purl  Or   Dr.  Mindi Junker K. Ellamae Lybeck M.D.

## 2015-12-12 NOTE — Telephone Encounter (Signed)
FYI.  Patient has an appointment. 

## 2015-12-12 NOTE — Patient Instructions (Signed)
Stool tests today  Can try bentyl. As needed. If not better plan  See dr Leone PayorGessner  Or   Dr.  Selena BattenKim

## 2015-12-12 NOTE — Telephone Encounter (Signed)
Patient Name: Olivia CrewsROBIN Deal  DOB: [birth year]    Initial Comment Caller states she started abx a couple of weeks ago, has been having diarrhea for 10 days.   Nurse Assessment  Nurse: Sherilyn CooterHenry, RN, Thurmond ButtsWade Date/Time Lamount Cohen(Eastern Time): 12/12/2015 9:07:56 AM  Confirm and document reason for call. If symptomatic, describe symptoms. You must click the next button to save text entered. ---Caller states that she was put on an antibiotic about 14 days ago, Doxycycline for a sinus infection. She took it for 8 days due to diarrhea. She states her diarrhea has become worse. She has had diarrhea x 10-12 in the last 24 hours. She last urinated this morning.  Has the patient traveled out of the country within the last 30 days? ---No  Does the patient have any new or worsening symptoms? ---Yes  Will a triage be completed? ---Yes  Related visit to physician within the last 2 weeks? ---No  Does the PT have any chronic conditions? (i.e. diabetes, asthma, etc.) ---Yes  List chronic conditions. ---Asthma, diabetes, HTN, Hypercholesterolemia, IBS  Is the patient pregnant or possibly pregnant? (Ask all females between the ages of 4912-55) ---No  Is this a behavioral health or substance abuse call? ---No     Guidelines    Guideline Title Affirmed Question Affirmed Notes  Diarrhea [1] SEVERE diarrhea (e.g., 7 or more times / day more than normal) AND [2] present > 24 hours (1 day)    Final Disposition User   See Physician within 24 Hours Sherilyn CooterHenry, RN, Thurmond ButtsWade    Comments  Dr. Selena BattenKim, listed as PCP, did not have any appointments available. I scheduled an appointment with Dr. Berniece AndreasWanda Panosh for 4:00pm today.   Referrals  REFERRED TO PCP OFFICE   Disagree/Comply: Comply

## 2015-12-14 ENCOUNTER — Other Ambulatory Visit: Payer: Self-pay | Admitting: Internal Medicine

## 2015-12-15 LAB — FECAL LACTOFERRIN, QUANT: LACTOFERRIN: POSITIVE

## 2015-12-15 LAB — CLOSTRIDIUM DIFFICILE BY PCR: Toxigenic C. Difficile by PCR: NOT DETECTED

## 2015-12-19 LAB — STOOL CULTURE

## 2015-12-21 LAB — GIARDIA/CRYPTOSPORIDIUM (EIA)

## 2015-12-24 ENCOUNTER — Encounter: Payer: Self-pay | Admitting: Family Medicine

## 2015-12-24 NOTE — Telephone Encounter (Addendum)
Scarlet fever is caused byThe same germ that causes strep throat .  GP A Beta streptococcus  Seek care if sore throat or skin infection signs  Or rash otherwise wouldn't be concerned  For her health,  In regard to the gi sx   It appears that not all the tests were completed  .  If not better can get the giardia crypto  Test ( the other not helpful for  Non diarrheal stools)

## 2016-01-04 ENCOUNTER — Encounter: Payer: Self-pay | Admitting: Family Medicine

## 2016-01-04 ENCOUNTER — Telehealth: Payer: Self-pay | Admitting: Family Medicine

## 2016-01-04 ENCOUNTER — Ambulatory Visit (INDEPENDENT_AMBULATORY_CARE_PROVIDER_SITE_OTHER): Payer: BLUE CROSS/BLUE SHIELD | Admitting: Family Medicine

## 2016-01-04 VITALS — BP 112/88 | HR 99 | Temp 98.4°F | Ht 63.0 in | Wt 174.1 lb

## 2016-01-04 DIAGNOSIS — J4541 Moderate persistent asthma with (acute) exacerbation: Secondary | ICD-10-CM | POA: Diagnosis not present

## 2016-01-04 DIAGNOSIS — I10 Essential (primary) hypertension: Secondary | ICD-10-CM | POA: Diagnosis not present

## 2016-01-04 MED ORDER — FLUTICASONE PROPIONATE HFA 44 MCG/ACT IN AERO: 2.0000 | INHALATION_SPRAY | Freq: Two times a day (BID) | RESPIRATORY_TRACT | Status: DC

## 2016-01-04 MED ORDER — HYDROCHLOROTHIAZIDE 25 MG PO TABS: 25.0000 mg | ORAL_TABLET | Freq: Every day | ORAL | Status: DC

## 2016-01-04 MED ORDER — PREDNISONE 10 MG PO TABS: ORAL_TABLET | ORAL | Status: DC

## 2016-01-04 NOTE — Telephone Encounter (Signed)
Patient Name: Olivia CrewsROBIN Trevathan DOB: 1966-06-24 Initial Comment Caller states her asthma symptoms are getting worse, cant stop coughing, using inhaler several times a day. Nurse Assessment Nurse: Yetta BarreJones, RN, Olivia Date/Time (Eastern Time): 01/04/2016 2:13:41 PM Confirm and document reason for call. If symptomatic, describe symptoms. You must click the next button to save text entered. ---Caller states she has had a cough for the last 3 days. She has been having to use her inhaler 4-5 times\day (last dose a couple hrs ago). Has the patient traveled out of the country within the last 30 days? ---No Does the patient have any new or worsening symptoms? ---Yes Will a triage be completed? ---Yes Related visit to physician within the last 2 weeks? ---No Does the PT have any chronic conditions? (i.e. diabetes, asthma, etc.) ---Yes List chronic conditions. ---Asthma, Diabetes, High Cholesterol Is the patient pregnant or possibly pregnant? (Ask all females between the ages of 4012-55) ---No Is this a behavioral health or substance abuse call? ---No Guidelines Guideline Title Affirmed Question Affirmed Notes Asthma Attack [1] MILD asthma attack (e.g., no SOB at rest, mild SOB with walking, speaks normally in sentences, mild wheezing) AND [2] persists > 24 hours on appropriate treatment Final Disposition User See Physician within 24 Hours Brown, Charity fundraiserN, BB&T CorporationMiranda Comments Called backline and spoke with [name]. She said it was ok to schedule pt for 4:15 fast track appt with Dr. Selena Brown Referrals REFERRED TO PCP OFFICE Disagree/Comply: Comply

## 2016-01-04 NOTE — Telephone Encounter (Signed)
Patient was seen today by Dr.Kim.

## 2016-01-04 NOTE — Patient Instructions (Signed)
Before you leave: -Schedule a follow-up visit in 2 months  Stop the combination of blood pressure medicine ( hydrochlorothiazide-lisinopril). Start the hydrochlorothiazide 25 mg once daily.  Take the prednisone taper as instructed.  Start the Rohm and HaasFlovent, this is an inhaled steroid to be used twice daily to prevent asthma attacks.  Follow-up symptoms worsen, new symptoms develop or you have persistent symptoms despite treatment.

## 2016-01-04 NOTE — Progress Notes (Signed)
HPI:  [name] Is a pleasant 50 year old with a PMH significant for allergic rhinitis, asthma, gerd, DM, HLD, HTN and obesity  here for an acute visit for cough and wheezing. She reports this started about 3 days ago with nasal congestion, postnasal drip and coughing. This has worsened and she is experiencing wheezing, chest tightness, and has required increased albuterol use. Denies fevers, chills, hemoptysis, nausea, vomiting, diarrhea, body aches. Her chronic allergy and asthma medications that she is taking currently include Zyrtec, Singulair and as needed albuterol. She is on Zegerid for her acid. She is on an ACE inhibitor for her blood pressure. This is her third asthma exacerbation in the last year. She used her albuterol prior to coming here and is feeling a little better. Feels like her prior PCP gave her a longer steroid courses when she had these symptoms and that longer course worked better. On Flovent in the past, but stopped when has not had symptoms in some time.    ROS: See pertinent positives and negatives per HPI.  Past Medical History  Diagnosis Date  . ALLERGIC RHINITIS 10/08/2007  . ASTHMA 04/22/2007  . GERD 04/22/2007  . Headache(784.0) 10/08/2007  . HYPERTENSION 04/22/2007  . HYPERLIPIDEMIA 04/22/2007  . LOW BACK PAIN 06/27/2008  . LUQ PAIN 09/06/2010  . NAUSEA 09/06/2010  . IBS (irritable bowel syndrome)     constipation predominant  . PONV (postoperative nausea and vomiting)   . Diabetes mellitus without complication ([place])   . Arthritis     Past Surgical History  Procedure Laterality Date  . Knee surgery  92, 96, 01, 08    right  . Knee surgery  88    left  . Nasal polyp surgery  95    sinus  . Lumbar fusion  05    L4-5 S1  . Tubal ligation    . Cesarean section    . Knee arthroplasty Right 04/29/2013    Procedure: COMPUTER ASSISTED TOTAL KNEE ARTHROPLASTY;  Surgeon: Marybelle Killings, MD;  Location: [facility];  Service: Orthopedics;  Laterality: Right;  Right  Total Knee Arthroplasty, Cemented    Family History  Problem Relation Age of Onset  . Hypertension Mother   . Arthritis Mother     OA s/p bilateral hip replacement  . Diabetes Father   . Heart disease Father     CAD  . Hyperlipidemia Father   . Hypertension Father   . Pulmonary fibrosis Father   . Osteopenia Sister     older with GI problems s/p colectomy  . Cancer Maternal Grandmother     Ovarian Cancer  . Osteopenia Sister     Social History   Social History  . Marital Status: Married    Spouse Name: N/A  . Number of Children: 2  . Years of Education: 16   Occupational History  . substitute teacher [employer]   Social History Main Topics  . Smoking status: Never Smoker   . Smokeless tobacco: Never Used  . Alcohol Use: No  . Drug Use: No  . Sexual Activity:    Partners: Male   Other Topics Concern  . None   Social History Narrative   Updated 08/09/2015   Work or School: Now retired as of 07/2015; prior work: Hydrologist, part Theatre manager      Home Situation: son with special needs, sick a lot also 24 yo daughter      Spiritual Beliefs: christian      Lifestyle:  no regular exercise, diet in so so           Current outpatient prescriptions:  .  acetaminophen (TYLENOL ARTHRITIS PAIN) 650 MG CR tablet, Take 650 mg by mouth as needed for pain., Disp: , Rfl:  .  albuterol (PROVENTIL HFA;VENTOLIN HFA) 108 (90 BASE) MCG/ACT inhaler, Inhale 2 puffs into the lungs 4 (four) times daily as needed for wheezing or shortness of breath., Disp: 18 g, Rfl: 11 .  atorvastatin (LIPITOR) 40 MG tablet, Take 1 tablet (40 mg total) by mouth daily., Disp: 90 tablet, Rfl: 1 .  baclofen (LIORESAL) 10 MG tablet, TK 1 T PO TID, Disp: , Rfl: 0 .  blood glucose meter kit and supplies KIT, Use to test blood sugar once a day, Disp: 1 each, Rfl: 0 .  butalbital-acetaminophen-caffeine (FIORICET, ESGIC) 50-325-40 MG tablet, Take one tablet not more then every 12 hours only for  migraine.Do not use on a frequent basis., Disp: 14 tablet, Rfl: 1 .  cetirizine (ZYRTEC) 10 MG tablet, Take 10 mg by mouth daily., Disp: , Rfl:  .  dicyclomine (BENTYL) 10 MG capsule, Take 1 tab at bedtime.or as directed, Disp: 30 capsule, Rfl: 0 .  DULoxetine (CYMBALTA) 30 MG capsule, TAKE 1 CAPSULE BY ORAL ROUTE 2 TIMES EVERY DAY, Disp: , Rfl: 2 .  glimepiride (AMARYL) 2 MG tablet, Take 1 tablet (2 mg total) by mouth daily with breakfast., Disp: 90 tablet, Rfl: 3 .  montelukast (SINGULAIR) 10 MG tablet, Take 1 tablet (10 mg total) by mouth daily., Disp: 90 tablet, Rfl: 3 .  omeprazole-sodium bicarbonate (ZEGERID) 40-1100 MG per capsule, Take 1 capsule by mouth daily., Disp: 90 capsule, Rfl: 3 .  Probiotic Product (PROBIOTIC PO), Take by mouth daily., Disp: , Rfl:  .  fluticasone (FLOVENT HFA) 44 MCG/ACT inhaler, Inhale 2 puffs into the lungs 2 (two) times daily., Disp: 1 Inhaler, Rfl: 12 .  hydrochlorothiazide (HYDRODIURIL) 25 MG tablet, Take 1 tablet (25 mg total) by mouth daily., Disp: 90 tablet, Rfl: 3 .  predniSONE (DELTASONE) 10 MG tablet, 34m (4 tablets) x 2 days, then 371m(2tabs) x 2 days, then 2086m2 tabs) x 2 days then 42m75mtab) x 2days, Disp: 20 tablet, Rfl: 0  EXAM:  Filed Vitals:   01/04/16 1607  BP: 112/88  Pulse: 99  Temp: 98.4 F (36.9 C)    Body mass index is 30.85 kg/(m^2).  GENERAL: vitals reviewed and listed above, alert, oriented, appears well hydrated and in no acute distress  HEENT: atraumatic, conjunttiva clear, no obvious abnormalities on inspection of external nose and ears, normal appearance of ear canals and TMs, clear nasal congestion, mild post oropharyngeal erythema with PND, no tonsillar edema or exudate, no sinus TTP  NECK: no obvious masses on inspection  LUNGS: clear to auscultation bilaterally, no wheezes, rales or rhonchi, good air movement, O2 sats normal.  CV: HRRR, no peripheral edema  MS: moves all extremities without noticeable  abnormality  PSYCH: pleasant and cooperative, no obvious depression or anxiety  ASSESSMENT AND PLAN:  Discussed the following assessment and plan:  Essential hypertension  Asthma with acute exacerbation, moderate persistent  -likely viral upper respiratory infection with asthma exacerbation -given recurrent symptoms, we'll stop the ACE inhibitor; increased hydrochlorothiazide to compensate -Steroid taper -Restart inhaled steroid -Follow up chronic medical disease in 1-2 months -She was advised to return or notify a doctor immediately if symptoms worsen or persist or new concerns arise.  Patient Instructions  Before you leave: -  Schedule a follow-up visit in 2 months  Stop the combination of blood pressure medicine ( hydrochlorothiazide-lisinopril). Start the hydrochlorothiazide 25 mg once daily.  Take the prednisone taper as instructed.  Start the [employer], this is an inhaled steroid to be used twice daily to prevent asthma attacks.  Follow-up symptoms worsen, new symptoms develop or you have persistent symptoms despite treatment.     Colin Benton R.

## 2016-01-04 NOTE — Telephone Encounter (Signed)
Pt scheduled today with Dr. Kim.

## 2016-01-04 NOTE — Progress Notes (Signed)
Pre visit review using our clinic review tool, if applicable. No additional management support is needed unless otherwise documented below in the visit note. 

## 2016-01-27 ENCOUNTER — Other Ambulatory Visit: Payer: Self-pay | Admitting: Family Medicine

## 2016-02-08 ENCOUNTER — Encounter: Payer: Self-pay | Admitting: Internal Medicine

## 2016-02-08 ENCOUNTER — Ambulatory Visit (INDEPENDENT_AMBULATORY_CARE_PROVIDER_SITE_OTHER): Payer: BLUE CROSS/BLUE SHIELD | Admitting: Internal Medicine

## 2016-02-08 ENCOUNTER — Other Ambulatory Visit (INDEPENDENT_AMBULATORY_CARE_PROVIDER_SITE_OTHER): Payer: BLUE CROSS/BLUE SHIELD

## 2016-02-08 VITALS — BP 140/100 | HR 93 | Ht 63.0 in | Wt 175.0 lb

## 2016-02-08 DIAGNOSIS — K589 Irritable bowel syndrome without diarrhea: Secondary | ICD-10-CM

## 2016-02-08 DIAGNOSIS — R194 Change in bowel habit: Secondary | ICD-10-CM | POA: Diagnosis not present

## 2016-02-08 LAB — IGA: IGA: 73 mg/dL (ref 68–378)

## 2016-02-08 NOTE — Progress Notes (Signed)
 Subjective:    Patient ID: [name], female    DOB: 12/15/1965, 50 y.o.   MRN: [id number] Cc: diarrhea HPI  [name] has had a lot of problems with diarrhea when treated with medications for her back pain lately. She has IBS with some diarrhea hx but mostly constipated. Has been trying to avoid NSAID's and has tried gabapentin and duloxetine in past several months and is currently on nortriptyline. When on gabapentin and especially duloxetine she has had bad urgent diarrhea. Tried duloxetine twice and both times  Had bad diarrhea. Still has intermittent LUQ pain as she has over years.  Right now fairly regular defecation. Nortriptyline seems to be depressing her mood and not much help w/ pain.  Duloxetine really seemed to help pain and associated irritability and anxiety. She is disappointed she had diarrhea with it.  Has a lot of stressors with disabled son. 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 Allergies  Allergen Reactions  . Metformin And Related Nausea Only    nausea  . Onglyza [Saxagliptin] Nausea Only  . Penicillins     Severe reaction - hospitlaized   Outpatient Prescriptions Prior to Visit  Medication Sig Dispense Refill  . albuterol (PROVENTIL HFA;VENTOLIN HFA) 108 (90 BASE) MCG/ACT inhaler Inhale 2 puffs into the lungs 4 (four) times daily as needed for wheezing or shortness of breath. 18 g 11  . atorvastatin (LIPITOR) 40 MG tablet Take 1 tablet (40 mg total) by mouth daily. 90 tablet 1  . baclofen (LIORESAL) 10 MG tablet TK 1 T PO TID  0  . blood glucose meter kit and supplies KIT Use to test blood sugar once a day 1 each 0  . butalbital-acetaminophen-caffeine (FIORICET, ESGIC) 50-325-40 MG tablet Take one tablet not more then every 12 hours only for migraine.Do not use on a frequent basis. 14 tablet 1  . cetirizine (ZYRTEC) 10 MG tablet Take 10 mg by mouth daily.    . dicyclomine (BENTYL) 10 MG capsule Take 1 tab at bedtime.or as directed 30 capsule  0  . fluticasone (FLOVENT HFA) 44 MCG/ACT inhaler Inhale 2 puffs into the lungs 2 (two) times daily. 1 Inhaler 12  . glimepiride (AMARYL) 2 MG tablet Take 1 tablet (2 mg total) by mouth daily with breakfast. 90 tablet 3  . hydrochlorothiazide (HYDRODIURIL) 25 MG tablet Take 1 tablet (25 mg total) by mouth daily. 90 tablet 3  . montelukast (SINGULAIR) 10 MG tablet Take 1 tablet (10 mg total) by mouth daily. 90 tablet 3  . omeprazole-sodium bicarbonate (ZEGERID) 40-1100 MG per capsule Take 1 capsule by mouth daily. 90 capsule 3  . Probiotic Product (PROBIOTIC PO) Take by mouth daily.    . acetaminophen (TYLENOL ARTHRITIS PAIN) 650 MG CR tablet Take 650 mg by mouth as needed for pain.    . DULoxetine (CYMBALTA) 30 MG capsule TAKE 1 CAPSULE BY ORAL ROUTE 2 TIMES EVERY DAY  2  . predniSONE (DELTASONE) 10 MG tablet 40mg (4 tablets) x 2 days, then 30mg (2tabs) x 2 days, then 20mg (2 tabs) x 2 days then 10mg (1tab) x 2days 20 tablet 0   No facility-administered medications prior to visit.   Past Medical History  Diagnosis Date  . ALLERGIC RHINITIS 10/08/2007  . ASTHMA 04/22/2007  . GERD 04/22/2007  . Headache(784.0) 10/08/2007  . HYPERTENSION 04/22/2007  . HYPERLIPIDEMIA 04/22/2007  . LOW BACK PAIN 06/27/2008  . LUQ PAIN 09/06/2010  . NAUSEA 09/06/2010  . IBS (irritable   bowel syndrome)     constipation predominant  . PONV (postoperative nausea and vomiting)   . Diabetes mellitus without complication ([place])   . Arthritis    Past Surgical History  Procedure Laterality Date  . Knee surgery  92, 96, 01, 08    right  . Knee surgery  88    left  . Nasal polyp surgery  95    sinus  . Lumbar fusion  05    L4-5 S1  . Tubal ligation    . Cesarean section    . Knee arthroplasty Right 04/29/2013    Procedure: COMPUTER ASSISTED TOTAL KNEE ARTHROPLASTY;  Surgeon: Olivia Killings, MD;  Location: [facility];  Service: Orthopedics;  Laterality: Right;  Right Total Knee Arthroplasty, Cemented   Social History    Social History  . Marital Status: Married    Spouse Name: N/A  . Number of Children: 2  . Years of Education: 16   Occupational History  . substitute teacher [employer]   Social History Main Topics  . Smoking status: Never Smoker   . Smokeless tobacco: Never Used  . Alcohol Use: No  . Drug Use: No  . Sexual Activity:    Partners: Male   Other Topics Concern  . 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 Hypertension Mother   . Arthritis Mother     OA s/p bilateral hip replacement  . Diabetes Father   . Heart disease Father     CAD  . Hyperlipidemia Father   . Hypertension Father   . Pulmonary fibrosis Father   . Osteopenia Sister     older with GI problems s/p colectomy  . Cancer Maternal Grandmother     Ovarian Cancer  . Osteopenia Sister        Review of Systems As above - seeing Dr. Maryjean Brown    Objective:   Physical Exam  _0  140/100 mmHg  Pulse 93  Ht 5' 3" (1.6 m)  Wt 175 lb (79.379 kg)  BMI 31.01 kg/m2  LMP 08/10/2014@  General:  NAD Eyes:   anicteric Lungs:  clear Abdomen:  soft and nontender, BS+ Ext:   no edema, cyanosis or clubbing Data Reviewed:  Prior clonoscopy 2010 normal except external hemorrhoids     Assessment & Plan:   Encounter Diagnoses  Name Primary?  . IBS (irritable bowel syndrome) Yes  . Altered bowel habits    She is struggling with back pain and trying to treat it - seems like the diarrhea is side effect and not IBS - has had spells of diarrhea in past in fact that is why last colonoscopy was done 2010  Will check celiac serologies as not done in past ? If Effexor XR makes sense since  Cymbalta seemed helpful but caused diarrhea She will ask Dr. Maryjean Brown  Cc: Olivia Hakim, MD [name]., DO

## 2016-02-08 NOTE — Patient Instructions (Signed)
  Your physician has requested that you go to the basement for the  lab work before leaving today.    I appreciate the opportunity to care for you.   

## 2016-02-11 LAB — TISSUE TRANSGLUTAMINASE, IGA: Tissue Transglutaminase Ab, IgA: 1 U/mL (ref <4)

## 2016-02-12 ENCOUNTER — Telehealth: Payer: Self-pay | Admitting: Family Medicine

## 2016-02-12 DIAGNOSIS — I10 Essential (primary) hypertension: Secondary | ICD-10-CM

## 2016-02-12 MED ORDER — LOSARTAN POTASSIUM 50 MG PO TABS: 50.0000 mg | ORAL_TABLET | Freq: Every day | ORAL | Status: DC

## 2016-02-12 NOTE — Telephone Encounter (Signed)
[facility] - Client TELEPHONE ADVICE RECORD [facility]  Patient Name: Olivia CrewsROBIN Brown  DOB: [birth year]    Initial Comment Caller states bp has been in the following range: 145-165/105-110.Marland Kitchen.Marland Kitchen.bp now is 165/105.    Nurse Assessment  Nurse: Olivia BenesJohnson, RN, [name] Date/Time [name](Eastern Time): 02/12/2016 4:29:18 PM  Confirm and document reason for call. If symptomatic, describe symptoms. You must click the next button to save text entered. ---Zella BallRobin is having blood pressure readings that are high -- March was in to see MD for URI and blood pressure was elevated blood pressure medication changed  Has the patient traveled out of the country within the last 30 days? ---No  Does the patient have any new or worsening symptoms? ---Yes  Will a triage be completed? ---Yes  Related visit to physician within the last 2 weeks? ---No  Does the PT have any chronic conditions? (i.e. diabetes, asthma, etc.) ---No  Is the patient pregnant or possibly pregnant? (Ask all females between the ages of 4212-55) ---No  Is this a behavioral health or substance abuse call? ---No     Guidelines    Guideline Title Affirmed Question Affirmed Notes  High Blood Pressure BP ? 160/100    Final Disposition User   See PCP When Office is Open (within 3 days) [name], Charity fundraiserN, [name]    Comments  Refused appt does not feel she needs to be seen has been dealing with her HTN since age of 50 yrs and knows what works and doesn't -- her medication that she was on prior to MD changing it in March was working the HCTZ she has her on now is not working been to [facility] doctors in two days and readings were all high and reading at home high. Wants to have something back on board that works.   Referrals  REFERRED TO PCP OFFICE   Disagree/Comply: Disagree  Disagree/Comply Reason: Disagree with instructions

## 2016-02-12 NOTE — Telephone Encounter (Signed)
We stop one of her medications because it was possibly worsening her asthma. She was supposed to follow up to recheck and make sure the change in regimen was working. Could add back another medication. Start losartan 50mg  once daily.  Sent to pharmacy - it is similar to the medication she was taking but not likely to worsen asthma like the lisinopril. Need OV in next 1 month to recheck and follow up on chronic medical problems.

## 2016-02-12 NOTE — Telephone Encounter (Signed)
I left a message for the pt to return my call at her cell number and with her husband to have her call back as he is not listed on the DPR.

## 2016-02-12 NOTE — Telephone Encounter (Signed)
Pt requesting change in medication

## 2016-02-13 ENCOUNTER — Telehealth: Payer: Self-pay | Admitting: Family Medicine

## 2016-02-13 NOTE — Telephone Encounter (Signed)
Left message on machine for patient to return our call 

## 2016-02-13 NOTE — Telephone Encounter (Signed)
Pt returned your call.  

## 2016-02-14 NOTE — Telephone Encounter (Signed)
I called the pt and informed her of the message from [facility] that Dr Selena BattenKim responded to.  She is aware Losartan was sent to her pharmacy and stated she will call back for a follow up visit.

## 2016-02-14 NOTE — Progress Notes (Signed)
Quick Note:  No celiac My Chart message ______

## 2016-02-28 ENCOUNTER — Other Ambulatory Visit: Payer: Self-pay | Admitting: Family Medicine

## 2016-03-20 ENCOUNTER — Ambulatory Visit (INDEPENDENT_AMBULATORY_CARE_PROVIDER_SITE_OTHER): Payer: BLUE CROSS/BLUE SHIELD | Admitting: Family Medicine

## 2016-03-20 ENCOUNTER — Encounter: Payer: Self-pay | Admitting: Family Medicine

## 2016-03-20 VITALS — BP 154/108 | HR 107 | Temp 98.2°F | Ht 63.0 in | Wt 174.6 lb

## 2016-03-20 DIAGNOSIS — E119 Type 2 diabetes mellitus without complications: Secondary | ICD-10-CM | POA: Diagnosis not present

## 2016-03-20 DIAGNOSIS — Z658 Other specified problems related to psychosocial circumstances: Secondary | ICD-10-CM | POA: Diagnosis not present

## 2016-03-20 DIAGNOSIS — I10 Essential (primary) hypertension: Secondary | ICD-10-CM

## 2016-03-20 DIAGNOSIS — F439 Reaction to severe stress, unspecified: Secondary | ICD-10-CM

## 2016-03-20 MED ORDER — LOSARTAN POTASSIUM 100 MG PO TABS: 100.0000 mg | ORAL_TABLET | Freq: Every day | ORAL | Status: DC

## 2016-03-20 NOTE — Patient Instructions (Addendum)
BEFORE YOU LEAVE: -obtain lab report from Oliva BustardJoyce Eury for [id number] -schedule physical exam and follow up in 1 month; please com fasting and will plan to check labs and recheck blood pressure  Increase the losartan to 100mg ; call with blood pressure readings in 2 weeks.  We recommend the following healthy lifestyle measures: - eat a healthy whole foods diet consisting of regular small meals composed of vegetables, fruits, beans, nuts, seeds, healthy meats such as white chicken and fish and whole grains.  - avoid sweets, white starchy foods, fried foods, fast food, processed foods, sodas, red meet and other fattening foods.  - get a least 150-300 minutes of aerobic exercise per week.

## 2016-03-20 NOTE — Progress Notes (Signed)
Pre visit review using our clinic review tool, if applicable. No additional management support is needed unless otherwise documented below in the visit note. 

## 2016-03-20 NOTE — Progress Notes (Signed)
HPI:  Follow up HTN: -meds: hctz 76m, losartan 569m(stoped acei due to resp issues) -reports: She is very stressed right now and that is why her blood pressure and heart rate are up - her husband told her he wanted a divorce yesterday, this was a surprise Reports she did take her blood pressure medications today -Reports history of not being able to take beta blockers due to her asthma, history of taking many other medications in the past for her blood pressure but can't remember which ones -denies:Chest pain, shortness of breath, headache  PMH significant for allergic rhinitis, asthma, gerd, DM, HLD, HTN and obesity. Poor compliance with follow up recently. Due for labs and foot exam. She did not want to do labs today and we spent a lot of her visit discussing her situation with her husband. She sees a coSocial workernd does not feel she needs further help with dealing with the stress. She does have strong faith. She refuses labs today as reports she [facility]. She reports her hemoglobin A1c was 6.8. I will have my assistant request these lab results from their office. Provider she saw was named JoArmida Sans  ROS: See pertinent positives and negatives per HPI.  Past Medical History  Diagnosis Date  . ALLERGIC RHINITIS 10/08/2007  . ASTHMA 04/22/2007  . GERD 04/22/2007  . Headache(784.0) 10/08/2007  . HYPERTENSION 04/22/2007  . HYPERLIPIDEMIA 04/22/2007  . LOW BACK PAIN 06/27/2008  . LUQ PAIN 09/06/2010  . NAUSEA 09/06/2010  . IBS (irritable bowel syndrome)     constipation predominant  . PONV (postoperative nausea and vomiting)   . [place]  . Arthritis     Past Surgical History  Procedure Laterality Date  . Knee surgery  92, 96, 01, 08    right  . Knee surgery  88    left  . Nasal polyp surgery  95    sinus  . Lumbar fusion  05    L4-5 S1  . Tubal ligation    . Cesarean section    . Knee arthroplasty Right  04/29/2013    Procedure: COMPUTER ASSISTED TOTAL KNEE ARTHROPLASTY;  Surgeon: MaMarybelle KillingsMD;  [facility];  Laterality: Right;  Right Total Knee Arthroplasty, Cemented    Family History  Problem Relation Age of Onset  . Hypertension Mother   . Arthritis Mother     OA s/p bilateral hip replacement  . Diabetes Father   . Heart disease Father     CAD  . Hyperlipidemia Father   . Hypertension Father   . Pulmonary fibrosis Father   . Osteopenia Sister     older with GI problems s/p colectomy  . Cancer Maternal Grandmother     Ovarian Cancer  . Osteopenia Sister     Social History   Social History  . Marital Status: Married    Spouse Name: N/A  . Number of Children: 2  . Years of Education: 16   Occupational History  . 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 Smoking status: Never Smoker   . Smokeless tobacco: Never Used  . Alcohol Use: No  . Drug Use: No  . Sexual Activity:    Partners: Male   Other Topics Concern  . None   Social History Narrative   Updated 08/09/2015   Work or School: Now retired as of 07/2015; prior work: UNHydrologistpart  time teacher      Home Situation: son with special needs, sick a lot also 22 yo daughter      Spiritual Beliefs: christian      Lifestyle: no regular exercise, diet in so so           Current outpatient prescriptions:  .  Acetaminophen (TYLENOL ARTHRITIS PAIN PO), Take 2 tablets by mouth 2 (two) times daily., Disp: , Rfl:  .  albuterol (PROVENTIL HFA;VENTOLIN HFA) 108 (90 BASE) MCG/ACT inhaler, Inhale 2 puffs into the lungs 4 (four) times daily as needed for wheezing or shortness of breath., Disp: 18 g, Rfl: 11 .  atorvastatin (LIPITOR) 40 MG tablet, TAKE 1 TABLET BY MOUTH DAILY, Disp: 30 tablet, Rfl: 4 .  baclofen (LIORESAL) 10 MG tablet, TK 1 T PO TID, Disp: , Rfl: 0 .  blood glucose meter kit and supplies KIT, Use to test blood sugar once a day, Disp: 1 each, Rfl:  0 .  butalbital-acetaminophen-caffeine (FIORICET, ESGIC) 50-325-40 MG tablet, Take one tablet not more then every 12 hours only for migraine.Do not use on a frequent basis., Disp: 14 tablet, Rfl: 1 .  cetirizine-pseudoephedrine (ZYRTEC-D) 5-120 MG tablet, Take 1 tablet by mouth., Disp: , Rfl:  .  dicyclomine (BENTYL) 10 MG capsule, Take 1 tab at bedtime.or as directed, Disp: 30 capsule, Rfl: 0 .  fluticasone (FLOVENT HFA) 44 MCG/ACT inhaler, Inhale 2 puffs into the lungs 2 (two) times daily., Disp: 1 Inhaler, Rfl: 12 .  glimepiride (AMARYL) 2 MG tablet, Take 1 tablet (2 mg total) by mouth daily with breakfast., Disp: 90 tablet, Rfl: 3 .  hydrochlorothiazide (HYDRODIURIL) 25 MG tablet, Take 1 tablet (25 mg total) by mouth daily., Disp: 90 tablet, Rfl: 3 .  losartan (COZAAR) 100 MG tablet, Take 1 tablet (100 mg total) by mouth daily., Disp: 30 tablet, Rfl: 1 .  montelukast (SINGULAIR) 10 MG tablet, Take 1 tablet (10 mg total) by mouth daily., Disp: 90 tablet, Rfl: 3 .  omeprazole-sodium bicarbonate (ZEGERID) 40-1100 MG per capsule, Take 1 capsule by mouth daily., Disp: 90 capsule, Rfl: 3 .  Probiotic Product (PROBIOTIC PO), Take by mouth daily., Disp: , Rfl:  .  SAVELLA TITRATION PACK 12.5 & 25 & 50 MG MISC, FOLLOW DIRECTIONS ON DOSE PACK, Disp: , Rfl: 0  EXAM:  Filed Vitals:   03/20/16 1300  BP: 154/108  Pulse: 107  Temp: 98.2 F (36.8 C)    Body mass index is 30.94 kg/(m^2).  GENERAL: vitals reviewed and listed above, alert, oriented, appears well hydrated and in no acute distress  HEENT: atraumatic, conjunttiva clear, no obvious abnormalities on inspection of external nose and ears  NECK: no obvious masses on inspection  LUNGS: clear to auscultation bilaterally, no wheezes, rales or rhonchi, good air movement  CV: HRRR, no peripheral edema  MS: moves all extremities without noticeable abnormality  PSYCH: pleasant and cooperative, no obvious depression or anxiety  ASSESSMENT  AND PLAN:  Discussed the following assessment and plan:  Essential hypertension  Type 2 diabetes mellitus without complication, without long-term current use of insulin (HCC)  Stress  -Increase losartan, may try to combine losartan with the diuretic in the future and add Norvasc if needed -advised follow up/CPE in 1 month to recheck BP and labs -labs and lifestyle recs -foot exam, deferred given situation today with marital stress and she refused as she reported she sees Dr. Milinda Pointer for this -advised eye exam, she reports this was done, my assistant was  advised to obtain a copy of her last eye exam and update this in the records -Advised assistant to obtain lab results from outside office as patient refused labs today, reported [facility] -Patient advised to return or notify a doctor immediately if symptoms worsen or persist or new concerns arise.  Patient Instructions  BEFORE YOU LEAVE: -obtain lab report from Armida Sans for hgba1c -schedule physical exam and follow up in 1 month; please com fasting and will plan to check labs and recheck blood pressure  Increase the losartan to 140m; call with blood pressure readings in 2 weeks.  We recommend the following healthy lifestyle measures: - eat a healthy whole foods diet consisting of regular small meals composed of vegetables, fruits, beans, nuts, seeds, healthy meats such as white chicken and fish and whole grains.  - avoid sweets, white starchy foods, fried foods, fast food, processed foods, sodas, red meet and other fattening foods.  - get a least 150-300 minutes of aerobic exercise per week.             KColin BentonR.

## 2016-03-31 ENCOUNTER — Other Ambulatory Visit: Payer: Self-pay | Admitting: Family Medicine

## 2016-04-01 ENCOUNTER — Encounter: Payer: Self-pay | Admitting: Podiatry

## 2016-04-01 ENCOUNTER — Ambulatory Visit (INDEPENDENT_AMBULATORY_CARE_PROVIDER_SITE_OTHER): Payer: BLUE CROSS/BLUE SHIELD | Admitting: Podiatry

## 2016-04-01 DIAGNOSIS — E1142 Type 2 diabetes mellitus with diabetic polyneuropathy: Secondary | ICD-10-CM | POA: Diagnosis not present

## 2016-04-01 NOTE — Progress Notes (Signed)
She presents today for six-month follow-up of diabetic foot. She denies any changes in the feet. Denies fever chills nausea vomiting muscle aches and pains states that she does get some tingling in her toes occasionally.  Objective: Vital signs are stable she is alert and oriented 3. Pulses are strongly palpable. Neurologic sensorium is intact per Semmes-Weinstein monofilament. Deep tendon reflexes are intact bilateral muscle strength is 5 over 5 dorsiflexion plantar flexors and inverters and everters all his musculature is intact. Orthopedic evaluation and x-rays all joints distal to the ankle for range of motion without crepitation. All surgical sites have gone on to heal uneventfully and the left foot including endoscopic plantar fasciotomy in the peroneal tendon repair of the left foot. All of this is gone on to heal uneventfully and appears to be doing very well with exception of some neuritis associated with a cervical nerve left.  Assessment: Well-healing surgical foot. Diabetes mellitus with mild diabetic peripheral neuropathy. Digital performance are noted.  Plan: Follow up with us in 6 months.

## 2016-04-18 ENCOUNTER — Ambulatory Visit (INDEPENDENT_AMBULATORY_CARE_PROVIDER_SITE_OTHER): Payer: BLUE CROSS/BLUE SHIELD | Admitting: Family Medicine

## 2016-04-18 ENCOUNTER — Encounter: Payer: Self-pay | Admitting: Family Medicine

## 2016-04-18 VITALS — BP 150/100 | HR 98 | Temp 97.7°F | Ht 63.0 in | Wt 175.1 lb

## 2016-04-18 DIAGNOSIS — F411 Generalized anxiety disorder: Secondary | ICD-10-CM

## 2016-04-18 DIAGNOSIS — E119 Type 2 diabetes mellitus without complications: Secondary | ICD-10-CM | POA: Diagnosis not present

## 2016-04-18 DIAGNOSIS — I10 Essential (primary) hypertension: Secondary | ICD-10-CM | POA: Diagnosis not present

## 2016-04-18 DIAGNOSIS — E785 Hyperlipidemia, unspecified: Secondary | ICD-10-CM | POA: Diagnosis not present

## 2016-04-18 LAB — BASIC METABOLIC PANEL
BUN: 18 mg/dL (ref 6–23)
CHLORIDE: 100 meq/L (ref 96–112)
CO2: 28 meq/L (ref 19–32)
CREATININE: 0.86 mg/dL (ref 0.40–1.20)
Calcium: 9.9 mg/dL (ref 8.4–10.5)
GFR: 74.41 mL/min (ref >60.00)
Glucose, Bld: 198 mg/dL — ABNORMAL HIGH (ref 70–99)
POTASSIUM: 3.1 meq/L — AB (ref 3.5–5.1)
SODIUM: 138 meq/L (ref 135–145)

## 2016-04-18 LAB — CHOLESTEROL, TOTAL: CHOLESTEROL: 213 mg/dL — AB (ref 0–200)

## 2016-04-18 LAB — HEMOGLOBIN A1C: HEMOGLOBIN A1C: 6.6 % — AB (ref 4.6–6.5)

## 2016-04-18 LAB — HDL CHOLESTEROL: HDL: 39.9 mg/dL (ref >39.00)

## 2016-04-18 MED ORDER — AMLODIPINE BESYLATE 5 MG PO TABS: 5.0000 mg | ORAL_TABLET | Freq: Every day | ORAL | Status: DC

## 2016-04-18 MED ORDER — LOSARTAN POTASSIUM-HCTZ 100-25 MG PO TABS: 1.0000 | ORAL_TABLET | Freq: Every day | ORAL | Status: DC

## 2016-04-18 NOTE — Progress Notes (Signed)
Pre visit review using our clinic review tool, if applicable. No additional management support is needed unless otherwise documented below in the visit note. 

## 2016-04-18 NOTE — Progress Notes (Signed)
HPI:   [name] is a 50 year old with a past medical history of diabetes, hypertension, asthma, hyperlipidemia and poor compliance here for follow-up regarding her blood pressure. It was elevated at the last visit, she refused labs or change in treatment at that time as she was stressed about marital issues. Today she reports: She is doing somewhat better in terms marital issues, though still upset. Has been recently moved out. She is getting counseling is trying to focus on the positive things. Does think she will have more time for herself to do diet and exercise which she has recently started. Her blood pressure continues to run high at home. Denies: Chest pain, shortness breath, palpitations, swelling. She is not fasting today but would like to do lab work.  ROS: See pertinent positives and negatives per HPI.  Past Medical History  Diagnosis Date  . ALLERGIC RHINITIS 10/08/2007  . ASTHMA 04/22/2007  . GERD 04/22/2007  . Headache(784.0) 10/08/2007  . HYPERTENSION 04/22/2007  . HYPERLIPIDEMIA 04/22/2007  . LOW BACK PAIN 06/27/2008  . LUQ PAIN 09/06/2010  . NAUSEA 09/06/2010  . IBS (irritable bowel syndrome)     constipation predominant  . PONV (postoperative nausea and vomiting)   . Diabetes mellitus without complication ([place])   . Arthritis     Past Surgical History  Procedure Laterality Date  . Knee surgery  92, 96, 01, 08    right  . Knee surgery  88    left  . Nasal polyp surgery  95    sinus  . Lumbar fusion  05    L4-5 S1  . Tubal ligation    . Cesarean section    . Knee arthroplasty Right 04/29/2013    Procedure: COMPUTER ASSISTED TOTAL KNEE ARTHROPLASTY;  Surgeon: Marybelle Killings, MD;  Location: [facility];  Service: Orthopedics;  Laterality: Right;  Right Total Knee Arthroplasty, Cemented    Family History  Problem Relation Age of Onset  . Hypertension Mother   . Arthritis Mother     OA s/p bilateral hip replacement  . Diabetes Father   . Heart disease Father     CAD   . Hyperlipidemia Father   . Hypertension Father   . Pulmonary fibrosis Father   . Osteopenia Sister     older with GI problems s/p colectomy  . Cancer Maternal Grandmother     Ovarian Cancer  . Osteopenia Sister     Social History   Social History  . Marital Status: Married    Spouse Name: N/A  . Number of Children: 2  . Years of Education: 16   Occupational History  . substitute teacher [employer]   Social History Main Topics  . Smoking status: Never Smoker   . Smokeless tobacco: Never Used  . Alcohol Use: No  . Drug Use: No  . Sexual Activity:    Partners: Male   Other Topics Concern  . None   Social History Narrative   Updated 08/09/2015   Work or School: Now retired as of 07/2015; prior work: Hydrologist, part Theatre manager      Home Situation: son with special needs, sick a lot also 43 yo daughter      Spiritual Beliefs: christian      Lifestyle: no regular exercise, diet in so so           Current outpatient prescriptions:  .  Acetaminophen (TYLENOL ARTHRITIS PAIN PO), Take 2 tablets by mouth 2 (two) times daily., Disp: ,  Rfl:  .  albuterol (PROVENTIL HFA;VENTOLIN HFA) 108 (90 BASE) MCG/ACT inhaler, Inhale 2 puffs into the lungs 4 (four) times daily as needed for wheezing or shortness of breath., Disp: 18 g, Rfl: 11 .  atorvastatin (LIPITOR) 40 MG tablet, TAKE 1 TABLET BY MOUTH DAILY, Disp: 30 tablet, Rfl: 4 .  baclofen (LIORESAL) 10 MG tablet, TK 1 T PO TID, Disp: , Rfl: 0 .  blood glucose meter kit and supplies KIT, Use to test blood sugar once a day, Disp: 1 each, Rfl: 0 .  butalbital-acetaminophen-caffeine (FIORICET, ESGIC) 50-325-40 MG tablet, Take one tablet not more then every 12 hours only for migraine.Do not use on a frequent basis., Disp: 14 tablet, Rfl: 1 .  cetirizine-pseudoephedrine (ZYRTEC-D) 5-120 MG tablet, Take 1 tablet by mouth., Disp: , Rfl:  .  dicyclomine (BENTYL) 10 MG capsule, Take 1 tab at bedtime.or as directed, Disp: 30  capsule, Rfl: 0 .  fluticasone (FLOVENT HFA) 44 MCG/ACT inhaler, Inhale 2 puffs into the lungs 2 (two) times daily., Disp: 1 Inhaler, Rfl: 12 .  glimepiride (AMARYL) 2 MG tablet, TAKE 1 TABLET BY MOUTH DAILY WITH BREAKFAST, Disp: 30 tablet, Rfl: 5 .  montelukast (SINGULAIR) 10 MG tablet, TAKE 1 TABLET (10 MG TOTAL) BY MOUTH DAILY., Disp: 90 tablet, Rfl: 1 .  omeprazole-sodium bicarbonate (ZEGERID) 40-1100 MG per capsule, Take 1 capsule by mouth daily., Disp: 90 capsule, Rfl: 3 .  Probiotic Product (PROBIOTIC PO), Take by mouth daily., Disp: , Rfl:  .  SAVELLA TITRATION PACK 12.5 & 25 & 50 MG MISC, FOLLOW DIRECTIONS ON DOSE PACK, Disp: , Rfl: 0 .  amLODipine (NORVASC) 5 MG tablet, Take 1 tablet (5 mg total) by mouth daily., Disp: 90 tablet, Rfl: 3 .  losartan-hydrochlorothiazide (HYZAAR) 100-25 MG tablet, Take 1 tablet by mouth daily., Disp: 90 tablet, Rfl: 3  EXAM:  Filed Vitals:   04/18/16 1347  BP: 150/100  Pulse: 98  Temp: 97.7 F (36.5 C)    Body mass index is 31.03 kg/(m^2).  GENERAL: vitals reviewed and listed above, alert, oriented, appears well hydrated and in no acute distress  HEENT: atraumatic, conjunttiva clear, no obvious abnormalities on inspection of external nose and ears  NECK: no obvious masses on inspection  LUNGS: clear to auscultation bilaterally, no wheezes, rales or rhonchi, good air movement  CV: HRRR, no peripheral edema  MS: moves all extremities without noticeable abnormality  PSYCH: pleasant and cooperative, no obvious depression or anxiety  ASSESSMENT AND PLAN:  Discussed the following assessment and plan:  Essential hypertension - Plan: Basic metabolic panel, Hemoglobin A1c  Hyperlipemia - Plan: Cholesterol, Total, HDL cholesterol  Type 2 diabetes mellitus without complication, without long-term current use of insulin (HCC)  Anxiety state  -Discussed options and risks for treatment of blood pressure and will start Norvasc, we'll combine  her losartan hydrochlorothiazide to make this regimen easier for her -Lifestyle recommendations -Labs today -Physical and follow-up -Patient advised to return or notify a doctor immediately if symptoms worsen or persist or new concerns arise.  Patient Instructions  BEFORE YOU LEAVE: -labs -follow up: it looks like you have not had a physical this year - schedule physical in 1-2 months if that is the case -weight at last visit   Please continue your current medications, PLUS add amlodipine (norvasc) 5 mg daily. You can use up your current blood pressure medications - and then start the combo of these medications - I sent this to your pharmacy.   We  recommend the following healthy lifestyle: 1) Small portions - eat off of salad plate instead of dinner plate 2) Eat a healthy clean diet with avoidance of (less then 1 serving per week) processed foods, sweetened drinks, white starches, red meat, fast foods and sweets and consisting of: * 5-9 servings per day of fresh or frozen fruits and vegetables (not corn or potatoes, not dried or canned) *nuts and seeds, beans *olives and olive oil *small portions of lean meats such as fish and white chicken  *small portions of whole grains 3)Get at least 150 minutes of sweaty aerobic exercise per week 4)reduce stress - counseling, meditation, relaxation to balance other aspects of your life  We have ordered labs or studies at this visit. It can take up to 1-2 weeks for results and processing. IF results require follow up or explanation, we will call you with instructions. Clinically stable results will be released to your [facility]. If you have not heard from [place] or cannot find your results in [facility] in 2 weeks please contact our office at [phone].  If you are not yet signed up for [facility], please consider signing up.            Colin Benton R., DO

## 2016-04-18 NOTE — Patient Instructions (Addendum)
BEFORE YOU LEAVE: -labs -follow up: it looks like you have not had a physical this year - schedule physical in 1-2 months if that is the case -weight at last visit   Please continue your current medications, PLUS add amlodipine (norvasc) 5 mg daily. You can use up your current blood pressure medications - and then start the combo of these medications - I sent this to your pharmacy.   We recommend the following healthy lifestyle: 1) Small portions - eat off of salad plate instead of dinner plate 2) Eat a healthy clean diet with avoidance of (less then 1 serving per week) processed foods, sweetened drinks, white starches, red meat, fast foods and sweets and consisting of: * 5-9 servings per day of fresh or frozen fruits and vegetables (not corn or potatoes, not dried or canned) *nuts and seeds, beans *olives and olive oil *small portions of lean meats such as fish and white chicken  *small portions of whole grains 3)Get at least 150 minutes of sweaty aerobic exercise per week 4)reduce stress - counseling, meditation, relaxation to balance other aspects of your life  We have ordered labs or studies at this visit. It can take up to 1-2 weeks for results and processing. IF results require follow up or explanation, we will call you with instructions. Clinically stable results will be released to [facility]. If you have not heard from us or cannot find your results in [facility] in 2 weeks please contact our office at [phone].  If you are not yet signed up for [facility], please consider signing up.

## 2016-05-19 ENCOUNTER — Encounter: Payer: Self-pay | Admitting: Internal Medicine

## 2016-05-20 ENCOUNTER — Ambulatory Visit (INDEPENDENT_AMBULATORY_CARE_PROVIDER_SITE_OTHER): Payer: BLUE CROSS/BLUE SHIELD | Admitting: Family Medicine

## 2016-05-20 ENCOUNTER — Encounter: Payer: Self-pay | Admitting: Family Medicine

## 2016-05-20 VITALS — BP 122/82 | HR 88 | Temp 97.8°F | Ht 62.75 in | Wt 173.6 lb

## 2016-05-20 DIAGNOSIS — E785 Hyperlipidemia, unspecified: Secondary | ICD-10-CM

## 2016-05-20 DIAGNOSIS — I10 Essential (primary) hypertension: Secondary | ICD-10-CM | POA: Diagnosis not present

## 2016-05-20 DIAGNOSIS — Z0000 Encounter for general adult medical examination without abnormal findings: Secondary | ICD-10-CM

## 2016-05-20 DIAGNOSIS — E119 Type 2 diabetes mellitus without complications: Secondary | ICD-10-CM | POA: Diagnosis not present

## 2016-05-20 NOTE — Progress Notes (Signed)
Pre visit review using our clinic review tool, if applicable. No additional management support is needed unless otherwise documented below in the visit note. 

## 2016-05-20 NOTE — Patient Instructions (Signed)
BEFORE YOU LEAVE: Ronnald Collum-Jo Anne, obtain diabetic eye exam report from [facility], Dr. Martha ClanHutto -follow up: about 4 months; morning appt and come fasting if possible  Continue current medications  We recommend the following healthy lifestyle: 1) Small portions - eat off of salad plate instead of dinner plate 2) Eat a healthy clean diet with avoidance of (less then 1 serving per week) processed foods, sweetened drinks, white starches, red meat, fast foods and sweets and consisting of: * 5-9 servings per day of fresh or frozen fruits and vegetables (not corn or potatoes, not dried or canned) *nuts and seeds, beans *olives and olive oil *small portions of lean meats such as fish and white chicken  *small portions of whole grains 3)Get at least 150 minutes of sweaty aerobic exercise per week 4)reduce stress - counseling, meditation, relaxation to balance other aspects of your life

## 2016-05-20 NOTE — Progress Notes (Signed)
HPI:  [name] is a 50 year old with a past medical history of diabetes, hypertension, asthma, hyperlipidemia, recent marital stress and poor compliance here for her CPE. We added norvasc at her last visit. acei stopped in to past due to resp issues. She had labs last month. Hgba1c 6.6, chol ratio a little up. She has opted to cont current medications and work on lifestyle. Trying to increase exercise and considering Mediterranean diet.  -Concerns and/or follow up today: none  -Diabetes and Dyslipidemia Screening: done, reviewed today  -Vaccines: UTD - she plans to get flu shot at [facility]  -pap history: did with [facility] last year; agrees to schedule next year  -wants STI testing (Hep C if born [birth year]): no  -FH breast, colon or ovarian ca: see FH Last mammogram: does breast health with [facility] Last colon cancer screening: n/a  Breast Ca Risk Assessment:  -sees gyn  -Alcohol, Tobacco, drug use: see social history  Review of Systems - no fevers, unintentional weight loss, vision loss, hearing loss, chest pain, sob, hemoptysis, melena, hematochezia, hematuria, genital discharge, changing or concerning skin lesions, bleeding, bruising, loc, thoughts of self harm or SI  Past Medical History:  Diagnosis Date  . ALLERGIC RHINITIS 10/08/2007  . Arthritis   . ASTHMA 04/22/2007  . Diabetes mellitus without complication ([place])   . GERD 04/22/2007  . Headache(784.0) 10/08/2007  . HYPERLIPIDEMIA 04/22/2007  . HYPERTENSION 04/22/2007  . IBS (irritable bowel syndrome)    constipation predominant  . LOW BACK PAIN 06/27/2008  . LUQ PAIN 09/06/2010  . NAUSEA 09/06/2010  . PONV (postoperative nausea and vomiting)     Past Surgical History:  Procedure Laterality Date  . CESAREAN SECTION    . KNEE ARTHROPLASTY Right 04/29/2013   Procedure: COMPUTER ASSISTED TOTAL KNEE ARTHROPLASTY;  Surgeon: Marybelle Killings, MD;  Location: [place];  Service: Orthopedics;  Laterality: Right;  Right Total  Knee Arthroplasty, Cemented  . KNEE SURGERY  92, 96, 01, 08   right  . KNEE SURGERY  88   left  . LUMBAR FUSION  05   L4-5 S1  . NASAL POLYP SURGERY  95   sinus  . TUBAL LIGATION      Family History  Problem Relation Age of Onset  . Hypertension Mother   . Arthritis Mother     OA s/p bilateral hip replacement  . Diabetes Father   . Heart disease Father     CAD  . Hyperlipidemia Father   . Hypertension Father   . Pulmonary fibrosis Father   . Osteopenia Sister     older with GI problems s/p colectomy  . Cancer Maternal Grandmother     Ovarian Cancer  . Osteopenia Sister     Social History   Social History  . Marital status: Married    Spouse name: N/A  . Number of children: 2  . Years of education: 16   Occupational History  . substitute teacher [employer]   Social History Main Topics  . Smoking status: Never Smoker  . Smokeless tobacco: Never Used  . Alcohol use No  . Drug use: No  . Sexual activity: Yes    Partners: Male   Other Topics Concern  . None   Social History Narrative   Updated 08/09/2015   Work or School: Now retired as of 07/2015; prior work: Hydrologist, part Theatre manager      Home Situation: son with special needs, sick a lot also 77 yo  daughter      Spiritual Beliefs: christian      Lifestyle: no regular exercise, diet in so so           Current Outpatient Prescriptions:  .  Acetaminophen (TYLENOL ARTHRITIS PAIN PO), Take 2 tablets by mouth 2 (two) times daily., Disp: , Rfl:  .  albuterol (PROVENTIL HFA;VENTOLIN HFA) 108 (90 BASE) MCG/ACT inhaler, Inhale 2 puffs into the lungs 4 (four) times daily as needed for wheezing or shortness of breath., Disp: 18 g, Rfl: 11 .  amLODipine (NORVASC) 5 MG tablet, Take 1 tablet (5 mg total) by mouth daily., Disp: 90 tablet, Rfl: 3 .  atorvastatin (LIPITOR) 40 MG tablet, TAKE 1 TABLET BY MOUTH DAILY, Disp: 30 tablet, Rfl: 4 .  baclofen (LIORESAL) 10 MG tablet, TK 1 T PO TID, Disp: , Rfl:  0 .  blood glucose meter kit and supplies KIT, Use to test blood sugar once a day, Disp: 1 each, Rfl: 0 .  butalbital-acetaminophen-caffeine (FIORICET, ESGIC) 50-325-40 MG tablet, Take one tablet not more then every 12 hours only for migraine.Do not use on a frequent basis., Disp: 14 tablet, Rfl: 1 .  cetirizine-pseudoephedrine (ZYRTEC-D) 5-120 MG tablet, Take 1 tablet by mouth., Disp: , Rfl:  .  dicyclomine (BENTYL) 10 MG capsule, Take 1 tab at bedtime.or as directed, Disp: 30 capsule, Rfl: 0 .  fluticasone (FLOVENT HFA) 44 MCG/ACT inhaler, Inhale 2 puffs into the lungs 2 (two) times daily., Disp: 1 Inhaler, Rfl: 12 .  glimepiride (AMARYL) 2 MG tablet, TAKE 1 TABLET BY MOUTH DAILY WITH BREAKFAST, Disp: 30 tablet, Rfl: 5 .  losartan-hydrochlorothiazide (HYZAAR) 100-25 MG tablet, Take 1 tablet by mouth daily., Disp: 90 tablet, Rfl: 3 .  montelukast (SINGULAIR) 10 MG tablet, TAKE 1 TABLET (10 MG TOTAL) BY MOUTH DAILY., Disp: 90 tablet, Rfl: 1 .  omeprazole-sodium bicarbonate (ZEGERID) 40-1100 MG per capsule, Take 1 capsule by mouth daily., Disp: 90 capsule, Rfl: 3 .  Probiotic Product (PROBIOTIC PO), Take by mouth daily., Disp: , Rfl:  .  SAVELLA TITRATION PACK 12.5 & 25 & 50 MG MISC, FOLLOW DIRECTIONS ON DOSE PACK, Disp: , Rfl: 0  EXAM:  Vitals:   05/20/16 1327  BP: 122/82  Pulse: 88  Temp: 97.8 F (36.6 C)    GENERAL: vitals reviewed and listed below, alert, oriented, appears well hydrated and in no acute distress  HEENT: head atraumatic, PERRLA, normal appearance of eyes, ears, nose and mouth. moist mucus membranes.  NECK: supple, no masses or lymphadenopathy  LUNGS: clear to auscultation bilaterally, no rales, rhonchi or wheeze  CV: HRRR, no peripheral edema or cyanosis, normal pedal pulses  BREAST: does with gyn  ABDOMEN: bowel sounds normal, soft, non tender to palpation, no masses, no rebound or guarding  GU: declined, does with gyn  SKIN: no rash or abnormal lesions, few  cherry angiomas  MS: normal gait, moves all extremities normally  NEURO: normal gait, speech and thought processing grossly intact, muscle tone grossly intact throughout  PSYCH: normal affect, pleasant and cooperative  ASSESSMENT AND PLAN:  Discussed the following assessment and plan:  There are no diagnoses linked to this encounter.Encounter for preventive health examination  Essential hypertension  Hyperlipemia  Type 2 diabetes mellitus without complication, without long-term current use of insulin ([place])   -Discussed and advised all [place] preventive services health task force level A and B recommendations for age, sex and risks.  -Advised at least 150 minutes of exercise per week and a  healthy diet with avoidance of (less then 1 serving per week) processed foods, white starches, red meat, fast foods and sweets and consisting of: * 5-9 servings of fresh fruits and vegetables (not corn or potatoes) *nuts and seeds, beans *olives and olive oil *lean meats such as fish and white chicken  *whole grains  -labs, studies and vaccines per orders this encounter  No orders of the defined types were placed in this encounter.   Patient advised to return to clinic immediately if symptoms worsen or persist or new concerns.  Patient Instructions  BEFORE YOU LEAVE: -Wendie Simmer, obtain diabetic eye exam report from [place], Dr. Laban Emperor -follow up: about 4 months; morning appt and come fasting if possible  Continue current medications  We recommend the following healthy lifestyle: 1) Small portions - eat off of salad plate instead of dinner plate 2) Eat a healthy clean diet with avoidance of (less then 1 serving per week) processed foods, sweetened drinks, white starches, red meat, fast foods and sweets and consisting of: * 5-9 servings per day of fresh or frozen fruits and vegetables (not corn or potatoes, not dried or canned) *nuts and seeds, beans *olives and olive oil *small portions  of lean meats such as fish and white chicken  *small portions of whole grains 3)Get at least 150 minutes of sweaty aerobic exercise per week 4)reduce stress - counseling, meditation, relaxation to balance other aspects of your life     No Follow-up on file.  Colin Benton R., DO

## 2016-06-10 IMAGING — CT CT ABD-PELV W/ CM
3 of 5 series · 13 of 36 positions shown, 19 images · IV contrast (READICAT/WATER & [ID] ISOVUE 300)
Comparison: CT scan dated 08/10/2014

CLINICAL DATA: Left flank pain and lower pelvic pain.

EXAM:
CT ABDOMEN AND PELVIS WITH CONTRAST
TECHNIQUE: Multidetector CT imaging of the abdomen and pelvis was performed
using the standard protocol following bolus administration of
intravenous contrast.
CONTRAST:  100 cc Qsovue-ENN

[Series 3: abd/pelvis with · axial · 0.81mm/px · z∈[-353,-8]mm · 7 of 93 slices shown, 12 images]
[im 12/93  soft-tissue]
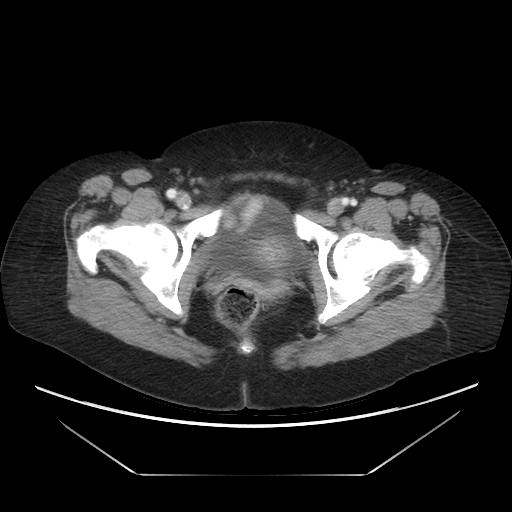
[im 12/93  bone]
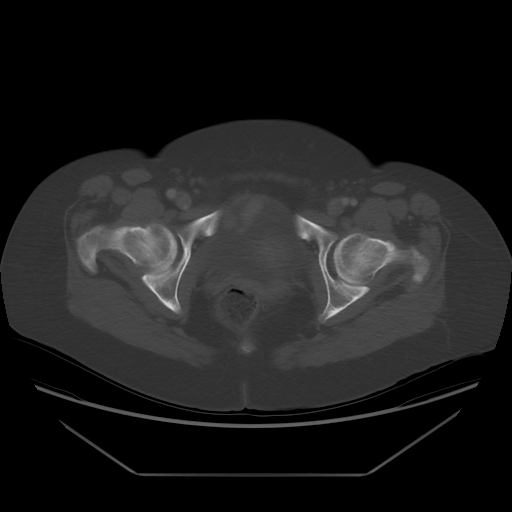
[im 24/93  soft-tissue]
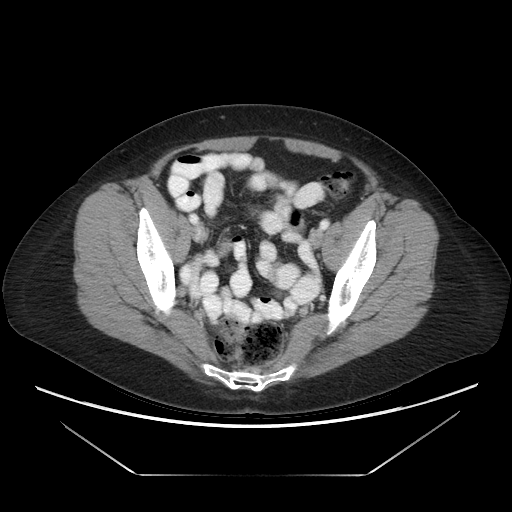
[im 35/93  soft-tissue]
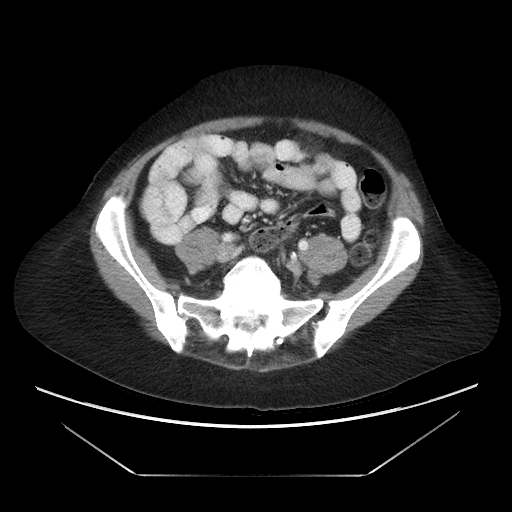
[im 47/93  soft-tissue]
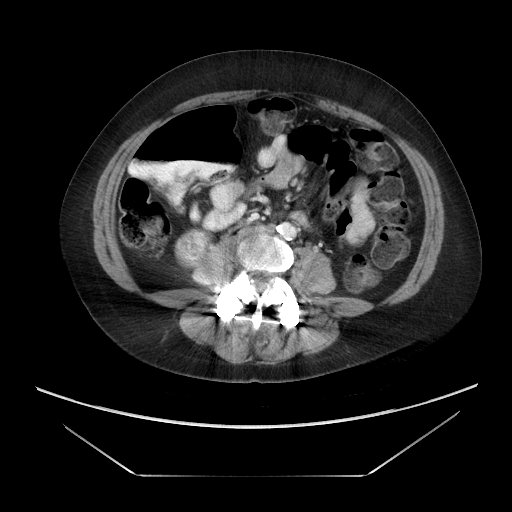
[im 47/93  lung]
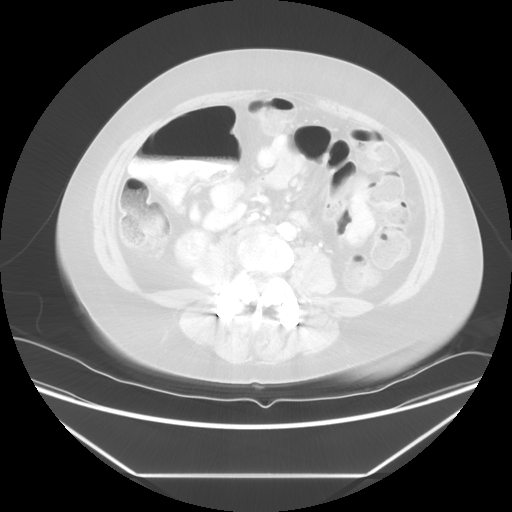
[im 58/93  soft-tissue]
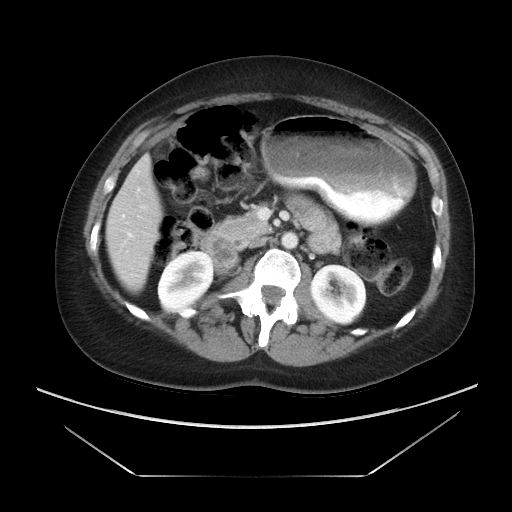
[im 58/93  lung]
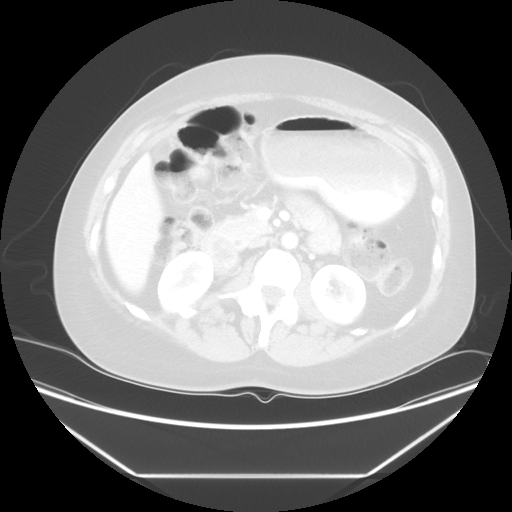
[im 70/93  soft-tissue]
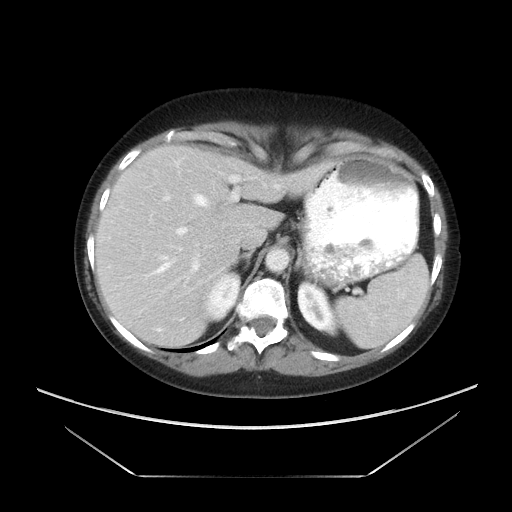
[im 70/93  lung]
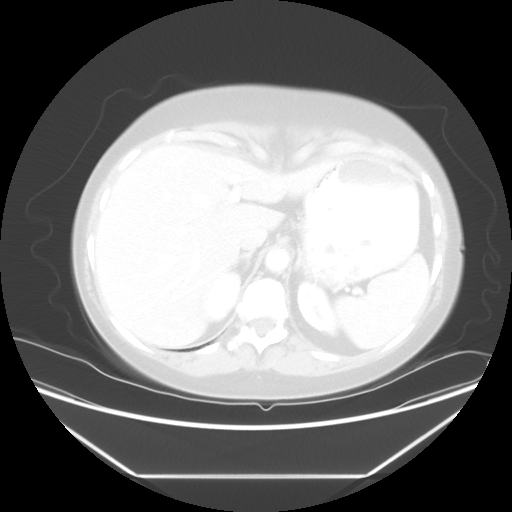
[im 81/93  soft-tissue]
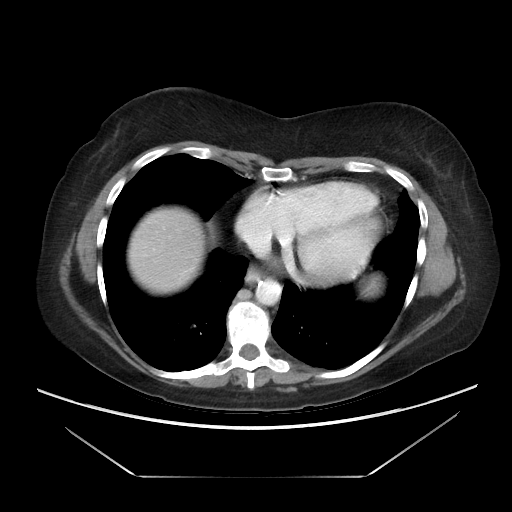
[im 81/93  lung]
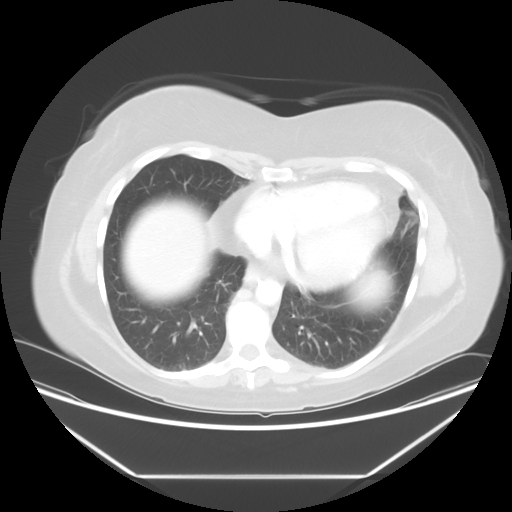

[Series 601: coronal body · coronal · 0.93mm/px · 1 of 122 slices shown, 2 images]
[im 41/122  soft-tissue]
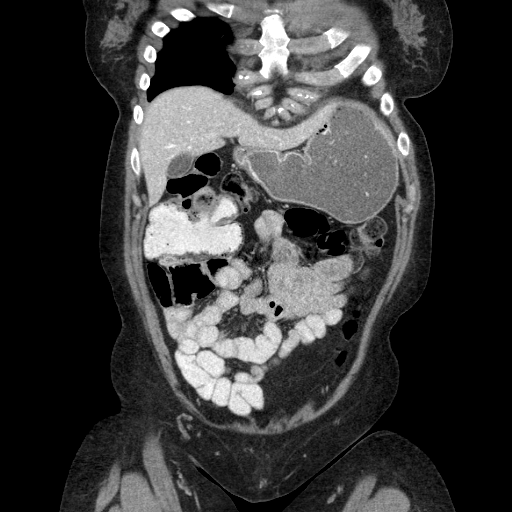
[im 41/122  bone]
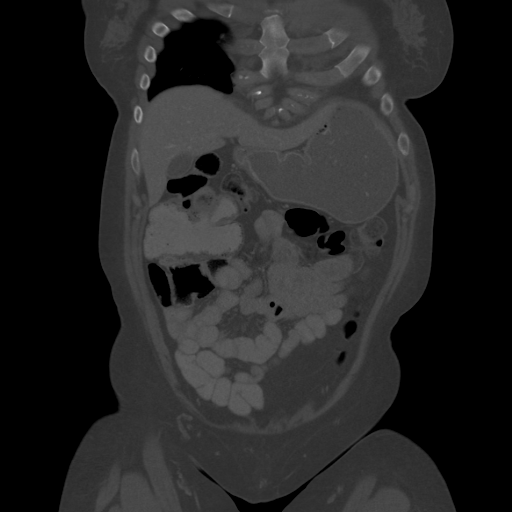

[Series 602: sagittal body · sagittal · 0.93mm/px · 5 of 168 slices shown]
[im 11/168  soft-tissue]
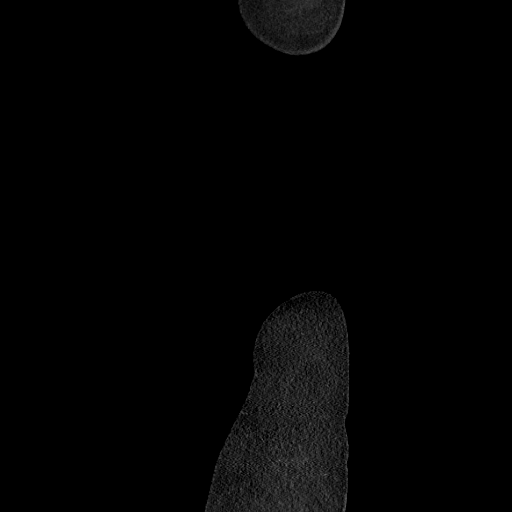
[im 32/168  soft-tissue]
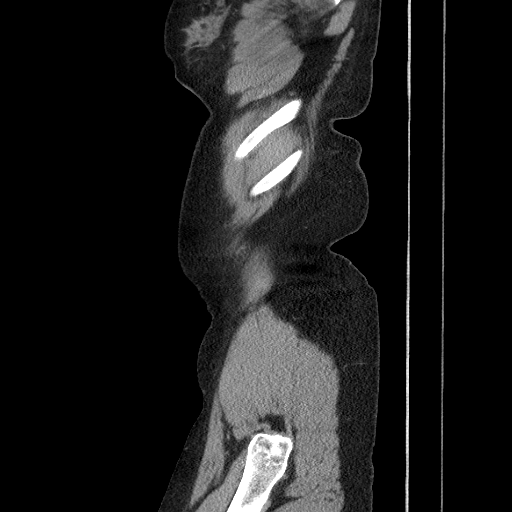
[im 53/168  soft-tissue]
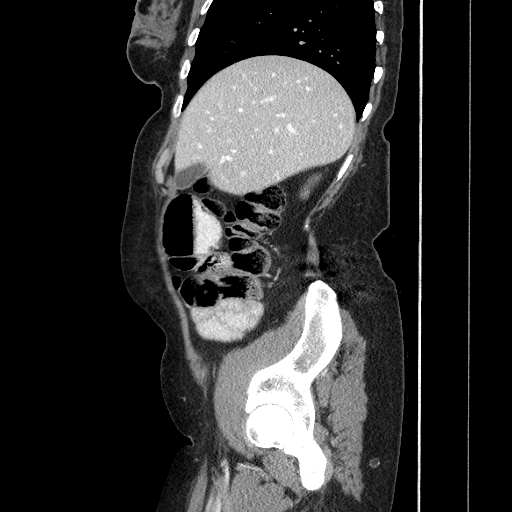
[im 74/168  soft-tissue]
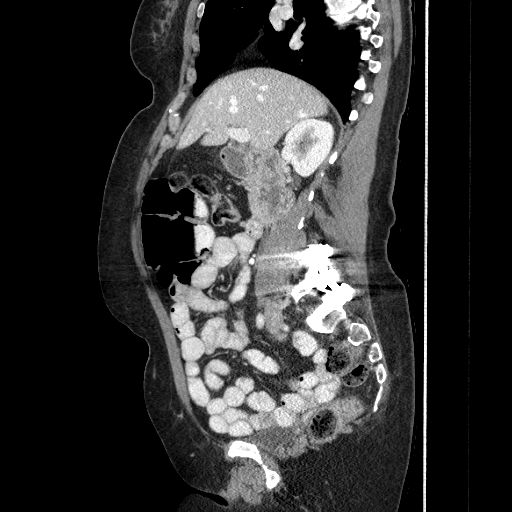
[im 94/168  soft-tissue]
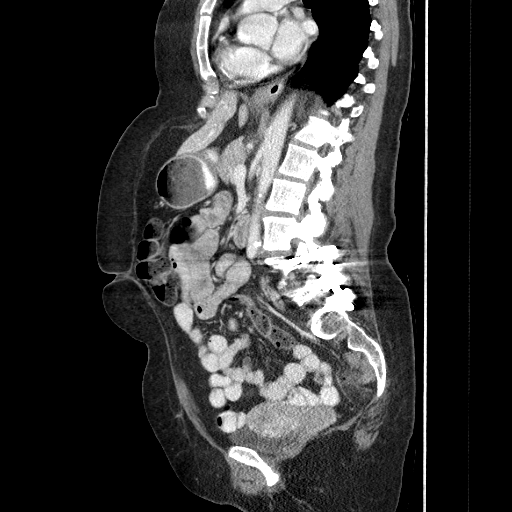

[13 of 36 positions shown; findings below may reference images not displayed]

FINDINGS: The liver, biliary tree, spleen, pancreas, adrenal glands, and
kidneys are normal. There scattered calcifications in the abdominal
aorta and iliac arteries.

The bowel appears normal except for slight distention of the cecum.
However, there is no obstruction. Appendix is normal. The patient
does have a mobile cecum. The sigmoid portion of the colon is quite
redundant.

Uterus and ovaries are normal. Bladder is normal. No acute osseous
abnormality. Thoracolumbar scoliosis. Previous lumbar fusion at L4-5
and L5-S1.
IMPRESSION: Gaseous distention of the cecum without evidence of obstruction.
Otherwise benign appearing abdomen and pelvis.

## 2016-07-20 ENCOUNTER — Other Ambulatory Visit: Payer: Self-pay | Admitting: Family Medicine

## 2016-09-14 DIAGNOSIS — K589 Irritable bowel syndrome without diarrhea: Secondary | ICD-10-CM | POA: Insufficient documentation

## 2016-09-14 DIAGNOSIS — E669 Obesity, unspecified: Secondary | ICD-10-CM | POA: Insufficient documentation

## 2016-09-14 DIAGNOSIS — M797 Fibromyalgia: Secondary | ICD-10-CM | POA: Insufficient documentation

## 2016-09-14 NOTE — Progress Notes (Signed)
HPI: Here for follow up. Due for flu vaccine, eye exam, diabetes, BP labs and fasting lipids Struggling with some depression and anxiety, going thru separation, working and a mom, feeling overwhelmed and irritable most days, no SI or thoughts of self harm. No manic symptoms. Cymbalta upset her stomach in the past. PMH DM w/ polyneuropathy, HTN, HLD, Fibromyalgia, IBS, Obesity, Asthma and poor compliance. BP: losartan-hctz 100-25, norvasc 5 Diabetes: glimepiride 17m, seeing podiatrist, working on diet and exercise HLD/Obesity: lipitor 40, working on diet and exercise Asthma: singulair, flovent, proventil Fibromyalgia meds: on Savella, baclofen IBS: sees Dr. GCarlean PurlDid not take any of her medications today. No CP, SOB, HA.   ROS: See pertinent positives and negatives per HPI.  Past Medical History:  Diagnosis Date  . ALLERGIC RHINITIS 10/08/2007  . Arthritis   . ASTHMA 04/22/2007  . [place]   . GERD 04/22/2007  . Headache(784.0) 10/08/2007  . HYPERLIPIDEMIA 04/22/2007  . HYPERTENSION 04/22/2007  . IBS (irritable bowel syndrome)    constipation predominant  . LOW BACK PAIN 06/27/2008  . LUQ PAIN 09/06/2010  . NAUSEA 09/06/2010  . PONV (postoperative nausea and vomiting)     Past Surgical History:  Procedure Laterality Date  . CESAREAN SECTION    . KNEE ARTHROPLASTY Right 04/29/2013   Procedure: COMPUTER ASSISTED TOTAL KNEE ARTHROPLASTY;  Surgeon: MMarybelle Killings MD;  [place]  Service: Orthopedics;  Laterality: Right;  Right Total Knee Arthroplasty, Cemented  . KNEE SURGERY  92, 96, 01, 08   right  . KNEE SURGERY  88   left  . LUMBAR FUSION  05   L4-5 S1  . NASAL POLYP SURGERY  95   sinus  . TUBAL LIGATION      Family History  Problem Relation Age of Onset  . Hypertension Mother   . Arthritis Mother     OA s/p bilateral hip replacement  . Diabetes Father   . Heart disease Father     CAD  . Hyperlipidemia Father   . Hypertension  Father   . Pulmonary fibrosis Father   . Osteopenia Sister     older with GI problems s/p colectomy  . Cancer Maternal Grandmother     Ovarian Cancer  . Osteopenia Sister     Social History   Social History  . Marital status: Married    Spouse name: N/A  . Number of children: 2  . Years of education: 16   Occupational History  . substitute teacher [employer]  Social History Main Topics  . Smoking status: Never Smoker  . Smokeless tobacco: Never Used  . Alcohol use No  . Drug use: No  . Sexual activity: Yes    Partners: Male   Other Topics Concern  . None   Social History Narrative   Updated 08/09/2015   Work or School: Now retired as of 07/2015; prior work: UHydrologist part tTheatre manager     Home Situation: son with special needs, sick a lot also 737yo daughter      Spiritual Beliefs: christian      Lifestyle: no regular exercise, diet in so so           Current Outpatient Prescriptions:  .  Acetaminophen (TYLENOL ARTHRITIS PAIN PO), Take 2 tablets by mouth 2 (two) times daily., Disp: , Rfl:  .  albuterol (PROVENTIL HFA;VENTOLIN HFA) 108 (90 BASE) MCG/ACT inhaler, Inhale 2 puffs into the lungs 4 (four) times daily  as needed for wheezing or shortness of breath., Disp: 18 g, Rfl: 11 .  amLODipine (NORVASC) 5 MG tablet, Take 1 tablet (5 mg total) by mouth daily., Disp: 90 tablet, Rfl: 3 .  atorvastatin (LIPITOR) 40 MG tablet, TAKE 1 TABLET BY MOUTH DAILY, Disp: 30 tablet, Rfl: 5 .  baclofen (LIORESAL) 10 MG tablet, TK 1 T PO TID, Disp: , Rfl: 0 .  blood glucose meter kit and supplies KIT, Use to test blood sugar once a day, Disp: 1 each, Rfl: 0 .  butalbital-acetaminophen-caffeine (FIORICET, ESGIC) 50-325-40 MG tablet, Take one tablet not more then every 12 hours only for migraine.Do not use on a frequent basis., Disp: 14 tablet, Rfl: 1 .  cetirizine-pseudoephedrine (ZYRTEC-D) 5-120 MG tablet, Take 1 tablet by mouth., Disp: , Rfl:  .  dicyclomine (BENTYL) 10  MG capsule, Take 1 tab at bedtime.or as directed, Disp: 30 capsule, Rfl: 0 .  glimepiride (AMARYL) 2 MG tablet, TAKE 1 TABLET BY MOUTH DAILY WITH BREAKFAST, Disp: 30 tablet, Rfl: 5 .  losartan-hydrochlorothiazide (HYZAAR) 100-25 MG tablet, Take 1 tablet by mouth daily., Disp: 90 tablet, Rfl: 3 .  montelukast (SINGULAIR) 10 MG tablet, TAKE 1 TABLET (10 MG TOTAL) BY MOUTH DAILY., Disp: 90 tablet, Rfl: 1 .  omeprazole-sodium bicarbonate (ZEGERID) 40-1100 MG per capsule, Take 1 capsule by mouth daily., Disp: 90 capsule, Rfl: 3 .  Probiotic Product (PROBIOTIC PO), Take by mouth daily., Disp: , Rfl:  .  SAVELLA TITRATION PACK 12.5 & 25 & 50 MG MISC, FOLLOW DIRECTIONS ON DOSE PACK, Disp: , Rfl: 0 .  escitalopram (LEXAPRO) 5 MG tablet, Take 1 tablet (5 mg total) by mouth daily., Disp: 30 tablet, Rfl: 2  EXAM:  Vitals:   09/15/16 0805  BP: 136/88  Pulse: 90  Temp: 97.8 F (36.6 C)    Body mass index is 30.05 kg/m.  GENERAL: vitals reviewed and listed above, alert, oriented, appears well hydrated and in no acute distress  HEENT: atraumatic, conjunttiva clear, no obvious abnormalities on inspection of external nose and ears  NECK: no obvious masses on inspection  LUNGS: clear to auscultation bilaterally, no wheezes, rales or rhonchi, good air movement  CV: HRRR, no peripheral edema  MS: moves all extremities without noticeable abnormality  PSYCH: pleasant and cooperative, no obvious depression or anxiety  ASSESSMENT AND PLAN:  Discussed the following assessment and plan:  Hyperlipidemia, unspecified hyperlipidemia type - Plan: Lipid Panel  Essential hypertension - Plan: Basic metabolic panel  Type 2 diabetes mellitus with diabetic polyneuropathy, without long-term current use of insulin ([facility]) - Plan: Hemoglobin A1c  Moderate episode of recurrent major depressive disorder ([facility])  -discussed various tx options and risks for depression - she opted to try low dose lexapro, Also  advised to continue CBT, return and emergency precautions -lifestyle recommendations -labs -follow up 6 weeks -Patient advised to return or notify a doctor immediately if symptoms worsen or persist or new concerns arise.  Patient Instructions  BEFORE YOU LEAVE: -follow up: 4-6 weeks -labs  Please call today to schedule a diabetic eye exam with your eye doctor.  Start the Lexapro 5mg daily. Try to increase counseling and exercise if possible. Hang in there!  We have ordered labs or studies at this visit. It can take up to 1-2 weeks for results and processing. IF results require follow up or explanation, we will call you with instructions. Clinically stable results will be released to your MYCHART. If you have not heard from us or   cannot find your results in [facility] in 2 weeks please contact our office at [phone].    We recommend the following healthy lifestyle for LIFE: 1) Small portions.   Tip: eat off of a salad plate instead of a dinner plate.  Tip: if you need more or a snack choose fruits, veggies and/or a handful of nuts or seeds.  2) Eat a healthy clean diet.  * Tip: Avoid (less then 1 serving per week): processed foods, sweets, sweetened drinks, white starches (rice, flour, bread, potatoes, pasta, etc), red meat, fast foods, butter  *Tip: CHOOSE instead   * 5-9 servings per day of fresh or frozen fruits and vegetables (but not corn, potatoes, bananas, canned or dried fruit)   *nuts and seeds, beans   *olives and olive oil   *small portions of lean meats such as fish and white chicken    *small portions of whole grains  3)Get at least 150 minutes of sweaty aerobic exercise per week.  4)Reduce stress - consider counseling, meditation and relaxation to balance other aspects of your life.         Colin Benton R., DO

## 2016-09-15 ENCOUNTER — Encounter: Payer: Self-pay | Admitting: Family Medicine

## 2016-09-15 ENCOUNTER — Ambulatory Visit (INDEPENDENT_AMBULATORY_CARE_PROVIDER_SITE_OTHER): Payer: BLUE CROSS/BLUE SHIELD | Admitting: Family Medicine

## 2016-09-15 VITALS — BP 136/88 | HR 90 | Temp 97.8°F | Ht 62.75 in | Wt 168.3 lb

## 2016-09-15 DIAGNOSIS — E1142 Type 2 diabetes mellitus with diabetic polyneuropathy: Secondary | ICD-10-CM | POA: Diagnosis not present

## 2016-09-15 DIAGNOSIS — F331 Major depressive disorder, recurrent, moderate: Secondary | ICD-10-CM

## 2016-09-15 DIAGNOSIS — I10 Essential (primary) hypertension: Secondary | ICD-10-CM

## 2016-09-15 DIAGNOSIS — E785 Hyperlipidemia, unspecified: Secondary | ICD-10-CM | POA: Diagnosis not present

## 2016-09-15 LAB — BASIC METABOLIC PANEL
BUN: 19 mg/dL (ref 6–23)
CO2: 29 mEq/L (ref 19–32)
CREATININE: 0.81 mg/dL (ref 0.40–1.20)
Calcium: 9.7 mg/dL (ref 8.4–10.5)
Chloride: 104 mEq/L (ref 96–112)
GFR: 79.6 mL/min (ref >60.00)
Glucose, Bld: 150 mg/dL — ABNORMAL HIGH (ref 70–99)
Potassium: 3.6 mEq/L (ref 3.5–5.1)
Sodium: 141 mEq/L (ref 135–145)

## 2016-09-15 LAB — LIPID PANEL
Cholesterol: 173 mg/dL (ref 0–200)
HDL: 48 mg/dL (ref >39.00)
LDL CALC: 100 mg/dL — AB (ref 0–99)
NONHDL: 124.99
Total CHOL/HDL Ratio: 4
Triglycerides: 124 mg/dL (ref 0.0–149.0)
VLDL: 24.8 mg/dL (ref 0.0–40.0)

## 2016-09-15 LAB — HEMOGLOBIN A1C: Hgb A1c MFr Bld: 7.2 % — ABNORMAL HIGH (ref 4.6–6.5)

## 2016-09-15 MED ORDER — ESCITALOPRAM OXALATE 5 MG PO TABS: 5.0000 mg | ORAL_TABLET | Freq: Every day | ORAL | 2 refills | Status: DC

## 2016-09-15 NOTE — Progress Notes (Signed)
Pre visit review using our clinic review tool, if applicable. No additional management support is needed unless otherwise documented below in the visit note. 

## 2016-09-15 NOTE — Patient Instructions (Signed)
BEFORE YOU LEAVE: -follow up: 4-6 weeks -labs  Please call today to schedule a diabetic eye exam with your eye doctor.  Start the Lexapro 5mg  daily. Try to increase counseling and exercise if possible. Hang in there!  We have ordered labs or studies at this visit. It can take up to 1-2 weeks for results and processing. IF results require follow up or explanation, we will call you with instructions. Clinically stable results will be released to your [facility] CMYCHART. If you have not heard from us or cannot find your results [facility] in 2 weeks please contact our office at [phone].    We recommend the following healthy lifestyle for LIFE: 1) Small portions.   Tip: eat off of a salad plate instead of a dinner plate.  Tip: if you need more or a snack choose fruits, veggies and/or a handful of nuts or seeds.  2) Eat a healthy clean diet.  * Tip: Avoid (less then 1 serving per week): processed foods, sweets, sweetened drinks, white starches (rice, flour, bread, potatoes, pasta, etc), red meat, fast foods, butter  *Tip: CHOOSE instead   * 5-9 servings per day of fresh or frozen fruits and vegetables (but not corn, potatoes, bananas, canned or dried fruit)   *nuts and seeds, beans   *olives and olive oil   *small portions of lean meats such as fish and white chicken    *small portions of whole grains  3)Get at least 150 minutes of sweaty aerobic exercise per week.  4)Reduce stress - consider counseling, meditation and relaxation to balance other aspects of your life.

## 2016-09-18 MED ORDER — GLIMEPIRIDE 4 MG PO TABS: 4.0000 mg | ORAL_TABLET | Freq: Every day | ORAL | 3 refills | Status: DC

## 2016-09-18 NOTE — Addendum Note (Signed)
Addended by: Johnella MoloneyFUNDERBURK, Doryce Mcgregory A on: 09/18/2016 10:54 AM   Modules accepted: Orders

## 2016-09-23 ENCOUNTER — Ambulatory Visit (INDEPENDENT_AMBULATORY_CARE_PROVIDER_SITE_OTHER): Payer: BLUE CROSS/BLUE SHIELD | Admitting: Podiatry

## 2016-09-23 ENCOUNTER — Encounter: Payer: Self-pay | Admitting: Podiatry

## 2016-09-23 DIAGNOSIS — E1142 Type 2 diabetes mellitus with diabetic polyneuropathy: Secondary | ICD-10-CM

## 2016-09-24 MED ORDER — NONFORMULARY OR COMPOUNDED ITEM: 2 refills | Status: DC

## 2016-09-24 NOTE — Progress Notes (Signed)
She presents today for follow-up of her diabetic peripheral neuropathy states that she is having more pain in the lateral aspect of her left foot and radiating of her legs she states his pins and needles sensation as she refers to the incision site along the left foot. This is gone on to heal uneventfully however she still has numbness and tingling along the side. She states she really doesn't have any numbness or tingling along the right foot. Last hemoglobin A1c was 7.3 out from his 6.63 months ago.  Objective: Pulses remain palpable neurologic sensorium is intact degenerative reflexes are intact muscle strength is normal.  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 This could be associated with diabetes or previous surgery. At this point I prescribed [employer] compounding cream for neuropathy normal follow-up with her in 5-6 months for diabetic check.

## 2016-09-24 NOTE — Addendum Note (Signed)
Addended by: Alphia Kava'CONNELL, VALERY D on: 09/24/2016 08:24 AM   Modules accepted: Orders

## 2016-10-02 ENCOUNTER — Encounter: Payer: Self-pay | Admitting: Family Medicine

## 2016-10-02 ENCOUNTER — Ambulatory Visit (INDEPENDENT_AMBULATORY_CARE_PROVIDER_SITE_OTHER): Payer: BLUE CROSS/BLUE SHIELD | Admitting: Family Medicine

## 2016-10-02 VITALS — BP 130/82 | HR 102 | Temp 97.6°F | Ht 62.75 in | Wt 172.4 lb

## 2016-10-02 DIAGNOSIS — K219 Gastro-esophageal reflux disease without esophagitis: Secondary | ICD-10-CM | POA: Diagnosis not present

## 2016-10-02 DIAGNOSIS — K589 Irritable bowel syndrome without diarrhea: Secondary | ICD-10-CM

## 2016-10-02 DIAGNOSIS — R1084 Generalized abdominal pain: Secondary | ICD-10-CM

## 2016-10-02 LAB — POCT URINALYSIS DIPSTICK
BILIRUBIN UA: NEGATIVE
Blood, UA: NEGATIVE
GLUCOSE UA: NEGATIVE
KETONES UA: NEGATIVE
Leukocytes, UA: NEGATIVE
Nitrite, UA: NEGATIVE
Protein, UA: NEGATIVE
SPEC GRAV UA: 1.02
UROBILINOGEN UA: 0.2
pH, UA: 6

## 2016-10-02 NOTE — Patient Instructions (Addendum)
Urine dip  Try mirilax 1-2 times daily until soft BM daily   Acid reducer (over the counter nexium or priolosec) once daily for 1 week  No dairy for 2-4 weeks.  Follow up with us or your gastroenterologist if worsening or not improving.

## 2016-10-02 NOTE — Addendum Note (Signed)
Addended by: Sallee LangeFUNDERBURK, JO A on: 10/02/2016 11:02 AM   Modules accepted: Orders

## 2016-10-02 NOTE — Progress Notes (Signed)
HPI:  [name] is a pleasant 50 year old with a past medical history significant for IBS and acid reflux here for an acute visit for abdominal pain. There was a gastrointestinal bug/illness in their home a few weeks ago. She felt like she got this mildly. Since she has had an upset stomach intermittently with occasional cramping abdominal pain, gas, constipation and some acid reflux. Her last bowel movement was about 3 days ago. She has been eating a lot more dairy lately. No fevers, vomiting, diarrhea, melena, hematochezia, inability to tolerate oral intake, hematuria or dysuria. She does however wonder if she has a UTI. She sees a gastroenterologist, but she thought she would check here first she thought this was a GI bug.  ROS: See pertinent positives and negatives per HPI.  Past Medical History:  Diagnosis Date  . ALLERGIC RHINITIS 10/08/2007  . Arthritis   . ASTHMA 04/22/2007  . Diabetes mellitus without complication ([place])   . GERD 04/22/2007  . Headache(784.0) 10/08/2007  . HYPERLIPIDEMIA 04/22/2007  . HYPERTENSION 04/22/2007  . IBS (irritable bowel syndrome)    constipation predominant  . LOW BACK PAIN 06/27/2008  . LUQ PAIN 09/06/2010  . NAUSEA 09/06/2010  . PONV (postoperative nausea and vomiting)     Past Surgical History:  Procedure Laterality Date  . CESAREAN SECTION    . KNEE ARTHROPLASTY Right 04/29/2013   Procedure: COMPUTER ASSISTED TOTAL KNEE ARTHROPLASTY;  Surgeon: Marybelle Killings, MD;  Location: [facility];  Service: Orthopedics;  Laterality: Right;  Right Total Knee Arthroplasty, Cemented  . KNEE SURGERY  92, 96, 01, 08   right  . KNEE SURGERY  88   left  . LUMBAR FUSION  05   L4-5 S1  . NASAL POLYP SURGERY  95   sinus  . TUBAL LIGATION      Family History  Problem Relation Age of Onset  . Hypertension Mother   . Arthritis Mother     OA s/p bilateral hip replacement  . Diabetes Father   . Heart disease Father     CAD  . Hyperlipidemia Father   . Hypertension  Father   . Pulmonary fibrosis Father   . Osteopenia Sister     older with GI problems s/p colectomy  . Cancer Maternal Grandmother     Ovarian Cancer  . Osteopenia Sister     Social History   Social History  . Marital status: Married    Spouse name: N/A  . Number of children: 2  . Years of education: 16   Occupational History  . substitute teacher [employer]   Social History Main Topics  . Smoking status: Never Smoker  . Smokeless tobacco: Never Used  . Alcohol use No  . Drug use: No  . Sexual activity: Yes    Partners: Male   Other Topics Concern  . None   Social History Narrative   Updated 08/09/2015   Work or School: Now retired as of 07/2015; prior work: Hydrologist, part Theatre manager      Home Situation: son with special needs, sick a lot also 82 yo daughter      Spiritual Beliefs: christian      Lifestyle: no regular exercise, diet in so so           Current Outpatient Prescriptions:  .  Acetaminophen (TYLENOL ARTHRITIS PAIN PO), Take 2 tablets by mouth 2 (two) times daily., Disp: , Rfl:  .  albuterol (PROVENTIL HFA;VENTOLIN HFA) 108 (90 BASE) MCG/ACT  inhaler, Inhale 2 puffs into the lungs 4 (four) times daily as needed for wheezing or shortness of breath., Disp: 18 g, Rfl: 11 .  amLODipine (NORVASC) 5 MG tablet, Take 1 tablet (5 mg total) by mouth daily., Disp: 90 tablet, Rfl: 3 .  atorvastatin (LIPITOR) 40 MG tablet, TAKE 1 TABLET BY MOUTH DAILY, Disp: 30 tablet, Rfl: 5 .  baclofen (LIORESAL) 10 MG tablet, TK 1 T PO TID, Disp: , Rfl: 0 .  blood glucose meter kit and supplies KIT, Use to test blood sugar once a day, Disp: 1 each, Rfl: 0 .  butalbital-acetaminophen-caffeine (FIORICET, ESGIC) 50-325-40 MG tablet, Take one tablet not more then every 12 hours only for migraine.Do not use on a frequent basis., Disp: 14 tablet, Rfl: 1 .  cetirizine-pseudoephedrine (ZYRTEC-D) 5-120 MG tablet, Take 1 tablet by mouth., Disp: , Rfl:  .  dicyclomine (BENTYL) 10  MG capsule, Take 1 tab at bedtime.or as directed, Disp: 30 capsule, Rfl: 0 .  escitalopram (LEXAPRO) 5 MG tablet, Take 1 tablet (5 mg total) by mouth daily., Disp: 30 tablet, Rfl: 2 .  glimepiride (AMARYL) 4 MG tablet, Take 1 tablet (4 mg total) by mouth daily before breakfast., Disp: 30 tablet, Rfl: 3 .  losartan-hydrochlorothiazide (HYZAAR) 100-25 MG tablet, Take 1 tablet by mouth daily., Disp: 90 tablet, Rfl: 3 .  montelukast (SINGULAIR) 10 MG tablet, TAKE 1 TABLET (10 MG TOTAL) BY MOUTH DAILY., Disp: 90 tablet, Rfl: 1 .  NONFORMULARY OR COMPOUNDED ITEM, [facility]:  Peripheral Neuropathy Cream - Bupivacaine 1%, Doxepin 3%, Gabapentin 6%, Pentoxifylline 3%, Topiramate 1%, apply 1-2 grams to affected area 3-4 times daily., Disp: 120 each, Rfl: 2 .  omeprazole-sodium bicarbonate (ZEGERID) 40-1100 MG per capsule, Take 1 capsule by mouth daily., Disp: 90 capsule, Rfl: 3 .  Probiotic Product (PROBIOTIC PO), Take by mouth daily., Disp: , Rfl:  .  SAVELLA TITRATION PACK 12.5 & 25 & 50 MG MISC, FOLLOW DIRECTIONS ON DOSE PACK, Disp: , Rfl: 0  EXAM:  Vitals:   10/02/16 1027  BP: 130/82  Pulse: (!) 102  Temp: 97.6 F (36.4 C)    Body mass index is 30.78 kg/m.  GENERAL: vitals reviewed and listed above, alert, oriented, appears well hydrated and in no acute distress  HEENT: atraumatic, conjunttiva clear, no obvious abnormalities on inspection of external nose and ears  NECK: no obvious masses on inspection  LUNGS: clear to auscultation bilaterally, no wheezes, rales or rhonchi, good air movement  CV: HRRR, no peripheral edema  ABD: BS+, soft, NTTP, no CVA TTP, no rebound or guarding  MS: moves all extremities without noticeable abnormality  PSYCH: pleasant and cooperative, no obvious depression or anxiety s ASSESSMENT AND PLAN:  Discussed the following assessment and plan:  Generalized abdominal pain  Irritable bowel syndrome, unspecified type  Gastroesophageal reflux  disease, esophagitis presence not specified  -query post infectious IBS or lactose intolerant type of symptoms -benign exam -udip and culture if needed -try mirilax and no dairy -1 week low does acid reducer for the gerd symptoms -GI or follow up here if worsening or not improving -Patient advised to return or notify a doctor immediately if symptoms worsen or persist or new concerns arise.  Patient Instructions  Urine dip  Try mirilax 1-2 times daily until soft BM daily   Acid reducer (over the counter nexium or priolosec) once daily for 1 week  No dairy for 2-4 weeks.  Follow up with [place] or your gastroenterologist if worsening  or not improving.   Colin Benton R., DO

## 2016-10-02 NOTE — Progress Notes (Signed)
Pre visit review using our clinic review tool, if applicable. No additional management support is needed unless otherwise documented below in the visit note. 

## 2016-10-12 ENCOUNTER — Other Ambulatory Visit: Payer: Self-pay | Admitting: Family Medicine

## 2016-10-19 NOTE — Progress Notes (Signed)
HPI:  [name] is a pleasant 51 yo with a complicated PMH significant for IBS, GERD, Depression, Anxiety, D with polyneuropathy, HTN, HLD, Fibromyalgia, Obesity and asthma here for follow up of several issues:  Depression and anxiety: -going through separation -started lexapro 79m 09/2016 and encouraged increased counseling and exercise -has not taken medication consistently - thought might be cause of her GI issues, though does not seem to be -bowel issues got better with fiber and plan from last month -seeing counselor, started exercising still with some depression and in particular irritability, still wants to try medication -no thoughts self harm  IBS/Gastroenteritis: -at last visit  -reports: seemed to improve with fiber and treatments per last visit - symptoms resolved. Still occ upset stomach at night. -denies: melena, hematochezia, vomiting, unexplained wt loss  DM/Obesity: -metformin upsets her stomach -increased amaryl recently - she was taking 258mtwice daily instead of 4 mg in the morning -she is working on diet (wt watchers) and has been swimming several days per week -no hypoglycemia   ROS: See pertinent positives and negatives per HPI.  Past Medical History:  Diagnosis Date  . ALLERGIC RHINITIS 10/08/2007  . Arthritis   . ASTHMA 04/22/2007  . Diabetes [place]  . GERD 04/22/2007  . Headache(784.0) 10/08/2007  . HYPERLIPIDEMIA 04/22/2007  . HYPERTENSION 04/22/2007  . IBS (irritable bowel syndrome)    constipation predominant  . LOW BACK PAIN 06/27/2008  . LUQ PAIN 09/06/2010  . NAUSEA 09/06/2010  . PONV (postoperative nausea and vomiting)     Past Surgical History:  Procedure Laterality Date  . CESAREAN SECTION    . KNEE ARTHROPLASTY Right 04/29/2013   Procedure: COMPUTER ASSISTED TOTAL KNEE ARTHROPLASTY;  Surgeon: MaMarybelle KillingsMD;  [facility] Service: Orth[facility]ics;  Laterality: Right;  Right Total Knee Arthroplasty,  Cemented  . KNEE SURGERY  92, 96, 01, 08   right  . KNEE SURGERY  88   left  . LUMBAR FUSION  05   L4-5 S1  . NASAL POLYP SURGERY  95   sinus  . TUBAL LIGATION      Family History  Problem Relation Age of Onset  . Hypertension Mother   . Arthritis Mother     OA s/p bilateral hip replacement  . Diabetes Father   . Heart disease Father     CAD  . Hyperlipidemia Father   . Hypertension Father   . Pulmonary fibrosis Father   . Osteopenia Sister     older with GI problems s/p colectomy  . Cancer Maternal Grandmother     Ovarian Cancer  . Osteopenia Sister     Social History   Social History  . Marital status: Married    Spouse name: N/A  . Number of children: 2  . Years of education: 16   Occupational History  . 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 Smoking status: Never Smoker  . Smokeless tobacco: Never Used  . Alcohol use No  . Drug use: No  . Sexual activity: Yes    Partners: Male   Other Topics Concern  . None   Social History Narrative   Updated 08/09/2015   Work or School: Now retired as of 07/2015; prior work: UNHydrologistpart tiTheatre manager    Home Situation: son with special needs, sick a lot also 7 59o daughter      Spiritual Beliefs: christian  Lifestyle: no regular exercise, diet in so so           Current Outpatient Prescriptions:  .  Acetaminophen (TYLENOL ARTHRITIS PAIN PO), Take 2 tablets by mouth 2 (two) times daily., Disp: , Rfl:  .  albuterol (PROVENTIL HFA;VENTOLIN HFA) 108 (90 BASE) MCG/ACT inhaler, Inhale 2 puffs into the lungs 4 (four) times daily as needed for wheezing or shortness of breath., Disp: 18 g, Rfl: 11 .  amLODipine (NORVASC) 5 MG tablet, Take 1 tablet (5 mg total) by mouth daily., Disp: 90 tablet, Rfl: 3 .  atorvastatin (LIPITOR) 40 MG tablet, TAKE 1 TABLET BY MOUTH DAILY, Disp: 30 tablet, Rfl: 5 .  baclofen (LIORESAL) 10 MG tablet, TK 1 T PO TID, Disp: , Rfl: 0 .  blood glucose  meter kit and supplies KIT, Use to test blood sugar once a day, Disp: 1 each, Rfl: 0 .  butalbital-acetaminophen-caffeine (FIORICET, ESGIC) 50-325-40 MG tablet, Take one tablet not more then every 12 hours only for migraine.Do not use on a frequent basis., Disp: 14 tablet, Rfl: 1 .  cetirizine-pseudoephedrine (ZYRTEC-D) 5-120 MG tablet, Take 1 tablet by mouth., Disp: , Rfl:  .  dicyclomine (BENTYL) 10 MG capsule, Take 1 tab at bedtime.or as directed, Disp: 30 capsule, Rfl: 0 .  escitalopram (LEXAPRO) 5 MG tablet, Take 1 tablet (5 mg total) by mouth daily., Disp: 30 tablet, Rfl: 2 .  glimepiride (AMARYL) 4 MG tablet, Take 1 tablet (4 mg total) by mouth daily before breakfast., Disp: 30 tablet, Rfl: 3 .  losartan-hydrochlorothiazide (HYZAAR) 100-25 MG tablet, Take 1 tablet by mouth daily., Disp: 90 tablet, Rfl: 3 .  montelukast (SINGULAIR) 10 MG tablet, TAKE 1 TABLET (10 MG TOTAL) BY MOUTH DAILY., Disp: 90 tablet, Rfl: 1 .  NONFORMULARY OR COMPOUNDED ITEM, [facility]:  Peripheral Neuropathy Cream - Bupivacaine 1%, Doxepin 3%, Gabapentin 6%, Pentoxifylline 3%, Topiramate 1%, apply 1-2 grams to affected area 3-4 times daily., Disp: 120 each, Rfl: 2 .  omeprazole-sodium bicarbonate (ZEGERID) 40-1100 MG per capsule, Take 1 capsule by mouth daily., Disp: 90 capsule, Rfl: 3 .  Probiotic Product (PROBIOTIC PO), Take by mouth daily., Disp: , Rfl:  .  SAVELLA TITRATION PACK 12.5 & 25 & 50 MG MISC, FOLLOW DIRECTIONS ON DOSE PACK, Disp: , Rfl: 0  EXAM:  Vitals:   10/20/16 1100  BP: 126/84  Pulse: 90  Temp: 97.8 F (36.6 C)    Body mass index is 30.34 kg/m.  GENERAL: vitals reviewed and listed above, alert, oriented, appears well hydrated and in no acute distress  HEENT: atraumatic, conjunttiva clear, no obvious abnormalities on inspection of external nose and ears  NECK: no obvious masses on inspection  LUNGS: clear to auscultation bilaterally, no wheezes, rales or rhonchi, good air  movement  CV: HRRR, no peripheral edema  MS: moves all extremities without noticeable abnormality  PSYCH: pleasant and cooperative, no obvious depression or anxiety  ASSESSMENT AND PLAN:  Discussed the following assessment and plan:  Recurrent major depressive disorder, in partial remission ([place])  Type 2 diabetes mellitus with diabetic polyneuropathy, without long-term current use of insulin ([facility])  Irritable bowel syndrome, unspecified type  Gastroesophageal reflux disease, esophagitis presence not specified  Essential hypertension  BMI 30.0-30.9,adult  -trial consistent use ssri -change to amaryl 59m once daily -congratulated and encouraged lifestyle changes, she would really benefit from lifelong healthy lifestyle -GI follow up advised if symptoms persist - potentially related to stress, marital issues and also discussed risks mobic  she is taking from ortho -Patient advised to return or notify a doctor immediately if symptoms worsen or persist or new concerns arise.  Patient Instructions  BEFORE YOU LEAVE: -follow up: 3 months  Congratulations on the healthy changes! I do hope that you start feeling better soon.  Please take the Amaryl 62m daily in the morning.  Please consider decreasing or stopping the meloxicam if possible - it can elevated blood pressure and cause stomach issues.  See if you can take the lexapro once daily consistently for 1 month and let [place] if it helps the mood.   Follow up with your gastroenterologist if continued stomach issues.      KColin BentonR., DO

## 2016-10-20 ENCOUNTER — Ambulatory Visit (INDEPENDENT_AMBULATORY_CARE_PROVIDER_SITE_OTHER): Payer: BLUE CROSS/BLUE SHIELD | Admitting: Family Medicine

## 2016-10-20 ENCOUNTER — Encounter: Payer: Self-pay | Admitting: Family Medicine

## 2016-10-20 VITALS — BP 126/84 | HR 90 | Temp 97.8°F | Ht 62.75 in | Wt 169.9 lb

## 2016-10-20 DIAGNOSIS — I10 Essential (primary) hypertension: Secondary | ICD-10-CM

## 2016-10-20 DIAGNOSIS — K589 Irritable bowel syndrome without diarrhea: Secondary | ICD-10-CM

## 2016-10-20 DIAGNOSIS — F3341 Major depressive disorder, recurrent, in partial remission: Secondary | ICD-10-CM | POA: Diagnosis not present

## 2016-10-20 DIAGNOSIS — E1142 Type 2 diabetes mellitus with diabetic polyneuropathy: Secondary | ICD-10-CM

## 2016-10-20 DIAGNOSIS — K219 Gastro-esophageal reflux disease without esophagitis: Secondary | ICD-10-CM | POA: Diagnosis not present

## 2016-10-20 DIAGNOSIS — Z6830 Body mass index (BMI) 30.0-30.9, adult: Secondary | ICD-10-CM

## 2016-10-20 NOTE — Patient Instructions (Signed)
BEFORE YOU LEAVE: -follow up: 3 months  Congratulations on the healthy changes! I do hope that you start feeling better soon.  Please take the Amaryl 4mg  daily in the morning.  Please consider decreasing or stopping the meloxicam if possible - it can elevated blood pressure and cause stomach issues.  See if you can take the lexapro once daily consistently for 1 month and let us know if it helps the mood.   Follow up with your gastroenterologist if continued stomach issues.

## 2016-10-20 NOTE — Progress Notes (Signed)
Pre visit review using our clinic review tool, if applicable. No additional management support is needed unless otherwise documented below in the visit note. 

## 2016-10-21 ENCOUNTER — Ambulatory Visit: Payer: BLUE CROSS/BLUE SHIELD | Admitting: Family Medicine

## 2016-11-11 LAB — HM DIABETES EYE EXAM

## 2016-11-13 ENCOUNTER — Encounter: Payer: Self-pay | Admitting: Family Medicine

## 2016-11-17 ENCOUNTER — Encounter: Payer: Self-pay | Admitting: Internal Medicine

## 2016-11-18 DIAGNOSIS — M25532 Pain in left wrist: Secondary | ICD-10-CM | POA: Insufficient documentation

## 2016-11-18 DIAGNOSIS — M7712 Lateral epicondylitis, left elbow: Secondary | ICD-10-CM | POA: Insufficient documentation

## 2016-12-06 ENCOUNTER — Other Ambulatory Visit: Payer: Self-pay | Admitting: Family Medicine

## 2016-12-30 ENCOUNTER — Ambulatory Visit: Payer: BLUE CROSS/BLUE SHIELD | Admitting: Podiatry

## 2017-01-19 ENCOUNTER — Ambulatory Visit: Payer: BLUE CROSS/BLUE SHIELD | Admitting: Family Medicine

## 2017-01-23 ENCOUNTER — Other Ambulatory Visit: Payer: Self-pay | Admitting: Family Medicine

## 2017-02-10 ENCOUNTER — Other Ambulatory Visit: Payer: Self-pay | Admitting: *Deleted

## 2017-02-10 MED ORDER — ESCITALOPRAM OXALATE 5 MG PO TABS: 5.0000 mg | ORAL_TABLET | Freq: Every day | ORAL | 5 refills | Status: DC

## 2017-02-10 MED ORDER — GLIMEPIRIDE 4 MG PO TABS: 4.0000 mg | ORAL_TABLET | Freq: Every day | ORAL | 5 refills | Status: DC

## 2017-02-10 NOTE — Telephone Encounter (Signed)
Rx done. 

## 2017-02-12 ENCOUNTER — Other Ambulatory Visit: Payer: Self-pay | Admitting: *Deleted

## 2017-02-12 MED ORDER — ESCITALOPRAM OXALATE 5 MG PO TABS: 5.0000 mg | ORAL_TABLET | Freq: Every day | ORAL | 0 refills | Status: AC

## 2017-02-12 MED ORDER — GLIMEPIRIDE 4 MG PO TABS: 4.0000 mg | ORAL_TABLET | Freq: Every day | ORAL | 0 refills | Status: AC

## 2017-02-12 NOTE — Telephone Encounter (Signed)
Rx done. 

## 2017-03-10 ENCOUNTER — Ambulatory Visit (INDEPENDENT_AMBULATORY_CARE_PROVIDER_SITE_OTHER): Payer: BLUE CROSS/BLUE SHIELD | Admitting: Podiatry

## 2017-03-10 DIAGNOSIS — E1142 Type 2 diabetes mellitus with diabetic polyneuropathy: Secondary | ICD-10-CM | POA: Diagnosis not present

## 2017-03-11 NOTE — Progress Notes (Signed)
She presents today for follow-up of her diabetes. States she had hammertoe surgery to remove a spur to the hallux left. She states it is still sore really hasn't healed yet. Had a different doctor from a different practice do this.  Objective: Vital signs are stable she is alert and oriented 3. Pulses are palpable neurologic sensorium appears to be intact deep tendon reflexes are intact muscle strength is normal bilateral. Orthopedic evaluation shows all joints distal to the ankle for range of motion without crepitation. Cutaneous evaluation of a straight supple well-hydrated cutis no erythema edema cellulitis drainage or odor.  Assessment: Diabetes wellness check appears to be in good shape.  Plan: Follow-up with us on an as-needed basis or in 6 months.

## 2017-04-03 ENCOUNTER — Other Ambulatory Visit: Payer: Self-pay | Admitting: Family Medicine

## 2017-06-25 ENCOUNTER — Encounter: Payer: Self-pay | Admitting: Family Medicine

## 2017-09-10 ENCOUNTER — Encounter: Payer: Self-pay | Admitting: Podiatry

## 2017-09-10 ENCOUNTER — Ambulatory Visit: Payer: BC Managed Care – PPO | Admitting: Podiatry

## 2017-09-10 DIAGNOSIS — Q828 Other specified congenital malformations of skin: Secondary | ICD-10-CM

## 2017-09-10 DIAGNOSIS — E1142 Type 2 diabetes mellitus with diabetic polyneuropathy: Secondary | ICD-10-CM | POA: Diagnosis not present

## 2017-09-10 DIAGNOSIS — M7751 Other enthesopathy of right foot: Secondary | ICD-10-CM | POA: Diagnosis not present

## 2017-09-10 DIAGNOSIS — M779 Enthesopathy, unspecified: Secondary | ICD-10-CM

## 2017-09-10 DIAGNOSIS — M778 Other enthesopathies, not elsewhere classified: Secondary | ICD-10-CM

## 2017-09-10 NOTE — Progress Notes (Signed)
She presents today for follow-up of her diabetes and diabetic peripheral neuropathy.  She states that she is recently divorced.  She states that her hemoglobin A1c is down to 6.2.  She demonstrates no worse neuropathy.  She is complaining of pain beneath the fifth metatarsal of the left foot.  Objective: Vital signs are stable alert and oriented x3.  Pulses are palpable.  Neurologic sensorium is slightly diminished along the lateral aspect of the left foot more than likely due to perineal surgery.  She has had some diminished sensation on vibratory sensation to the right foot.  Minimally so on the left.  No open lesions or wounds are noted.  She does have pain on palpation sub-fifth metatarsal head of the left foot.  Reactive hyperkeratosis is present but no skin breakdown is present.  Assessment: Capsulitis of the fifth metatarsal phalangeal joint left with a benign soft tissue lesion beneath the fifth metatarsal head.  She also has diabetes mellitus with limited diabetic peripheral neuropathy.  Plan: At this point I injected the fifth metatarsal phalangeal joint with dexamethasone local anesthetic I then debrided the reactive hyperkeratosis.  The injection was performed with 2 mg of dexamethasone and 2.5 mg of Marcaine.  This was injected after sterile Betadine skin prep.  Verbal consent was given for this.  She tolerated procedure well and commented that she felt much better.  I will follow-up with her in 6 months for her routine diabetic check.

## 2017-10-15 ENCOUNTER — Encounter: Payer: Self-pay | Admitting: Family Medicine

## 2018-01-11 DIAGNOSIS — M26629 Arthralgia of temporomandibular joint, unspecified side: Secondary | ICD-10-CM | POA: Insufficient documentation

## 2018-01-11 DIAGNOSIS — H9203 Otalgia, bilateral: Secondary | ICD-10-CM | POA: Insufficient documentation

## 2018-04-06 ENCOUNTER — Ambulatory Visit (INDEPENDENT_AMBULATORY_CARE_PROVIDER_SITE_OTHER): Payer: BC Managed Care – PPO

## 2018-04-06 ENCOUNTER — Ambulatory Visit: Payer: BC Managed Care – PPO | Admitting: Podiatry

## 2018-04-06 ENCOUNTER — Encounter: Payer: Self-pay | Admitting: Podiatry

## 2018-04-06 DIAGNOSIS — M722 Plantar fascial fibromatosis: Secondary | ICD-10-CM

## 2018-04-06 MED ORDER — MELOXICAM 15 MG PO TABS: 15.0000 mg | ORAL_TABLET | Freq: Every day | ORAL | 1 refills | Status: AC

## 2018-04-11 ENCOUNTER — Other Ambulatory Visit: Payer: Self-pay | Admitting: Family Medicine

## 2018-04-12 NOTE — Progress Notes (Signed)
   Subjective: 52 year old female presenting today with a chief complaint of pain noted to the bilateral feet that began 2-3 months ago. She states the pain is similar to her previous plantar fasciitis flare up. She states the pain is located to the posterior and plantar heels. Walking increases the pain. She has not done anything for treatment. Patient is here for further evaluation and treatment.   Past Medical History:  Diagnosis Date  . ALLERGIC RHINITIS 10/08/2007  . Arthritis   . ASTHMA 04/22/2007  . Diabetes mellitus without complication ([facility])   . GERD 04/22/2007  . Headache(784.0) 10/08/2007  . HYPERLIPIDEMIA 04/22/2007  . HYPERTENSION 04/22/2007  . IBS (irritable bowel syndrome)    constipation predominant  . LOW BACK PAIN 06/27/2008  . LUQ PAIN 09/06/2010  . NAUSEA 09/06/2010  . PONV (postoperative nausea and vomiting)      Objective: Physical Exam General: The patient is alert and oriented x3 in no acute distress.  Dermatology: Skin is warm, dry and supple bilateral lower extremities. Negative for open lesions or macerations bilateral.   Vascular: Dorsalis Pedis and Posterior Tibial pulses palpable bilateral.  Capillary fill time is immediate to all digits.  Neurological: Epicritic and protective threshold intact bilateral.   Musculoskeletal: Tenderness to palpation to the plantar aspect of the bilateral heels along the plantar fascia. All other joints range of motion within normal limits bilateral. Strength 5/5 in all groups bilateral.   Radiographic exam: Normal osseous mineralization. Joint spaces preserved. No fracture/dislocation/boney destruction. No other soft tissue abnormalities or radiopaque foreign bodies.   Assessment: 1. plantar fasciitis bilateral feet  Plan of Care:  1. Patient evaluated. Xrays reviewed.   2. Injection of 0.5cc Celestone soluspan injected into the bilateral heels.  3. Rx for Meloxicam provided to patient.  4. Recommended good shoe gear.    5. Return to clinic as needed.   [place] [employer] [place].   Felecia ShellingBrent M. Evans, DPM [facility]  Dr. Felecia ShellingBrent M. Evans, DPM    [address]                                   [place], [place] [id number]                Office [phone]  Fax [phone]

## 2018-04-20 ENCOUNTER — Telehealth: Payer: Self-pay | Admitting: Podiatry

## 2018-04-20 MED ORDER — METHYLPREDNISOLONE 4 MG PO TBPK: ORAL_TABLET | ORAL | 0 refills | Status: DC

## 2018-04-20 NOTE — Telephone Encounter (Signed)
Dr. Logan BoresEvans ordered Medrol dose pack if she would like. I asked pt if her glucose was under good control and she stated yes. Offered pt the Medrol dose pack and she stated she would take the dose pack. I told pt she could not take the meloxicam with the Medrol, but once the steroid pack was complete she could begin the Meloxicam again. Pt states she has taken steroid dose packs before.

## 2018-04-20 NOTE — Telephone Encounter (Signed)
I saw Dr. Logan BoresEvans two weeks ago and got cortisone shots. My right plantar fasciitis is still bothering me right much. So I'm calling to see if I can increase the meloxicam dose or take it more than once to help with the pain? I'm about to go on a cruise for vacation on Friday. I'm trying to see if I can get an answer for that before I go and while I am gone next week. The phone number is [phone]. Thank you. Bye bye.

## 2018-04-20 NOTE — Telephone Encounter (Signed)
I informed pt, mobic was not to be taken more than once daily and if she could add regular strength tylenol for added pain control. Pt states she takes Arthritis Strength Tylenol and that is not helping. I encouraged pt to be consistent with her ice therapy at least 3-4 times daily for at least 15 minutes/session protecting the skin from the ice with a light cloth. Pt states she has been doing that since May. I told pt I would message Dr. Logan BoresEvans and call again if there were changes.

## 2018-04-20 NOTE — Addendum Note (Signed)
Addended by: Alphia Kava'CONNELL, Dodger Sinning D on: 04/20/2018 02:28 PM   Modules accepted: Orders

## 2018-06-10 ENCOUNTER — Encounter: Payer: Self-pay | Admitting: Podiatry

## 2018-06-10 ENCOUNTER — Ambulatory Visit: Payer: BC Managed Care – PPO | Admitting: Podiatry

## 2018-06-10 DIAGNOSIS — M722 Plantar fascial fibromatosis: Secondary | ICD-10-CM | POA: Diagnosis not present

## 2018-06-10 MED ORDER — GABAPENTIN 300 MG PO CAPS: 300.0000 mg | ORAL_CAPSULE | Freq: Three times a day (TID) | ORAL | 3 refills | Status: DC

## 2018-06-11 ENCOUNTER — Telehealth: Payer: Self-pay | Admitting: *Deleted

## 2018-06-11 DIAGNOSIS — M722 Plantar fascial fibromatosis: Secondary | ICD-10-CM

## 2018-06-11 NOTE — Telephone Encounter (Signed)
-----   Message from Kristian Covey, [facility] sent at 06/10/2018  3:59 PM EDT ----- Regarding: PT Referral to [facility]  Evaluate and treat - plantar fasciitis bilateral   Duration: 3 x week x 4 weeks

## 2018-06-11 NOTE — Telephone Encounter (Signed)
Faxed required form, and demographics to [facility].

## 2018-06-11 NOTE — Telephone Encounter (Signed)
Entered in error

## 2018-06-11 NOTE — Telephone Encounter (Signed)
-----   Message from Ashley E Prevette, [facility] sent at 06/10/2018  3:59 PM EDT ----- Regarding: PT Referral to [facility]  Evaluate and treat - plantar fasciitis bilateral   Duration: 3 x week x 4 weeks 

## 2018-06-12 NOTE — Progress Notes (Signed)
She presents today after having not seen her since December of last year with a chief complaint of bilateral heel pain.  States that is still hurting a lot in the heels but is also hurting on the outside of the foot.  Objective: Vital signs are stable alert and oriented x3.  Pulses are palpable.  Palpation medial calcaneal tubercles bilateral.  Assessment: Plantar fasciitis bilateral.  Plan: Injected bilateral heels today after sterile Betadine skin prep 20 mg Kenalog 5 mg Marcaine bilateral.  Should this not alleviate the pain to the lateral aspect of the foot follow-up with her in 1 month for another set of injections to that area.

## 2018-07-03 ENCOUNTER — Other Ambulatory Visit: Payer: Self-pay | Admitting: Podiatry

## 2018-07-28 ENCOUNTER — Other Ambulatory Visit: Payer: Self-pay

## 2018-07-28 MED ORDER — MELOXICAM 15 MG PO TABS: 15.0000 mg | ORAL_TABLET | Freq: Every day | ORAL | 0 refills | Status: AC

## 2018-12-03 ENCOUNTER — Encounter (HOSPITAL_BASED_OUTPATIENT_CLINIC_OR_DEPARTMENT_OTHER): Payer: Self-pay | Admitting: Emergency Medicine

## 2018-12-03 ENCOUNTER — Emergency Department (HOSPITAL_BASED_OUTPATIENT_CLINIC_OR_DEPARTMENT_OTHER)
Admission: EM | Admit: 2018-12-03 | Discharge: 2018-12-03 | Disposition: A | Discharge status: Home / Self Care | Payer: BC Managed Care – PPO | Attending: Emergency Medicine | Admitting: Emergency Medicine

## 2018-12-03 ENCOUNTER — Other Ambulatory Visit: Payer: Self-pay

## 2018-12-03 DIAGNOSIS — E119 Type 2 diabetes mellitus without complications: Secondary | ICD-10-CM | POA: Insufficient documentation

## 2018-12-03 DIAGNOSIS — H1032 Unspecified acute conjunctivitis, left eye: Secondary | ICD-10-CM | POA: Insufficient documentation

## 2018-12-03 DIAGNOSIS — J45909 Unspecified asthma, uncomplicated: Secondary | ICD-10-CM | POA: Diagnosis not present

## 2018-12-03 DIAGNOSIS — I10 Essential (primary) hypertension: Secondary | ICD-10-CM | POA: Diagnosis not present

## 2018-12-03 DIAGNOSIS — R05 Cough: Secondary | ICD-10-CM | POA: Diagnosis present

## 2018-12-03 DIAGNOSIS — Z79899 Other long term (current) drug therapy: Secondary | ICD-10-CM | POA: Insufficient documentation

## 2018-12-03 DIAGNOSIS — J069 Acute upper respiratory infection, unspecified: Secondary | ICD-10-CM | POA: Diagnosis not present

## 2018-12-03 MED ORDER — ERYTHROMYCIN 5 MG/GM OP OINT: 1.0000 "application " | TOPICAL_OINTMENT | Freq: Four times a day (QID) | OPHTHALMIC | 1 refills | Status: AC

## 2018-12-03 MED ORDER — FLUTICASONE PROPIONATE 50 MCG/ACT NA SUSP: 1.0000 | Freq: Every day | NASAL | 0 refills | Status: DC

## 2018-12-03 MED FILL — ERYTHROMYCIN EYE OINTMENT: 5 | 7 days supply | Qty: 4 | Fill #0

## 2018-12-03 MED FILL — FLUTICASONE PROP 50 MCG SPR: 50 | 60 days supply | Qty: 16 | Fill #0

## 2018-12-03 NOTE — ED Provider Notes (Signed)
[facility] EMERGENCY DEPARTMENT Provider Note   CSN: [id number] Arrival date & time: 12/03/18  1008    History   Chief Complaint Chief Complaint  Patient presents with  . URI    HPI [name] is a 53 y.o. female.     53 year old female with past medical story below including asthma, type 2 diabetes mellitus, hypertension, hyperlipidemia, fibromyalgia, IBS who presents with multiple complaints including cough, nasal congestion, and eye drainage.  Patient states that since November 2019, she has had persistent symptoms of cough, nasal congestion, and sinus drainage.  She has seen her primary care provider 10 times for these symptoms and has been referred to a pulmonologist.  She has a appointment with pulmonology this upcoming week.  She states, "I just want to get a different perspective." This week, she has developed sore throat, left ear pain, and over the past few days, drainage and redness of her left eye.  She is a Oncologist.  She denies any fevers.  The history is provided by the patient.  URI    Past Medical History:  Diagnosis Date  . ALLERGIC RHINITIS 10/08/2007  . Arthritis   . ASTHMA 04/22/2007  . Diabetes mellitus without complication ([place])   . GERD 04/22/2007  . Headache(784.0) 10/08/2007  . HYPERLIPIDEMIA 04/22/2007  . HYPERTENSION 04/22/2007  . IBS (irritable bowel syndrome)    constipation predominant  . LOW BACK PAIN 06/27/2008  . LUQ PAIN 09/06/2010  . NAUSEA 09/06/2010  . PONV (postoperative nausea and vomiting)     Patient Active Problem List   Diagnosis Date Noted  . Fibromyalgia 09/14/2016  . IBS (irritable bowel syndrome) 09/14/2016  . Obesity 09/14/2016  . Anxiety state 08/01/2013  . Osteoarthritis of right knee 04/29/2013    Class: Diagnosis of  . Allergic rhinitis 10/08/2007  . Headache(784.0) 10/08/2007  . Diabetes mellitus ([place]) 10/08/2007  . Hyperlipemia 04/22/2007  . Essential hypertension 04/22/2007  . Asthma  04/22/2007  . GERD 04/22/2007    Past Surgical History:  Procedure Laterality Date  . CESAREAN SECTION    . KNEE ARTHROPLASTY Right 04/29/2013   Procedure: COMPUTER ASSISTED TOTAL KNEE ARTHROPLASTY;  Surgeon: Marybelle Killings, MD;  Location: [facility];  Service: Orthopedics;  Laterality: Right;  Right Total Knee Arthroplasty, Cemented  . KNEE SURGERY  92, 96, 01, 08   right  . KNEE SURGERY  88   left  . LUMBAR FUSION  05   L4-5 S1  . NASAL POLYP SURGERY  95   sinus  . TUBAL LIGATION       OB History   No obstetric history on file.      Home Medications    Prior to Admission medications   Medication Sig Start Date End Date Taking? Authorizing Provider  Acetaminophen (TYLENOL ARTHRITIS PAIN PO) Take 2 tablets by mouth 2 (two) times daily.    [provider]  albuterol (PROVENTIL HFA;VENTOLIN HFA) 108 (90 BASE) MCG/ACT inhaler Inhale 2 puffs into the lungs 4 (four) times daily as needed for wheezing or shortness of breath. 10/04/13   Norins, Heinz Knuckles, MD  amLODipine (NORVASC) 5 MG tablet TAKE 1 TABLET BY MOUTH DAILY. 04/03/17   Lucretia Kern, DO  atorvastatin (LIPITOR) 40 MG tablet TAKE 1 TABLET BY MOUTH DAILY 01/23/17   Lucretia Kern, DO  baclofen (LIORESAL) 10 MG tablet TK 1 T PO TID 08/01/15   [provider]  blood glucose meter kit and supplies KIT Use to  test blood sugar once a day 09/17/15   Lucretia Kern, DO  butalbital-acetaminophen-caffeine (FIORICET, ESGIC) [id number] MG tablet Take one tablet not more then every 12 hours only for migraine.Do not use on a frequent basis. 09/17/15   Lucretia Kern, DO  cetirizine-pseudoephedrine (ZYRTEC-D) 5-120 MG tablet Take 1 tablet by mouth.    [provider]  dicyclomine (BENTYL) 10 MG capsule Take 1 tab at bedtime.or as directed 12/12/15   Panosh, Standley Brooking, MD  Diethylpropion HCl 25 MG TABS TAKE 1 TABLET BY MOUTH BEFORE EVERY MEAL 03/19/18   [provider]  erythromycin ophthalmic ointment Place 1 application  into the left eye every 6 (six) hours for 5 days. Place 1/2 inch ribbon of ointment in the affected eye 4 times a day 12/03/18 12/08/18  Dafna Romo, Wenda Overland, MD  escitalopram (LEXAPRO) 5 MG tablet Take 1 tablet (5 mg total) by mouth daily. 02/12/17   Lucretia Kern, DO  fluticasone (FLONASE) 50 MCG/ACT nasal spray Place 1 spray into both nostrils daily. 12/03/18   Moni Rothrock, Wenda Overland, MD  gabapentin (NEURONTIN) 300 MG capsule TAKE 1 CAPSULE BY MOUTH THREE TIMES A DAY 07/05/18   Hyatt, Max T, DPM  glimepiride (AMARYL) 4 MG tablet Take 1 tablet (4 mg total) by mouth daily before breakfast. 02/12/17   Lucretia Kern, DO  glucose blood (TRUE METRIX BLOOD GLUCOSE TEST) test strip USE UTD 09/09/17   [provider]  losartan-hydrochlorothiazide (HYZAAR) 100-25 MG tablet TAKE 1 TABLET BY MOUTH EVERY DAY 04/03/17   Lucretia Kern, DO  meloxicam (MOBIC) 15 MG tablet Take 1 tablet (15 mg total) by mouth daily. 07/28/18   Edrick Kins, DPM  montelukast (SINGULAIR) 10 MG tablet TAKE 1 TABLET BY MOUTH EVERY DAY 04/12/18   Lucretia Kern, DO  NONFORMULARY OR COMPOUNDED ITEM [facility]:  Peripheral Neuropathy Cream - Bupivacaine 1%, Doxepin 3%, Gabapentin 6%, Pentoxifylline 3%, Topiramate 1%, apply 1-2 grams to affected area 3-4 times daily. 09/24/16   Hyatt, Max T, DPM  omeprazole-sodium bicarbonate (ZEGERID) 40-1100 MG per capsule Take 1 capsule by mouth daily. 03/16/15   Lucretia Kern, DO  phentermine (ADIPEX-P) 37.5 MG tablet Take 37.5 mg by mouth daily. 02/02/18   [provider]  Probiotic Product (PROBIOTIC PO) Take by mouth daily.    [provider]  SAVELLA TITRATION PACK 12.5 & 25 & 50 MG MISC FOLLOW DIRECTIONS ON DOSE PACK 02/29/16   [provider]  TRUEPLUS LANCETS 33G MISC USE UTD 09/09/17   [provider]    Family History Family History  Problem Relation Age of Onset  . Diabetes Father   . Heart disease Father        CAD  . Hyperlipidemia Father   .  Hypertension Father   . Pulmonary fibrosis Father   . Hypertension Mother   . Arthritis Mother        OA s/p bilateral hip replacement  . Osteopenia Sister        older with GI problems s/p colectomy  . Cancer Maternal Grandmother        Ovarian Cancer  . Osteopenia Sister     Social History Social History   Tobacco Use  . Smoking status: Never Smoker  . Smokeless tobacco: Never Used  Substance Use Topics  . Alcohol use: No    Alcohol/week: 0.0 standard drinks  . Drug use: No     Allergies   Metformin and related; Onglyza [saxagliptin]; and  Penicillins   Review of Systems Review of Systems All other systems reviewed and are negative except that which was mentioned in HPI   Physical Exam Updated Vital Signs BP 139/79 (BP Location: Left Arm)   Pulse 90   Temp 98 F (36.7 C) (Oral)   Resp 20   Ht _0  (1.626 m)   Wt 81.6 kg   LMP 08/10/2014   SpO2 97%   BMI 30.90 kg/m   Physical Exam Vitals signs and nursing note reviewed.  Constitutional:      General: She is not in acute distress.    Appearance: She is well-developed.  HENT:     Head: Normocephalic and atraumatic.     Right Ear: Tympanic membrane and ear canal normal.     Left Ear: Tympanic membrane and ear canal normal.     Mouth/Throat:     Mouth: Mucous membranes are moist.     Comments: Mild erythema posterior oropharynx Eyes:     Pupils: Pupils are equal, round, and reactive to light.     Comments: Small amount of crusted drainage L eye with faint erythema of conjunctiva  Neck:     Musculoskeletal: Neck supple. No neck rigidity.  Cardiovascular:     Rate and Rhythm: Normal rate and regular rhythm.     Heart sounds: Normal heart sounds. No murmur.  Pulmonary:     Effort: Pulmonary effort is normal.     Breath sounds: Normal breath sounds.  Abdominal:     General: Bowel sounds are normal. There is no distension.     Palpations: Abdomen is soft.     Tenderness: There is no abdominal  tenderness.  Lymphadenopathy:     Cervical: No cervical adenopathy.  Skin:    General: Skin is warm and dry.  Neurological:     Mental Status: She is alert and oriented to person, place, and time.     Comments: Fluent speech  Psychiatric:     Comments: Bizarre affect      ED Treatments / Results  Labs (all labs ordered are listed, but only abnormal results are displayed) Labs Reviewed - No data to display  EKG None  Radiology No results found.  Procedures Procedures (including critical care time)  Medications Ordered in ED Medications - No data to display   Initial Impression / Assessment and Plan / ED Course  I have reviewed the triage vital signs and the nursing notes.         Reviewed the patient's chart and it appears that she follows with ENT in the past and has had previous sinus surgery.  I recommended that she contact ENT for follow-up and also continue to follow-up with pulmonology regarding her chronic symptoms.  Regarding new symptoms this week of sore throat, ear pain, and eye drainage, suspect viral URI.  Because of unilateral symptoms and unilateral drainage, provided with erythromycin to cover for bacterial process.  Discussed supportive measures.  Final Clinical Impressions(s) / ED Diagnoses   Final diagnoses:  Viral upper respiratory tract infection  Acute conjunctivitis of left eye, unspecified acute conjunctivitis type    ED Discharge Orders         Ordered    erythromycin ophthalmic ointment  Every 6 hours     12/03/18 1125    fluticasone (FLONASE) 50 MCG/ACT nasal spray  Daily     12/03/18 1125           Kennen Stammer, Wenda Overland, MD 12/03/18 1149

## 2018-12-03 NOTE — ED Triage Notes (Addendum)
Since Nov 2019 has been to the MD x 10, for URI symptoms, to get a different perspective. Today has nasal congestion, cough and drainage from OU . Refuses to wear mask , states," I can't breath wearing those "

## 2019-01-19 ENCOUNTER — Telehealth: Payer: Self-pay | Admitting: *Deleted

## 2019-01-19 MED ORDER — NONFORMULARY OR COMPOUNDED ITEM: 5 refills | Status: DC

## 2019-01-19 NOTE — Telephone Encounter (Signed)
Left message informing pt the cream would be coming from another [place] [phone] compound pharmacy, and to make an appt to discuss the gabapentin.

## 2019-01-19 NOTE — Telephone Encounter (Signed)
Faxed orders for [facility] for the Custom Pain Cream formula from [facility].

## 2019-01-19 NOTE — Telephone Encounter (Signed)
Pt states she would like a refill of the [facility] with ibuprofen, baclofen, and lidocaine, and that she did not believe the gabapentin was helping that much.

## 2019-04-07 ENCOUNTER — Other Ambulatory Visit: Payer: Self-pay | Admitting: *Deleted

## 2019-04-07 MED ORDER — GABAPENTIN 300 MG PO CAPS: ORAL_CAPSULE | ORAL | 2 refills | Status: DC

## 2019-04-07 NOTE — Telephone Encounter (Signed)
Sent refill gabapentin to pharmacy

## 2019-05-23 ENCOUNTER — Other Ambulatory Visit (HOSPITAL_COMMUNITY): Payer: Self-pay | Admitting: Gastroenterology

## 2019-05-23 ENCOUNTER — Other Ambulatory Visit: Payer: Self-pay | Admitting: Gastroenterology

## 2019-05-23 DIAGNOSIS — R1013 Epigastric pain: Secondary | ICD-10-CM

## 2019-06-01 ENCOUNTER — Encounter (HOSPITAL_COMMUNITY): Payer: Self-pay

## 2019-06-01 ENCOUNTER — Encounter (HOSPITAL_COMMUNITY): Payer: BC Managed Care – PPO

## 2019-06-06 ENCOUNTER — Encounter (HOSPITAL_COMMUNITY)
Admission: RE | Admit: 2019-06-06 | Discharge: 2019-06-06 | Disposition: A | Discharge status: Home / Self Care | Payer: BC Managed Care – PPO | Source: Ambulatory Visit | Attending: Gastroenterology | Admitting: Gastroenterology

## 2019-06-06 ENCOUNTER — Other Ambulatory Visit: Payer: Self-pay

## 2019-06-06 DIAGNOSIS — R1013 Epigastric pain: Secondary | ICD-10-CM | POA: Diagnosis present

## 2019-06-06 MED ORDER — TECHNETIUM TC 99M MEBROFENIN IV KIT
5.0000 | PACK | Freq: Once | INTRAVENOUS | Status: AC | PRN
  Administered 2019-06-06: 07:00:00 5 via INTRAVENOUS

## 2019-11-03 ENCOUNTER — Ambulatory Visit: Payer: BC Managed Care – PPO | Admitting: Podiatry

## 2019-11-03 ENCOUNTER — Encounter: Payer: Self-pay | Admitting: Podiatry

## 2019-11-03 ENCOUNTER — Other Ambulatory Visit: Payer: Self-pay

## 2019-11-03 DIAGNOSIS — M722 Plantar fascial fibromatosis: Secondary | ICD-10-CM | POA: Diagnosis not present

## 2019-11-03 DIAGNOSIS — E1142 Type 2 diabetes mellitus with diabetic polyneuropathy: Secondary | ICD-10-CM | POA: Diagnosis not present

## 2019-11-03 NOTE — Progress Notes (Signed)
She presents today for her annual diabetic foot exam.  States that the majority of her symptoms are associated with neuropathy at nighttime.  She also notices some redness to the hallux on the right foot.  Objective: Vital signs are stable she is alert and oriented x3.  Pulses are palpable.  Neuropathy is obvious with some mild erythema around the hallux right there is no eponychium or proximal nail fold present it appears that this is been damaged or has had some inflammatory process.  Her tendons are normal other skin is normal no open lesions or lacerations.  Muscle strength is normal and symmetrical bilateral DP reflexes are intact  Assessment: Diabetic neuropathy mild paronychia proximal nail fold hallux right.  Plan: Instructed her to soak in Epson salts and warm water and apply a small amount of Neosporin or topical antibiotic ointment to the proximal nail fold hallux right.  I also recommended gabapentin or Lyrica she states that it caused gastric issues with her so she is unable to use those.  She will continue to use the lidocaine cream or patches at nighttime other than that she is unable to take anything else.  Follow-up with her in 6 months or she will call sooner if needed.

## 2020-04-11 ENCOUNTER — Other Ambulatory Visit: Payer: Self-pay

## 2020-04-11 ENCOUNTER — Encounter (HOSPITAL_BASED_OUTPATIENT_CLINIC_OR_DEPARTMENT_OTHER): Payer: Self-pay

## 2020-04-11 ENCOUNTER — Emergency Department (HOSPITAL_BASED_OUTPATIENT_CLINIC_OR_DEPARTMENT_OTHER)
Admission: EM | Admit: 2020-04-11 | Discharge: 2020-04-11 | Disposition: A | Discharge status: Home / Self Care | Payer: BC Managed Care – PPO | Attending: Emergency Medicine | Admitting: Emergency Medicine

## 2020-04-11 ENCOUNTER — Emergency Department (HOSPITAL_BASED_OUTPATIENT_CLINIC_OR_DEPARTMENT_OTHER): Payer: BC Managed Care – PPO

## 2020-04-11 DIAGNOSIS — Z79899 Other long term (current) drug therapy: Secondary | ICD-10-CM | POA: Diagnosis not present

## 2020-04-11 DIAGNOSIS — E119 Type 2 diabetes mellitus without complications: Secondary | ICD-10-CM | POA: Insufficient documentation

## 2020-04-11 DIAGNOSIS — Y929 Unspecified place or not applicable: Secondary | ICD-10-CM | POA: Insufficient documentation

## 2020-04-11 DIAGNOSIS — Y939 Activity, unspecified: Secondary | ICD-10-CM | POA: Insufficient documentation

## 2020-04-11 DIAGNOSIS — Z7984 Long term (current) use of oral hypoglycemic drugs: Secondary | ICD-10-CM | POA: Insufficient documentation

## 2020-04-11 DIAGNOSIS — S0990XA Unspecified injury of head, initial encounter: Secondary | ICD-10-CM | POA: Diagnosis present

## 2020-04-11 DIAGNOSIS — R519 Headache, unspecified: Secondary | ICD-10-CM

## 2020-04-11 DIAGNOSIS — S060X0A Concussion without loss of consciousness, initial encounter: Secondary | ICD-10-CM | POA: Insufficient documentation

## 2020-04-11 DIAGNOSIS — Y999 Unspecified external cause status: Secondary | ICD-10-CM | POA: Insufficient documentation

## 2020-04-11 DIAGNOSIS — I10 Essential (primary) hypertension: Secondary | ICD-10-CM | POA: Insufficient documentation

## 2020-04-11 DIAGNOSIS — J45909 Unspecified asthma, uncomplicated: Secondary | ICD-10-CM | POA: Insufficient documentation

## 2020-04-11 MED ORDER — BUTALBITAL-APAP-CAFFEINE 50-325-40 MG PO TABS: 1.0000 | ORAL_TABLET | Freq: Three times a day (TID) | ORAL | 0 refills | Status: AC | PRN

## 2020-04-11 MED FILL — BUTALB-ACETAMIN-CAFF 50-325: 50-325-40 | 2 days supply | Qty: 12 | Fill #0

## 2020-04-11 NOTE — ED Provider Notes (Signed)
[facility] EMERGENCY DEPARTMENT Provider Note   CSN: [id number] Arrival date & time: 04/11/20  1358     History Chief Complaint  Patient presents with  . Head Injury    [name] is a 54 y.o. female.  The history is provided by the patient and medical records. No language interpreter was used.  Head Injury Location:  R parietal and occipital Time since incident:  9 days Mechanism of injury: direct blow   Pain details:    Quality:  Aching and dull   Severity:  Moderate   Timing:  Intermittent   Progression:  Waxing and waning Chronicity:  New Relieved by:  Nothing Worsened by:  Position changes Ineffective treatments:  None tried Associated symptoms: neck pain   Associated symptoms: no blurred vision, no difficulty breathing, no disorientation, no double vision, no focal weakness, no headaches, no loss of consciousness, no memory loss, no nausea, no numbness, no seizures and no vomiting        Past Medical History:  Diagnosis Date  . ALLERGIC RHINITIS 10/08/2007  . Arthritis   . ASTHMA 04/22/2007  . Diabetes mellitus without complication ([place])   . GERD 04/22/2007  . Headache(784.0) 10/08/2007  . HYPERLIPIDEMIA 04/22/2007  . HYPERTENSION 04/22/2007  . IBS (irritable bowel syndrome)    constipation predominant  . LOW BACK PAIN 06/27/2008  . LUQ PAIN 09/06/2010  . NAUSEA 09/06/2010  . PONV (postoperative nausea and vomiting)     Patient Active Problem List   Diagnosis Date Noted  . Referred otalgia of both ears 01/11/2018  . Temporomandibular joint (TMJ) pain 01/11/2018  . Lateral epicondylitis of left elbow 11/18/2016  . Left wrist pain 11/18/2016  . Fibromyalgia 09/14/2016  . IBS (irritable bowel syndrome) 09/14/2016  . Obesity 09/14/2016  . Anxiety state 08/01/2013  . Osteoarthritis of right knee 04/29/2013    Class: Diagnosis of  . Allergic rhinitis 10/08/2007  . Headache(784.0) 10/08/2007  . Diabetes mellitus ([place]) 10/08/2007  . Hyperlipemia  04/22/2007  . Essential hypertension 04/22/2007  . Asthma 04/22/2007  . GERD 04/22/2007    Past Surgical History:  Procedure Laterality Date  . CESAREAN SECTION    . KNEE ARTHROPLASTY Right 04/29/2013   Procedure: COMPUTER ASSISTED TOTAL KNEE ARTHROPLASTY;  Surgeon: Marybelle Killings, MD;  Location: [facility];  Service: Orthopedics;  Laterality: Right;  Right Total Knee Arthroplasty, Cemented  . KNEE SURGERY  92, 96, 01, 08   right  . KNEE SURGERY  88   left  . LUMBAR FUSION  05   L4-5 S1  . NASAL POLYP SURGERY  95   sinus  . TUBAL LIGATION       OB History   No obstetric history on file.     Family History  Problem Relation Age of Onset  . Diabetes Father   . Heart disease Father        CAD  . Hyperlipidemia Father   . Hypertension Father   . Pulmonary fibrosis Father   . Hypertension Mother   . Arthritis Mother        OA s/p bilateral hip replacement  . Osteopenia Sister        older with GI problems s/p colectomy  . Cancer Maternal Grandmother        Ovarian Cancer  . Osteopenia Sister     Social History   Tobacco Use  . Smoking status: Never Smoker  . Smokeless tobacco: Never Used  Vaping Use  . Vaping Use:  Never used  Substance Use Topics  . Alcohol use: Yes    Alcohol/week: 0.0 standard drinks    Comment: occ  . Drug use: No    Home Medications Prior to Admission medications   Medication Sig Start Date End Date Taking? Authorizing Provider  Acetaminophen (TYLENOL ARTHRITIS PAIN PO) Take 2 tablets by mouth 2 (two) times daily.    [provider]  amLODipine (NORVASC) 5 MG tablet TAKE 1 TABLET BY MOUTH DAILY. 04/03/17   Lucretia Kern, DO  baclofen (LIORESAL) 10 MG tablet TK 1 T PO TID 08/01/15   [provider]  blood glucose meter kit and supplies KIT Use to test blood sugar once a day 09/17/15   Lucretia Kern, DO  dicyclomine (BENTYL) 10 MG capsule Take 1 tab at bedtime.or as directed 12/12/15   Panosh, Standley Brooking, MD  escitalopram (LEXAPRO)  5 MG tablet Take 1 tablet (5 mg total) by mouth daily. 02/12/17   Lucretia Kern, DO  Fenofibrate 40 MG TABS Take 1 tablet by mouth at bedtime. 09/22/19   [provider]  glimepiride (AMARYL) 4 MG tablet Take 1 tablet (4 mg total) by mouth daily before breakfast. 02/12/17   Lucretia Kern, DO  glucose blood (TRUE METRIX BLOOD GLUCOSE TEST) test strip USE UTD 09/09/17   [provider]  Lactase (DAIRY DIGESTIVE PO) Take by mouth.    [provider]  levocetirizine (XYZAL) 5 MG tablet Take 5 mg by mouth every evening.    [provider]  loperamide (IMODIUM) 2 MG capsule Take by mouth as needed for diarrhea or loose stools.    [provider]  losartan-hydrochlorothiazide (HYZAAR) 100-25 MG tablet TAKE 1 TABLET BY MOUTH EVERY DAY 04/03/17   Colin Benton R, DO  MELATONIN PO Take by mouth.    [provider]  meloxicam (MOBIC) 15 MG tablet Take 1 tablet (15 mg total) by mouth daily. 07/28/18   Edrick Kins, DPM  montelukast (SINGULAIR) 10 MG tablet TAKE 1 TABLET BY MOUTH EVERY DAY 04/12/18   Lucretia Kern, DO  omeprazole-sodium bicarbonate (ZEGERID) 40-1100 MG per capsule Take 1 capsule by mouth daily. 03/16/15   Lucretia Kern, DO  pantoprazole (PROTONIX) 40 MG tablet Take 40 mg by mouth daily. 10/03/19   [provider]  Probiotic Product (PROBIOTIC PO) Take by mouth daily.    [provider]  Psyllium (DAILY FIBER PO) Take by mouth.    [provider]  sitaGLIPtin (JANUVIA) 50 MG tablet Take 50 mg by mouth daily.    [provider]  trolamine salicylate (ASPERCREME) 10 % cream Apply 1 application topically as needed for muscle pain.    [provider]  VASCEPA 1 g capsule Take 2 g by mouth 2 (two) times daily. 10/24/19   [provider]  Vitamin D, Ergocalciferol, (DRISDOL) 1.25 MG (50000 UNIT) CAPS capsule Take 50,000 Units by mouth once a week. 09/21/19   [provider]    Allergies      Metformin and related, Onglyza [saxagliptin], and Penicillins  Review of Systems   Review of Systems  Constitutional: Negative for chills, diaphoresis, fatigue and fever.  HENT: Negative for congestion.   Eyes: Positive for visual disturbance (transient flashes in vision). Negative for blurred vision and double vision.  Respiratory: Negative for cough, chest tightness, shortness of breath, wheezing and stridor.   Cardiovascular: Negative for chest pain, palpitations and leg swelling.  Gastrointestinal: Negative for abdominal pain,  constipation, diarrhea, nausea and vomiting.  Genitourinary: Negative for flank pain and frequency.  Musculoskeletal: Positive for neck pain. Negative for back pain and neck stiffness.  Skin: Negative for rash and wound.  Neurological: Positive for tremors (transient). Negative for dizziness, focal weakness, seizures, loss of consciousness, facial asymmetry, speech difficulty, weakness, light-headedness, numbness and headaches.  Psychiatric/Behavioral: Negative for agitation, confusion and memory loss.  All other systems reviewed and are negative.   Physical Exam Updated Vital Signs BP 124/81 (BP Location: Left Arm)   Pulse 70   Temp 98.3 F (36.8 C) (Oral)   Resp 16   Ht _0  (1.6 m)   Wt 83 kg   LMP 08/10/2014   SpO2 100%   BMI 32.42 kg/m   Physical Exam Vitals and nursing note reviewed.  Constitutional:      General: She is not in acute distress.    Appearance: She is well-developed. She is not ill-appearing, toxic-appearing or diaphoretic.  HENT:     Head: Normocephalic and atraumatic.     Nose: Nose normal. No congestion or rhinorrhea.     Mouth/Throat:     Mouth: Mucous membranes are moist.     Pharynx: No oropharyngeal exudate or posterior oropharyngeal erythema.  Eyes:     General: No visual field deficit.    Extraocular Movements: Extraocular movements intact.     Conjunctiva/sclera: Conjunctivae normal.     Pupils: Pupils are  equal, round, and reactive to light.  Cardiovascular:     Rate and Rhythm: Normal rate and regular rhythm.     Pulses: Normal pulses.     Heart sounds: No murmur heard.   Pulmonary:     Effort: Pulmonary effort is normal. No respiratory distress.     Breath sounds: Normal breath sounds. No wheezing, rhonchi or rales.  Chest:     Chest wall: No tenderness.  Abdominal:     General: Abdomen is flat.     Palpations: Abdomen is soft.     Tenderness: There is no abdominal tenderness. There is no right CVA tenderness, left CVA tenderness, guarding or rebound.  Musculoskeletal:        General: No signs of injury.     Cervical back: Neck supple. Tenderness present.  Skin:    General: Skin is warm and dry.     Capillary Refill: Capillary refill takes less than 2 seconds.     Findings: No erythema or lesion.  Neurological:     General: No focal deficit present.     Mental Status: She is alert.     GCS: GCS eye subscore is 4. GCS verbal subscore is 5. GCS motor subscore is 6.     Cranial Nerves: No cranial nerve deficit, dysarthria or facial asymmetry.     Sensory: No sensory deficit.     Motor: No weakness, tremor, abnormal muscle tone or seizure activity.     Coordination: Coordination normal. Finger-Nose-Finger Test normal.     Comments: No focal neurologic deficits on my exam.  Normal sensation and strength in all extremities.  Normal finger-nose-finger testing.  Psychiatric:        Mood and Affect: Mood normal.     ED Results / Procedures / Treatments   Labs (all labs ordered are listed, but only abnormal results are displayed) Labs Reviewed - No data to display  EKG None  Radiology CT Head Wo Contrast  Result Date: 04/11/2020 CLINICAL DATA:  Head trauma, headache, injury EXAM: CT HEAD WITHOUT CONTRAST CT CERVICAL  SPINE WITHOUT CONTRAST TECHNIQUE: Multidetector CT imaging of the head and cervical spine was performed following the standard protocol without intravenous contrast.  Multiplanar CT image reconstructions of the cervical spine were also generated. COMPARISON:  None. FINDINGS: CT HEAD FINDINGS Brain: No evidence of acute infarction, hemorrhage, hydrocephalus, extra-axial collection or mass lesion/mass effect. Vascular: No hyperdense vessel or unexpected calcification. Skull: Normal. Negative for fracture or focal lesion. Sinuses/Orbits: No acute finding. Other: None. CT CERVICAL SPINE FINDINGS Alignment: Normal alignment. No subluxation or dislocation. Facets align. Minor facet arthropathy at multiple levels. Skull base and vertebrae: No acute fracture. No primary bone lesion or focal pathologic process. Soft tissues and spinal canal: No prevertebral fluid or swelling. No visible canal hematoma. Disc levels: Multilevel degenerative disc disease spanning C3-C7. Disc space narrowing noted with sclerosis and endplate osteophytes. Multilevel vacuum disc phenomena. Upper chest: Negative. Other: None. IMPRESSION: Normal head CT without contrast for age. No acute intracranial abnormality. Diffuse cervical degenerative disc disease without acute osseous finding or malalignment by CT. Electronically Signed   By: Jerilynn Mages.  Shick M.D.   On: 04/11/2020 15:32   CT Cervical Spine Wo Contrast  Result Date: 04/11/2020 CLINICAL DATA:  Head trauma, headache, injury EXAM: CT HEAD WITHOUT CONTRAST CT CERVICAL SPINE WITHOUT CONTRAST TECHNIQUE: Multidetector CT imaging of the head and cervical spine was performed following the standard protocol without intravenous contrast. Multiplanar CT image reconstructions of the cervical spine were also generated. COMPARISON:  None. FINDINGS: CT HEAD FINDINGS Brain: No evidence of acute infarction, hemorrhage, hydrocephalus, extra-axial collection or mass lesion/mass effect. Vascular: No hyperdense vessel or unexpected calcification. Skull: Normal. Negative for fracture or focal lesion. Sinuses/Orbits: No acute finding. Other: None. CT CERVICAL SPINE FINDINGS  Alignment: Normal alignment. No subluxation or dislocation. Facets align. Minor facet arthropathy at multiple levels. Skull base and vertebrae: No acute fracture. No primary bone lesion or focal pathologic process. Soft tissues and spinal canal: No prevertebral fluid or swelling. No visible canal hematoma. Disc levels: Multilevel degenerative disc disease spanning C3-C7. Disc space narrowing noted with sclerosis and endplate osteophytes. Multilevel vacuum disc phenomena. Upper chest: Negative. Other: None. IMPRESSION: Normal head CT without contrast for age. No acute intracranial abnormality. Diffuse cervical degenerative disc disease without acute osseous finding or malalignment by CT. Electronically Signed   By: Jerilynn Mages.  Shick M.D.   On: 04/11/2020 15:32    Procedures Procedures (including critical care time)  Medications Ordered in ED Medications - No data to display  ED Course  I have reviewed the triage vital signs and the nursing notes.  Pertinent labs & imaging results that were available during my care of the patient were reviewed by me and considered in my medical decision making (see chart for details).    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  She reports that she was cleaning out her garage when a 2 x 4 slipped from a shelf and hit her in the right parietal/occipital area.  She reports no loss of consciousness and had minimal headache at the time but over the last few days have been developing headaches on and off.  She reports some transient flashes in her vision that are not persistent.  She reports no speech difficulties.  She reports he is  feeling muscle spasms in her neck and arms including some occasional twitches in her hands bilaterally.  She denies persistent numbness or weakness.  She denies any chest pain, shortness of breath, back  pain, abdominal pain, or lower extremity symptoms.  No loss of bowel or bladder control.  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  She reports the pain is worsened at times when she is lying on the area that got hit with a board.  She denies significant pain on my first evaluation.  On exam, patient has clear speech.  No tenderness present in her head but she did have some muscle spasms and tenderness on her right lateral neck.  No midline neck tenderness.  Lungs clear and chest nontender.  Abdomen nontender.  No focal neurologic deficits on my exam including sensation and strength in extremities.  Normal finger-nose-finger testing.  Symmetric smile.  Normal pupil exam with normal extraocular movements.  Patient overall well-appearing.  Patient had CT imaging of her head and neck which did not show any acute abnormality such as fracture or dislocation/subluxation.  There was no bleeding.  We discussed that MRI is better to look for cord abnormalities and strokes however given her waxing waning symptoms that have resolved at this time, we do not feel she needs MRI currently.  Clinically I suspect that patient has a mild concussion from the injury that is triggering atypical headaches that come and go when she lays on the area got hit.  We discussed management and agreed to have her follow-up with neurology as she is already had 9 days of stability proven.  We also will give her a prescription for Fioricet which she reports has worked well for headaches in the past for her.  She understands to return if any symptoms change or worsen or she develops any new or worsened strokelike symptoms.  We have low suspicion for central cord injury at this time and patient is well-appearing.  Patient otherwise or concerns and was discharged in good condition with improved symptoms and with a clear plan for follow-up pending symptom management.   Final Clinical Impression(s) / ED  Diagnoses Final diagnoses:  Injury of head, initial encounter  Concussion without loss of consciousness, initial encounter  Nonintractable episodic headache, unspecified headache type    Rx / DC Orders ED Discharge Orders         Ordered    butalbital-acetaminophen-caffeine (FIORICET) 50-325-40 MG tablet  Every 8 hours PRN     Discontinue  Reprint     04/11/20 1600         Clinical Impression: 1. Injury of head, initial encounter   2. Concussion without loss of consciousness, initial encounter   3. Nonintractable episodic headache, unspecified headache type     Disposition: Discharge  Condition: Good  I have discussed the results, Dx and Tx plan with the pt(& family if present). He/she/they expressed understanding and agree(s) with the plan. Discharge instructions discussed at great length. Strict return precautions discussed and pt &/or family have verbalized understanding of the instructions. No further questions at time of discharge.    New Prescriptions   BUTALBITAL-ACETAMINOPHEN-CAFFEINE (FIORICET) 50-325-40 MG TABLET    Take 1-2 tablets by mouth every 8 (eight) hours as needed for headache.    Follow Up: [place] [id number] [place] [address] [id number] mc  [place]       Talasia Saulter, Gwenyth Allegra, MD 04/11/20 [phone]

## 2020-04-11 NOTE — Discharge Instructions (Signed)
Your history and exam today are consistent with a mild concussion related to the head injury you sustained over a week ago but is likely triggering atypical type migraines causing your episodic symptoms.  Please rest and stay hydrated and use the headache medicine you have had success with in the past.  Please follow-up with outpatient neurology for symptoms persist.  If anything changes or worsens or you develop new neurologic deficits, please return to the nearest emergency department for evaluation.

## 2020-04-11 NOTE — ED Triage Notes (Addendum)
Pt c/o "head injury"- 9 days ago a 2x4 fell and struck right parietal-no break in skin-no LOC-c/o vision changes started yesterday- "feeling like pins and lightening" to bialt UE x 4 days-c/o pain to right parietal and left side of neck-NAD-steady gait

## 2020-05-08 ENCOUNTER — Ambulatory Visit: Payer: BC Managed Care – PPO | Admitting: Diagnostic Neuroimaging

## 2020-05-08 ENCOUNTER — Encounter: Payer: Self-pay | Admitting: Diagnostic Neuroimaging

## 2020-05-08 VITALS — BP 122/74 | HR 72 | Ht 63.0 in | Wt 184.0 lb

## 2020-05-08 DIAGNOSIS — F0781 Postconcussional syndrome: Secondary | ICD-10-CM | POA: Diagnosis not present

## 2020-05-08 NOTE — Progress Notes (Signed)
[facility]  PATIENT: [name] DOB: 01/15/1966  REFERRING CLINICIAN: Sandi Mariscal, MD HISTORY FROM: patient  REASON FOR VISIT: new consult    HISTORICAL  CHIEF COMPLAINT:  Chief Complaint  Patient presents with  . Head Injury    rm 7 ED referral "hit in head by board, concussion, no LOC"; Fioricet has helped; it was like an electrical shock comes thru forearms/hands, I lose grip; shocks are less intense now; happens alot when I am tired now"     HISTORY OF PRESENT ILLNESS:   54 year old female here for evaluation of posttraumatic headaches and posttraumatic pain arms.  End of June 2021 patient was cleaning out her garage when a 2 x 4 fell and hit her on the right side of the head.  She had some pain at the time of impact but otherwise was okay.  Over the next few days she had intermittent shocklike sensations down her arms and hands.  She also developed some headache on the left posterior aspect of her head.  Patient went to the hospital on 04/15/2020 for evaluation.  CT of the head and neck were unremarkable.  Since that time symptoms have improved.  Headaches have resolved.  She has some mild shooting pains in her arms that are random and sporadic.   REVIEW OF SYSTEMS: Full 14 system review of systems performed and negative with exception of: as per HPI.Marland Kitchen  ALLERGIES: Allergies  Allergen Reactions  . Metformin And Related Nausea Only    nausea  . Onglyza [Saxagliptin] Nausea Only  . Penicillins     Severe reaction - hospitlaized    HOME MEDICATIONS: Outpatient Medications Prior to Visit  Medication Sig Dispense Refill  . Acetaminophen (TYLENOL ARTHRITIS PAIN PO) Take 2 tablets by mouth 2 (two) times daily.    Marland Kitchen amLODipine (NORVASC) 5 MG tablet TAKE 1 TABLET BY MOUTH DAILY. 90 tablet 0  . baclofen (LIORESAL) 10 MG tablet TK 1 T PO TID  0  . blood glucose meter kit and supplies KIT Use to test blood sugar once a day 1 each 0  .  butalbital-acetaminophen-caffeine (FIORICET) 50-325-40 MG tablet Take 1-2 tablets by mouth every 8 (eight) hours as needed for headache. 12 tablet 0  . dicyclomine (BENTYL) 10 MG capsule Take 1 tab at bedtime.or as directed 30 capsule 0  . escitalopram (LEXAPRO) 5 MG tablet Take 1 tablet (5 mg total) by mouth daily. 90 tablet 0  . glimepiride (AMARYL) 4 MG tablet Take 1 tablet (4 mg total) by mouth daily before breakfast. 90 tablet 0  . glucose blood (TRUE METRIX BLOOD GLUCOSE TEST) test strip USE UTD    . Lactase (DAIRY DIGESTIVE PO) Take by mouth.    . levocetirizine (XYZAL) 5 MG tablet Take 5 mg by mouth every evening.    . loperamide (IMODIUM) 2 MG capsule Take by mouth as needed for diarrhea or loose stools.    Marland Kitchen losartan-hydrochlorothiazide (HYZAAR) 100-25 MG tablet TAKE 1 TABLET BY MOUTH EVERY DAY 90 tablet 0  . MELATONIN PO Take by mouth.    . meloxicam (MOBIC) 15 MG tablet Take 1 tablet (15 mg total) by mouth daily. 1 tablet 0  . montelukast (SINGULAIR) 10 MG tablet TAKE 1 TABLET BY MOUTH EVERY DAY 30 tablet 0  . omeprazole-sodium bicarbonate (ZEGERID) 40-1100 MG per capsule Take 1 capsule by mouth daily. 90 capsule 3  . pantoprazole (PROTONIX) 40 MG tablet Take 40 mg by mouth daily.    Marland Kitchen  Probiotic Product (PROBIOTIC PO) Take by mouth daily.    . Psyllium (DAILY FIBER PO) Take by mouth.    . sitaGLIPtin (JANUVIA) 50 MG tablet Take 50 mg by mouth daily.    Marland Kitchen trolamine salicylate (ASPERCREME) 10 % cream Apply 1 application topically as needed for muscle pain.    Marland Kitchen VASCEPA 1 g capsule Take 2 g by mouth 2 (two) times daily.    . Vitamin D, Ergocalciferol, (DRISDOL) 1.25 MG (50000 UNIT) CAPS capsule Take 50,000 Units by mouth once a week.    . Fenofibrate 40 MG TABS Take 1 tablet by mouth at bedtime. (Patient not taking: Reported on 05/08/2020)     No facility-administered medications prior to visit.    PAST MEDICAL HISTORY: Past Medical History:  Diagnosis Date  . ALLERGIC RHINITIS  10/08/2007  . Arthritis   . ASTHMA 04/22/2007  . Diabetes mellitus without complication ([place])   . GERD 04/22/2007  . Headache(784.0) 10/08/2007  . HYPERLIPIDEMIA 04/22/2007  . HYPERTENSION 04/22/2007  . IBS (irritable bowel syndrome)    constipation predominant  . LOW BACK PAIN 06/27/2008  . LUQ PAIN 09/06/2010  . NAUSEA 09/06/2010  . PONV (postoperative nausea and vomiting)     PAST SURGICAL HISTORY: Past Surgical History:  Procedure Laterality Date  . CESAREAN SECTION    . KNEE ARTHROPLASTY Right 04/29/2013   Procedure: COMPUTER ASSISTED TOTAL KNEE ARTHROPLASTY;  Surgeon: Marybelle Killings, MD;  Location: [place];  Service: Orthopedics;  Laterality: Right;  Right Total Knee Arthroplasty, Cemented  . KNEE SURGERY  92, 96, 01, 08   right  . KNEE SURGERY  88   left  . LUMBAR FUSION  05   L4-5 S1  . NASAL POLYP SURGERY  95   sinus  . TUBAL LIGATION      FAMILY HISTORY: Family History  Problem Relation Age of Onset  . Diabetes Father   . Heart disease Father        CAD  . Hyperlipidemia Father   . Hypertension Father   . Pulmonary fibrosis Father   . Hypertension Mother   . Arthritis Mother        OA s/p bilateral hip replacement  . Osteopenia Sister        older with GI problems s/p colectomy  . Cancer Maternal Grandmother        Ovarian Cancer  . Osteopenia Sister     SOCIAL HISTORY: Social History   Socioeconomic History  . Marital status: Married    Spouse name: Not on file  . Number of children: 2  . Years of education: 9  . Highest education level: Not on file  Occupational History  . Occupation: substitute Product manager: [employer]  Tobacco Use  . Smoking status: Never Smoker  . Smokeless tobacco: Never Used  Vaping Use  . Vaping Use: Never used  Substance and Sexual Activity  . Alcohol use: Yes    Alcohol/week: 0.0 standard drinks    Comment: occ alcohol  . Drug use: No  . Sexual activity: Not on file  Other Topics Concern  . Not on  file  Social History Narrative   Updated 08/09/2015   Work or School: Now retired as of 07/2015; prior work: Hydrologist, part Theatre manager      Home Situation: son with special needs, sick a lot also 84 yo daughter      Spiritual Beliefs: christian      Lifestyle: no regular exercise,  diet in so so      Social Determinants of Health   Financial Resource Strain:   . Difficulty of Paying Living Expenses:   Food Insecurity:   . Worried About Charity fundraiser in the Last Year:   . Arboriculturist in the Last Year:   Transportation Needs:   . Film/video editor (Medical):   Marland Kitchen Lack of Transportation (Non-Medical):   Physical Activity:   . Days of Exercise per Week:   . Minutes of Exercise per Session:   Stress:   . Feeling of Stress :   Social Connections:   . Frequency of Communication with Friends and Family:   . Frequency of Social Gatherings with Friends and Family:   . Attends Religious Services:   . Active Member of Clubs or Organizations:   . Attends Archivist Meetings:   Marland Kitchen Marital Status:   Intimate Partner Violence:   . Fear of Current or Ex-Partner:   . Emotionally Abused:   Marland Kitchen Physically Abused:   . Sexually Abused:      PHYSICAL EXAM  GENERAL EXAM/CONSTITUTIONAL: Vitals:  Vitals:   05/08/20 1538  BP: 122/74  Pulse: 72  Weight: 184 lb (83.5 kg)  Height: '5\' 3"'  (1.6 m)     Body mass index is 32.59 kg/m. Wt Readings from Last 3 Encounters:  05/08/20 184 lb (83.5 kg)  04/11/20 183 lb (83 kg)  12/03/18 180 lb (81.6 kg)     Patient is in no distress; well developed, nourished and groomed; neck is supple  CARDIOVASCULAR:  Examination of carotid arteries is normal; no carotid bruits  Regular rate and rhythm, no murmurs  Examination of peripheral vascular system by observation and palpation is normal  EYES:  Ophthalmoscopic exam of optic discs and posterior segments is normal; no papilledema or hemorrhages  No exam data  present  MUSCULOSKELETAL:  Gait, strength, tone, movements noted in Neurologic exam below  NEUROLOGIC: MENTAL STATUS:  No flowsheet data found.  awake, alert, oriented to person, place and time  recent and remote memory intact  normal attention and concentration  language fluent, comprehension intact, naming intact  fund of knowledge appropriate  CRANIAL NERVE:   2nd - no papilledema on fundoscopic exam  2nd, 3rd, 4th, 6th - pupils equal and reactive to light, visual fields full to confrontation, extraocular muscles intact, no nystagmus  5th - facial sensation symmetric  7th - facial strength symmetric  8th - hearing intact  9th - palate elevates symmetrically, uvula midline  11th - shoulder shrug symmetric  12th - tongue protrusion midline  MOTOR:   normal bulk and tone, full strength in the BUE, BLE  SENSORY:   normal and symmetric to light touch, temperature, vibration  COORDINATION:   finger-nose-finger, fine finger movements normal  REFLEXES:   deep tendon reflexes present and symmetric  GAIT/STATION:   narrow based gait     DIAGNOSTIC DATA (LABS, IMAGING, TESTING) - I reviewed patient records, labs, notes, testing and imaging myself where available.  Lab Results  Component Value Date   WBC 8.6 08/09/2015   HGB 15.9 (H) 08/09/2015   HCT 48.2 (H) 08/09/2015   MCV 92.6 08/09/2015   PLT 220.0 08/09/2015      Component Value Date/Time   NA 141 09/15/2016 0833   K 3.6 09/15/2016 0833   CL 104 09/15/2016 0833   CO2 29 09/15/2016 0833   GLUCOSE 150 (H) 09/15/2016 0833   BUN 19 09/15/2016 3009  CREATININE 0.81 09/15/2016 0833   CALCIUM 9.7 09/15/2016 0833   PROT 6.7 04/27/2013 1049   ALBUMIN 3.9 04/27/2013 1049   AST 31 04/27/2013 1049   ALT 28 04/27/2013 1049   ALKPHOS 62 04/27/2013 1049   BILITOT 0.2 (L) 04/27/2013 1049   GFRNONAA >90 04/30/2013 0410   GFRAA >90 04/30/2013 0410   Lab Results  Component Value Date   CHOL 173  09/15/2016   HDL 48.00 09/15/2016   LDLCALC 100 (H) 09/15/2016   LDLDIRECT 125.5 10/04/2013   TRIG 124.0 09/15/2016   CHOLHDL 4 09/15/2016   Lab Results  Component Value Date   HGBA1C 7.2 (H) 09/15/2016   No results found for: [id number] Lab Results  Component Value Date   TSH 1.86 08/30/2010    04/11/20 CT head / cervical: Normal head CT without contrast for age. No acute intracranial abnormality.  Diffuse cervical degenerative disc disease without acute osseous finding or malalignment by CT.    ASSESSMENT AND PLAN  54 y.o. year old female here with posttraumatic headache and postconcussion syndrome.  May have had some cervical spine strain and nerve root irritation.  Symptoms are slowly improving over time.  Dx:  1. Post concussion syndrome     PLAN:  POST CONCUSSION SYNDROME (head, neck, arms) - monitor; continue gentle stretching exercises and current pain medications (Tylenol and baclofen) which patient uses for low back pain and arthritis.  Return for pending if symptoms worsen or fail to improve, return to PCP.    Penni Bombard, MD 03/09/8388, 3:06 PM Certified in Neurology, Neurophysiology and Neuroimaging  [facility] [address], [place], [place] [address] [phone]

## 2020-06-12 ENCOUNTER — Emergency Department (HOSPITAL_COMMUNITY)
Admission: EM | Admit: 2020-06-12 | Discharge: 2020-06-12 | Disposition: A | Discharge status: Home / Self Care | Payer: BC Managed Care – PPO | Attending: Emergency Medicine | Admitting: Emergency Medicine

## 2020-06-12 ENCOUNTER — Encounter (HOSPITAL_COMMUNITY): Payer: Self-pay | Admitting: Emergency Medicine

## 2020-06-12 DIAGNOSIS — Z79899 Other long term (current) drug therapy: Secondary | ICD-10-CM | POA: Diagnosis not present

## 2020-06-12 DIAGNOSIS — J45909 Unspecified asthma, uncomplicated: Secondary | ICD-10-CM | POA: Diagnosis not present

## 2020-06-12 DIAGNOSIS — E119 Type 2 diabetes mellitus without complications: Secondary | ICD-10-CM | POA: Diagnosis not present

## 2020-06-12 DIAGNOSIS — I10 Essential (primary) hypertension: Secondary | ICD-10-CM | POA: Diagnosis not present

## 2020-06-12 DIAGNOSIS — U071 COVID-19: Secondary | ICD-10-CM | POA: Diagnosis present

## 2020-06-12 DIAGNOSIS — R112 Nausea with vomiting, unspecified: Secondary | ICD-10-CM

## 2020-06-12 LAB — CBC WITH DIFFERENTIAL/PLATELET
Abs Immature Granulocytes: 0.02 10*3/uL (ref 0.00–0.07)
Basophils Absolute: 0 10*3/uL (ref 0.0–0.1)
Basophils Relative: 0 %
Eosinophils Absolute: 0 10*3/uL (ref 0.0–0.5)
Eosinophils Relative: 0 %
HCT: 39.9 % (ref 36.0–46.0)
Hemoglobin: 13.6 g/dL (ref 12.0–15.0)
Immature Granulocytes: 0 %
Lymphocytes Relative: 13 %
Lymphs Abs: 0.6 10*3/uL — ABNORMAL LOW (ref 0.7–4.0)
MCH: 30.2 pg (ref 26.0–34.0)
MCHC: 34.1 g/dL (ref 30.0–36.0)
MCV: 88.5 fL (ref 80.0–100.0)
Monocytes Absolute: 0.2 10*3/uL (ref 0.1–1.0)
Monocytes Relative: 5 %
Neutro Abs: 3.7 10*3/uL (ref 1.7–7.7)
Neutrophils Relative %: 82 %
Platelets: 154 10*3/uL (ref 150–400)
RBC: 4.51 MIL/uL (ref 3.87–5.11)
RDW: 13.5 % (ref 11.5–15.5)
WBC: 4.6 10*3/uL (ref 4.0–10.5)
nRBC: 0 % (ref 0.0–0.2)

## 2020-06-12 LAB — BASIC METABOLIC PANEL
Anion gap: 11 (ref 5–15)
BUN: 13 mg/dL (ref 6–20)
CO2: 24 mmol/L (ref 22–32)
Calcium: 8.6 mg/dL — ABNORMAL LOW (ref 8.9–10.3)
Chloride: 102 mmol/L (ref 98–111)
Creatinine, Ser: 0.7 mg/dL (ref 0.44–1.00)
GFR calc Af Amer: >60 mL/min (ref >60)
GFR calc non Af Amer: >60 mL/min (ref >60)
Glucose, Bld: 177 mg/dL — ABNORMAL HIGH (ref 70–99)
Potassium: 3.2 mmol/L — ABNORMAL LOW (ref 3.5–5.1)
Sodium: 137 mmol/L (ref 135–145)

## 2020-06-12 LAB — HEPATIC FUNCTION PANEL
ALT: 29 U/L (ref 0–44)
AST: 26 U/L (ref 15–41)
Albumin: 3.7 g/dL (ref 3.5–5.0)
Alkaline Phosphatase: 47 U/L (ref 38–126)
Bilirubin, Direct: 0.1 mg/dL (ref 0.0–0.2)
Indirect Bilirubin: 0.3 mg/dL (ref 0.3–0.9)
Total Bilirubin: 0.4 mg/dL (ref 0.3–1.2)
Total Protein: 6.6 g/dL (ref 6.5–8.1)

## 2020-06-12 MED ORDER — ACETAMINOPHEN 325 MG PO TABS
650.0000 mg | ORAL_TABLET | Freq: Once | ORAL | Status: AC | PRN
  Administered 2020-06-12: 650 mg via ORAL
  Filled 2020-06-12 (×2): qty 2

## 2020-06-12 MED ORDER — ALBUTEROL SULFATE HFA 108 (90 BASE) MCG/ACT IN AERS: 2.0000 | INHALATION_SPRAY | Freq: Once | RESPIRATORY_TRACT | Status: DC | PRN

## 2020-06-12 MED ORDER — FAMOTIDINE IN NACL 20-0.9 MG/50ML-% IV SOLN: 20.0000 mg | Freq: Once | INTRAVENOUS | Status: DC | PRN

## 2020-06-12 MED ORDER — EPINEPHRINE 0.3 MG/0.3ML IJ SOAJ: 0.3000 mg | Freq: Once | INTRAMUSCULAR | Status: DC | PRN

## 2020-06-12 MED ORDER — SODIUM CHLORIDE 0.9 % IV SOLN: INTRAVENOUS | Status: DC | PRN

## 2020-06-12 MED ORDER — SODIUM CHLORIDE 0.9 % IV SOLN
1200.0000 mg | Freq: Once | INTRAVENOUS | Status: AC
  Administered 2020-06-12: 1200 mg via INTRAVENOUS
  Filled 2020-06-12: qty 1200

## 2020-06-12 MED ORDER — METHYLPREDNISOLONE SODIUM SUCC 125 MG IJ SOLR: 125.0000 mg | Freq: Once | INTRAMUSCULAR | Status: DC | PRN

## 2020-06-12 MED ORDER — ONDANSETRON HCL 4 MG/2ML IJ SOLN
4.0000 mg | Freq: Once | INTRAMUSCULAR | Status: AC
  Administered 2020-06-12: 4 mg via INTRAVENOUS
  Filled 2020-06-12: qty 2

## 2020-06-12 MED ORDER — DIPHENHYDRAMINE HCL 50 MG/ML IJ SOLN: 50.0000 mg | Freq: Once | INTRAMUSCULAR | Status: DC | PRN

## 2020-06-12 MED ORDER — SODIUM CHLORIDE 0.9 % IV BOLUS
1000.0000 mL | Freq: Once | INTRAVENOUS | Status: AC
  Administered 2020-06-12: 1000 mL via INTRAVENOUS

## 2020-06-12 NOTE — ED Triage Notes (Addendum)
Per EMS-tested positive last Tuesday-having nausea, vomiting and diarrhea-patient refused BP stating "it hurt"

## 2020-06-12 NOTE — ED Provider Notes (Signed)
[facility] DEPT Provider Note   CSN: [id number] Arrival date & time: 06/12/20  4098     History Chief Complaint  Patient presents with  . Covid Positive    [name] is a 54 y.o. female.  HPI She has been ill about 10 days with fever, chills, achiness, nausea, vomiting, diarrhea, cough and shortness of breath.  She was diagnosed with COVID-19 infection on 06/05/2020.  She has not been able to tolerate much food or fluid.  No blood in emesis or diarrhea.  She feels generally weak.  She presents for evaluation treatment by EMS.  She has not had any specific Covid treatments, yet.  There are no other known modifying factors.    Past Medical History:  Diagnosis Date  . ALLERGIC RHINITIS 10/08/2007  . Arthritis   . ASTHMA 04/22/2007  . Diabetes mellitus without complication ([place])   . GERD 04/22/2007  . Headache(784.0) 10/08/2007  . HYPERLIPIDEMIA 04/22/2007  . HYPERTENSION 04/22/2007  . IBS (irritable bowel syndrome)    constipation predominant  . LOW BACK PAIN 06/27/2008  . LUQ PAIN 09/06/2010  . NAUSEA 09/06/2010  . PONV (postoperative nausea and vomiting)     Patient Active Problem List   Diagnosis Date Noted  . Referred otalgia of both ears 01/11/2018  . Temporomandibular joint (TMJ) pain 01/11/2018  . Lateral epicondylitis of left elbow 11/18/2016  . Left wrist pain 11/18/2016  . Fibromyalgia 09/14/2016  . IBS (irritable bowel syndrome) 09/14/2016  . Obesity 09/14/2016  . Anxiety state 08/01/2013  . Osteoarthritis of right knee 04/29/2013    Class: Diagnosis of  . Allergic rhinitis 10/08/2007  . Headache(784.0) 10/08/2007  . Diabetes mellitus ([place]) 10/08/2007  . Hyperlipemia 04/22/2007  . Essential hypertension 04/22/2007  . Asthma 04/22/2007  . GERD 04/22/2007    Past Surgical History:  Procedure Laterality Date  . CESAREAN SECTION    . KNEE ARTHROPLASTY Right 04/29/2013   Procedure: COMPUTER ASSISTED TOTAL KNEE ARTHROPLASTY;   Surgeon: Marybelle Killings, MD;  Location: [facility];  Service: Orthopedics;  Laterality: Right;  Right Total Knee Arthroplasty, Cemented  . KNEE SURGERY  92, 96, 01, 08   right  . KNEE SURGERY  88   left  . LUMBAR FUSION  05   L4-5 S1  . NASAL POLYP SURGERY  95   sinus  . TUBAL LIGATION       OB History   No obstetric history on file.     Family History  Problem Relation Age of Onset  . Diabetes Father   . Heart disease Father        CAD  . Hyperlipidemia Father   . Hypertension Father   . Pulmonary fibrosis Father   . Hypertension Mother   . Arthritis Mother        OA s/p bilateral hip replacement  . Osteopenia Sister        older with GI problems s/p colectomy  . Cancer Maternal Grandmother        Ovarian Cancer  . Osteopenia Sister     Social History   Tobacco Use  . Smoking status: Never Smoker  . Smokeless tobacco: Never Used  Vaping Use  . Vaping Use: Never used  Substance Use Topics  . Alcohol use: Yes    Alcohol/week: 0.0 standard drinks    Comment: occ alcohol  . Drug use: No    Home Medications Prior to Admission medications   Medication Sig Start Date End Date  Taking? Authorizing Provider  Acetaminophen (TYLENOL ARTHRITIS PAIN PO) Take 2 tablets by mouth 2 (two) times daily.    [provider]  amLODipine (NORVASC) 5 MG tablet TAKE 1 TABLET BY MOUTH DAILY. 04/03/17   Lucretia Kern, DO  baclofen (LIORESAL) 10 MG tablet TK 1 T PO TID 08/01/15   [provider]  blood glucose meter kit and supplies KIT Use to test blood sugar once a day 09/17/15   Lucretia Kern, DO  butalbital-acetaminophen-caffeine (FIORICET) [phone] MG tablet Take 1-2 tablets by mouth every 8 (eight) hours as needed for headache. 04/11/20 04/11/21  Tegeler, Gwenyth Allegra, MD  dicyclomine (BENTYL) 10 MG capsule Take 1 tab at bedtime.or as directed 12/12/15   Panosh, Standley Brooking, MD  escitalopram (LEXAPRO) 5 MG tablet Take 1 tablet (5 mg total) by mouth daily. 02/12/17   Lucretia Kern, DO  Fenofibrate 40 MG TABS Take 1 tablet by mouth at bedtime. Patient not taking: Reported on 05/08/2020 09/22/19   [provider]  glimepiride (AMARYL) 4 MG tablet Take 1 tablet (4 mg total) by mouth daily before breakfast. 02/12/17   Lucretia Kern, DO  glucose blood (TRUE METRIX BLOOD GLUCOSE TEST) test strip USE UTD 09/09/17   [provider]  Lactase (DAIRY DIGESTIVE PO) Take by mouth.    [provider]  levocetirizine (XYZAL) 5 MG tablet Take 5 mg by mouth every evening.    [provider]  loperamide (IMODIUM) 2 MG capsule Take by mouth as needed for diarrhea or loose stools.    [provider]  losartan-hydrochlorothiazide (HYZAAR) 100-25 MG tablet TAKE 1 TABLET BY MOUTH EVERY DAY 04/03/17   Colin Benton R, DO  MELATONIN PO Take by mouth.    [provider]  meloxicam (MOBIC) 15 MG tablet Take 1 tablet (15 mg total) by mouth daily. 07/28/18   Edrick Kins, DPM  montelukast (SINGULAIR) 10 MG tablet TAKE 1 TABLET BY MOUTH EVERY DAY 04/12/18   Lucretia Kern, DO  omeprazole-sodium bicarbonate (ZEGERID) 40-1100 MG per capsule Take 1 capsule by mouth daily. 03/16/15   Lucretia Kern, DO  pantoprazole (PROTONIX) 40 MG tablet Take 40 mg by mouth daily. 10/03/19   [provider]  Probiotic Product (PROBIOTIC PO) Take by mouth daily.    [provider]  Psyllium (DAILY FIBER PO) Take by mouth.    [provider]  sitaGLIPtin (JANUVIA) 50 MG tablet Take 50 mg by mouth daily.    [provider]  trolamine salicylate (ASPERCREME) 10 % cream Apply 1 application topically as needed for muscle pain.    [provider]  VASCEPA 1 g capsule Take 2 g by mouth 2 (two) times daily. 10/24/19   [provider]  Vitamin D, Ergocalciferol, (DRISDOL) 1.25 MG (50000 UNIT) CAPS capsule Take 50,000 Units by mouth once a week. 09/21/19   [provider]    Allergies    Metformin and related, Onglyza  [saxagliptin], and Penicillins  Review of Systems   Review of Systems  All other systems reviewed and are negative.   Physical Exam Updated Vital Signs BP 128/73   Pulse 65   Temp (!) 102.3 F (39.1 C) (Oral)   Resp 18   LMP 08/10/2014   SpO2 97%   Physical Exam Vitals and nursing note reviewed.  Constitutional:      General: She is in acute distress (Uncomfortable).     Appearance: She is well-developed. She is ill-appearing.  She is not toxic-appearing or diaphoretic.  HENT:     Head: Normocephalic and atraumatic.     Right Ear: External ear normal.     Left Ear: External ear normal.  Eyes:     Conjunctiva/sclera: Conjunctivae normal.     Pupils: Pupils are equal, round, and reactive to light.  Neck:     Trachea: Phonation normal.  Cardiovascular:     Rate and Rhythm: Normal rate and regular rhythm.  Pulmonary:     Effort: Pulmonary effort is normal. No respiratory distress.     Breath sounds: No stridor.  Chest:     Chest wall: No tenderness.  Abdominal:     General: There is no distension.     Palpations: Abdomen is soft. There is no mass.     Tenderness: There is no abdominal tenderness. There is no guarding.     Hernia: No hernia is present.  Musculoskeletal:        General: Normal range of motion.     Cervical back: Normal range of motion and neck supple.  Skin:    General: Skin is warm and dry.  Neurological:     Mental Status: She is alert and oriented to person, place, and time.     Cranial Nerves: No cranial nerve deficit.     Sensory: No sensory deficit.     Motor: No abnormal muscle tone.     Coordination: Coordination normal.  Psychiatric:        Mood and Affect: Mood normal.        Behavior: Behavior normal.        Thought Content: Thought content normal.        Judgment: Judgment normal.     ED Results / Procedures / Treatments   Labs (all labs ordered are listed, but only abnormal results are displayed) Labs Reviewed  CBC WITH  DIFFERENTIAL/PLATELET - Abnormal; Notable for the following components:      Result Value   Lymphs Abs 0.6 (*)    All other components within normal limits  BASIC METABOLIC PANEL - Abnormal; Notable for the following components:   Potassium 3.2 (*)    Glucose, Bld 177 (*)    Calcium 8.6 (*)    All other components within normal limits  HEPATIC FUNCTION PANEL    EKG None  Radiology No results found.  Procedures .Critical Care Performed by: Daleen Bo, MD Authorized by: Daleen Bo, MD   Critical care provider statement:    Critical care time (minutes):  45   Critical care start time:  06/12/2020 9:50 AM   Critical care end time:  06/12/2020 12:50 PM   Critical care time was exclusive of:  Separately billable procedures and treating other patients   Critical care was necessary to treat or prevent imminent or life-threatening deterioration of the following conditions:  Dehydration   Critical care was time spent personally by me on the following activities:  Blood draw for specimens, development of treatment plan with patient or surrogate, discussions with consultants, evaluation of patient's response to treatment, examination of patient, obtaining history from patient or surrogate, ordering and performing treatments and interventions, ordering and review of laboratory studies, pulse oximetry, re-evaluation of patient's condition, review of old charts and ordering and review of radiographic studies   (including critical care time)  Medications Ordered in ED Medications  0.9 %  sodium chloride infusion (has no administration in time range)  diphenhydrAMINE (BENADRYL) injection 50 mg (has no administration in time  range)  famotidine (PEPCID) IVPB 20 mg premix (has no administration in time range)  methylPREDNISolone sodium succinate (SOLU-MEDROL) 125 mg/2 mL injection 125 mg (has no administration in time range)  albuterol (VENTOLIN HFA) 108 (90 Base) MCG/ACT inhaler 2 puff (has  no administration in time range)  EPINEPHrine (EPI-PEN) injection 0.3 mg (has no administration in time range)  acetaminophen (TYLENOL) tablet 650 mg (650 mg Oral Given 06/12/20 1026)  sodium chloride 0.9 % bolus 1,000 mL (0 mLs Intravenous Stopped 06/12/20 1207)  ondansetron (ZOFRAN) injection 4 mg (4 mg Intravenous Given 06/12/20 1026)  casirivimab-imdevimab (REGEN-COV) 1,200 mg in sodium chloride 0.9 % 110 mL IVPB (0 mg Intravenous Stopped 06/12/20 1207)    ED Course  I have reviewed the triage vital signs and the nursing notes.  Pertinent labs & imaging results that were available during my care of the patient were reviewed by me and considered in my medical decision making (see chart for details).  Clinical Course as of Jun 12 1252  Tue Jun 12, 2020  1127 Normal  Hepatic function panel [EW]  1127 Normal except potassium low, glucose high, calcium low  Basic metabolic panel(!) [EW]  5465 Normal  CBC with Differential(!) [EW]    Clinical Course User Index [EW] Daleen Bo, MD   MDM Rules/Calculators/A&P                           Patient Vitals for the past 24 hrs:  BP Temp Temp src Pulse Resp SpO2  06/12/20 1100 128/73 -- -- 65 18 97 %  06/12/20 1030 (!) 144/79 -- -- 68 -- 98 %  06/12/20 1020 (!) 151/86 -- -- 67 18 97 %  06/12/20 1000 (!) 151/86 -- -- 76 -- 98 %  06/12/20 0717 -- (!) 102.3 F (39.1 C) Oral 85 20 96 %    12:50 PM Reevaluation with update and discussion. After initial assessment and treatment, an updated evaluation reveals she reports she is feeling better at this time.  She has not vomited so she came here.  Findings discussed and questions answered. Daleen Bo   Medical Decision Making:  This patient is presenting for evaluation of ongoing symptoms of COVID-19, unable to tolerate full oral intake, which does require a range of treatment options, and is a complaint that involves a high risk of morbidity and mortality. The differential diagnoses include  progressive Covid symptoms, metabolic derangement, secondary infection. I decided to review old records, and in summary patient with diabetes and obesity presenting with complications of Covid infection.  I did not require additional historical information from anyone.  Clinical Laboratory Tests Ordered, included CBC, Metabolic panel and Lipase. Review indicates mild hypokalemia and hyperglycemia.     Critical Interventions-clinical evaluation, laboratory testing, monoclonal antibody infusion, observation reassessment  After These Interventions, the Patient was reevaluated and was found patient felt better after treatment with IV fluids and IV Zofran.  She tolerated medical antibody infusion without distress.  Patient with normal vital signs not requiring hospitalization or additional intervention at this time.  CRITICAL CARE-yes Performed by: Daleen Bo  Nursing Notes Reviewed/ Care Coordinated Applicable Imaging Reviewed Interpretation of Laboratory Data incorporated into ED treatment  The patient appears reasonably screened and/or stabilized for discharge and I doubt any other medical condition or other [facility] requiring further screening, evaluation, or treatment in the ED at this time prior to discharge.  Plan: Home Medications-continue usual medications, Zofran for nausea, Tylenol for fever;  Home Treatments-gradual advance diet elevated bili; return here if the recommended treatment, does not improve the symptoms; Recommended follow up-PCP, as needed     Final Clinical Impression(s) / ED Diagnoses Final diagnoses:  COVID-19 virus infection  Nausea and vomiting, intractability of vomiting not specified, unspecified vomiting type    Rx / DC Orders ED Discharge Orders    None       Daleen Bo, MD 06/12/20 1253

## 2020-06-12 NOTE — Discharge Instructions (Addendum)
Correct advance her diet.  Use Tylenol for fever.  See your doctor as needed for problems.

## 2020-06-15 ENCOUNTER — Emergency Department (HOSPITAL_COMMUNITY)
Admission: EM | Admit: 2020-06-15 | Discharge: 2020-06-15 | Disposition: A | Discharge status: Home / Self Care | Payer: BC Managed Care – PPO | Attending: Emergency Medicine | Admitting: Emergency Medicine

## 2020-06-15 ENCOUNTER — Emergency Department (HOSPITAL_COMMUNITY): Payer: BC Managed Care – PPO

## 2020-06-15 DIAGNOSIS — U071 COVID-19: Secondary | ICD-10-CM

## 2020-06-15 DIAGNOSIS — E119 Type 2 diabetes mellitus without complications: Secondary | ICD-10-CM | POA: Insufficient documentation

## 2020-06-15 DIAGNOSIS — J45909 Unspecified asthma, uncomplicated: Secondary | ICD-10-CM | POA: Diagnosis not present

## 2020-06-15 DIAGNOSIS — B349 Viral infection, unspecified: Secondary | ICD-10-CM

## 2020-06-15 DIAGNOSIS — Z96651 Presence of right artificial knee joint: Secondary | ICD-10-CM | POA: Diagnosis not present

## 2020-06-15 DIAGNOSIS — Z79899 Other long term (current) drug therapy: Secondary | ICD-10-CM | POA: Diagnosis not present

## 2020-06-15 LAB — CBC WITH DIFFERENTIAL/PLATELET
Abs Immature Granulocytes: 0.03 10*3/uL (ref 0.00–0.07)
Basophils Absolute: 0 10*3/uL (ref 0.0–0.1)
Basophils Relative: 0 %
Eosinophils Absolute: 0 10*3/uL (ref 0.0–0.5)
Eosinophils Relative: 0 %
HCT: 43 % (ref 36.0–46.0)
Hemoglobin: 14.9 g/dL (ref 12.0–15.0)
Immature Granulocytes: 1 %
Lymphocytes Relative: 16 %
Lymphs Abs: 0.7 10*3/uL (ref 0.7–4.0)
MCH: 30.2 pg (ref 26.0–34.0)
MCHC: 34.7 g/dL (ref 30.0–36.0)
MCV: 87 fL (ref 80.0–100.0)
Monocytes Absolute: 0.4 10*3/uL (ref 0.1–1.0)
Monocytes Relative: 9 %
Neutro Abs: 3.5 10*3/uL (ref 1.7–7.7)
Neutrophils Relative %: 74 %
Platelets: 279 10*3/uL (ref 150–400)
RBC: 4.94 MIL/uL (ref 3.87–5.11)
RDW: 13.1 % (ref 11.5–15.5)
WBC: 4.7 10*3/uL (ref 4.0–10.5)
nRBC: 0 % (ref 0.0–0.2)

## 2020-06-15 LAB — COMPREHENSIVE METABOLIC PANEL
ALT: 31 U/L (ref 0–44)
AST: 28 U/L (ref 15–41)
Albumin: 4.1 g/dL (ref 3.5–5.0)
Alkaline Phosphatase: 59 U/L (ref 38–126)
Anion gap: 13 (ref 5–15)
BUN: 8 mg/dL (ref 6–20)
CO2: 26 mmol/L (ref 22–32)
Calcium: 9.4 mg/dL (ref 8.9–10.3)
Chloride: 102 mmol/L (ref 98–111)
Creatinine, Ser: 0.63 mg/dL (ref 0.44–1.00)
GFR calc Af Amer: >60 mL/min (ref >60)
GFR calc non Af Amer: >60 mL/min (ref >60)
Glucose, Bld: 140 mg/dL — ABNORMAL HIGH (ref 70–99)
Potassium: 3.2 mmol/L — ABNORMAL LOW (ref 3.5–5.1)
Sodium: 141 mmol/L (ref 135–145)
Total Bilirubin: 0.7 mg/dL (ref 0.3–1.2)
Total Protein: 7.6 g/dL (ref 6.5–8.1)

## 2020-06-15 LAB — LIPASE, BLOOD: Lipase: 26 U/L (ref 11–51)

## 2020-06-15 MED ORDER — METOCLOPRAMIDE HCL 5 MG/ML IJ SOLN
10.0000 mg | Freq: Once | INTRAMUSCULAR | Status: AC
  Administered 2020-06-15: 10 mg via INTRAVENOUS
  Filled 2020-06-15: qty 2

## 2020-06-15 MED ORDER — SODIUM CHLORIDE 0.9 % IV BOLUS
1000.0000 mL | Freq: Once | INTRAVENOUS | Status: AC
  Administered 2020-06-15: 1000 mL via INTRAVENOUS

## 2020-06-15 MED ORDER — DIPHENHYDRAMINE HCL 50 MG/ML IJ SOLN
25.0000 mg | Freq: Once | INTRAMUSCULAR | Status: AC
  Administered 2020-06-15: 25 mg via INTRAVENOUS
  Filled 2020-06-15: qty 1

## 2020-06-15 MED ORDER — ACETAMINOPHEN 325 MG PO TABS
650.0000 mg | ORAL_TABLET | Freq: Once | ORAL | Status: AC
  Administered 2020-06-15: 650 mg via ORAL
  Filled 2020-06-15: qty 2

## 2020-06-15 NOTE — ED Triage Notes (Signed)
Pt arrived with EMS from home c/o weakness. EMS stated pt unable to eat due to nausea and vomiting. Pt Covid + last week.

## 2020-06-15 NOTE — Discharge Instructions (Addendum)
Continue taking your Phenergan as needed.  You can also take ibuprofen and Tylenol as needed. Make sure you are drinking plenty of fluids. Follow-up with your primary care provider. Return to the ER if you start to experience worsening abdominal pain, if you develop chest pain, shortness of breath, uncontrollable vomiting.

## 2020-06-15 NOTE — ED Provider Notes (Signed)
[facility] DEPT Provider Note   CSN: [id number] Arrival date & time: 06/15/20  1120     History Chief Complaint  Patient presents with  . Weakness  . Covid Positive    [name] is a 54 y.o. female= with a past medical history of diabetes, hypertension, hyperlipidemia presenting to the ED with a chief complaint of generalized weakness, fatigue, nausea and vomiting.  Patient began having symptoms on 05/31/2020 was diagnosed with Covid on 06/05/2020.  She was seen and evaluated here in the ED on the seventh and underwent infusion.  She reported some improvement in her symptoms but then started having more fatigue, decreased appetite and vomiting.  She initially took Zofran but then switched to Phenergan.  Last dose was approximately 6 hours ago.  Reports left-sided chest pressure and intermittent cough.  Reports some shortness of breath associated with the cough as well.  Denies any hemoptysis.  Reports left-sided abdominal pain as well.  Does endorse nonbloody diarrhea.  Reports myalgias.  Has a history of asthma and has been needing to use her inhaler as needed.  Denies any leg swelling or supplemental oxygen use at baseline.  HPI     Past Medical History:  Diagnosis Date  . ALLERGIC RHINITIS 10/08/2007  . Arthritis   . ASTHMA 04/22/2007  . Diabetes mellitus without complication ([place])   . GERD 04/22/2007  . Headache(784.0) 10/08/2007  . HYPERLIPIDEMIA 04/22/2007  . HYPERTENSION 04/22/2007  . IBS (irritable bowel syndrome)    constipation predominant  . LOW BACK PAIN 06/27/2008  . LUQ PAIN 09/06/2010  . NAUSEA 09/06/2010  . PONV (postoperative nausea and vomiting)     Patient Active Problem List   Diagnosis Date Noted  . Referred otalgia of both ears 01/11/2018  . Temporomandibular joint (TMJ) pain 01/11/2018  . Lateral epicondylitis of left elbow 11/18/2016  . Left wrist pain 11/18/2016  . Fibromyalgia 09/14/2016  . IBS (irritable bowel syndrome)  09/14/2016  . Obesity 09/14/2016  . Anxiety state 08/01/2013  . Osteoarthritis of right knee 04/29/2013    Class: Diagnosis of  . Allergic rhinitis 10/08/2007  . Headache(784.0) 10/08/2007  . Diabetes mellitus ([place]) 10/08/2007  . Hyperlipemia 04/22/2007  . Essential hypertension 04/22/2007  . Asthma 04/22/2007  . GERD 04/22/2007    Past Surgical History:  Procedure Laterality Date  . CESAREAN SECTION    . KNEE ARTHROPLASTY Right 04/29/2013   Procedure: COMPUTER ASSISTED TOTAL KNEE ARTHROPLASTY;  Surgeon: Marybelle Killings, MD;  Location: [facility];  Service: Orthopedics;  Laterality: Right;  Right Total Knee Arthroplasty, Cemented  . KNEE SURGERY  92, 96, 01, 08   right  . KNEE SURGERY  88   left  . LUMBAR FUSION  05   L4-5 S1  . NASAL POLYP SURGERY  95   sinus  . TUBAL LIGATION       OB History   No obstetric history on file.     Family History  Problem Relation Age of Onset  . Diabetes Father   . Heart disease Father        CAD  . Hyperlipidemia Father   . Hypertension Father   . Pulmonary fibrosis Father   . Hypertension Mother   . Arthritis Mother        OA s/p bilateral hip replacement  . Osteopenia Sister        older with GI problems s/p colectomy  . Cancer Maternal Grandmother  Ovarian Cancer  . Osteopenia Sister     Social History   Tobacco Use  . Smoking status: Never Smoker  . Smokeless tobacco: Never Used  Vaping Use  . Vaping Use: Never used  Substance Use Topics  . Alcohol use: Yes    Alcohol/week: 0.0 standard drinks    Comment: occ alcohol  . Drug use: No    Home Medications Prior to Admission medications   Medication Sig Start Date End Date Taking? Authorizing Provider  Acetaminophen (TYLENOL ARTHRITIS PAIN PO) Take 2 tablets by mouth 2 (two) times daily.    [provider]  amLODipine (NORVASC) 5 MG tablet TAKE 1 TABLET BY MOUTH DAILY. 04/03/17   Lucretia Kern, DO  baclofen (LIORESAL) 10 MG tablet TK 1 T PO TID 08/01/15    [provider]  blood glucose meter kit and supplies KIT Use to test blood sugar once a day 09/17/15   Lucretia Kern, DO  butalbital-acetaminophen-caffeine (FIORICET) [phone] MG tablet Take 1-2 tablets by mouth every 8 (eight) hours as needed for headache. 04/11/20 04/11/21  Tegeler, Gwenyth Allegra, MD  dicyclomine (BENTYL) 10 MG capsule Take 1 tab at bedtime.or as directed 12/12/15   Panosh, Standley Brooking, MD  escitalopram (LEXAPRO) 5 MG tablet Take 1 tablet (5 mg total) by mouth daily. 02/12/17   Lucretia Kern, DO  Fenofibrate 40 MG TABS Take 1 tablet by mouth at bedtime. Patient not taking: Reported on 05/08/2020 09/22/19   [provider]  glimepiride (AMARYL) 4 MG tablet Take 1 tablet (4 mg total) by mouth daily before breakfast. 02/12/17   Lucretia Kern, DO  glucose blood (TRUE METRIX BLOOD GLUCOSE TEST) test strip USE UTD 09/09/17   [provider]  Lactase (DAIRY DIGESTIVE PO) Take by mouth.    [provider]  levocetirizine (XYZAL) 5 MG tablet Take 5 mg by mouth every evening.    [provider]  loperamide (IMODIUM) 2 MG capsule Take by mouth as needed for diarrhea or loose stools.    [provider]  losartan-hydrochlorothiazide (HYZAAR) 100-25 MG tablet TAKE 1 TABLET BY MOUTH EVERY DAY 04/03/17   Colin Benton R, DO  MELATONIN PO Take by mouth.    [provider]  meloxicam (MOBIC) 15 MG tablet Take 1 tablet (15 mg total) by mouth daily. 07/28/18   Edrick Kins, DPM  montelukast (SINGULAIR) 10 MG tablet TAKE 1 TABLET BY MOUTH EVERY DAY 04/12/18   Lucretia Kern, DO  omeprazole-sodium bicarbonate (ZEGERID) 40-1100 MG per capsule Take 1 capsule by mouth daily. 03/16/15   Lucretia Kern, DO  pantoprazole (PROTONIX) 40 MG tablet Take 40 mg by mouth daily. 10/03/19   [provider]  Probiotic Product (PROBIOTIC PO) Take by mouth daily.    [provider]  Psyllium (DAILY FIBER PO) Take by mouth.    [provider]    sitaGLIPtin (JANUVIA) 50 MG tablet Take 50 mg by mouth daily.    [provider]  trolamine salicylate (ASPERCREME) 10 % cream Apply 1 application topically as needed for muscle pain.    [provider]  VASCEPA 1 g capsule Take 2 g by mouth 2 (two) times daily. 10/24/19   [provider]  Vitamin D, Ergocalciferol, (DRISDOL) 1.25 MG (50000 UNIT) CAPS capsule Take 50,000 Units by mouth once a week. 09/21/19   [provider]    Allergies    Metformin and related, Onglyza [saxagliptin], and Penicillins  Review of  Systems   Review of Systems  Constitutional: Positive for activity change, appetite change and fatigue. Negative for chills and fever.  HENT: Negative for ear pain, rhinorrhea, sneezing and sore throat.   Eyes: Negative for photophobia and visual disturbance.  Respiratory: Positive for cough and chest tightness. Negative for shortness of breath and wheezing.   Cardiovascular: Negative for chest pain and palpitations.  Gastrointestinal: Positive for abdominal pain and nausea. Negative for blood in stool, constipation, diarrhea and vomiting.  Genitourinary: Negative for dysuria, hematuria and urgency.  Musculoskeletal: Positive for myalgias.  Skin: Negative for rash.  Neurological: Positive for headaches. Negative for dizziness, weakness and light-headedness.    Physical Exam Updated Vital Signs BP (!) 155/86   Pulse 75   Temp 98.4 F (36.9 C) (Oral)   Resp 16   Ht _0  (1.6 m)   Wt 80.7 kg   LMP 08/10/2014   SpO2 99%   BMI 31.53 kg/m   Physical Exam Vitals and nursing note reviewed.  Constitutional:      General: She is not in acute distress.    Appearance: She is well-developed.  HENT:     Head: Normocephalic and atraumatic.     Nose: Nose normal.  Eyes:     General: No scleral icterus.       Right eye: No discharge.        Left eye: No discharge.     Conjunctiva/sclera: Conjunctivae normal.  Cardiovascular:     Rate and  Rhythm: Normal rate and regular rhythm.     Heart sounds: Normal heart sounds. No murmur heard.  No friction rub. No gallop.   Pulmonary:     Effort: Pulmonary effort is normal. No respiratory distress.     Breath sounds: Normal breath sounds.  Chest:     Chest wall: Tenderness present.    Abdominal:     General: Bowel sounds are normal. There is no distension.     Palpations: Abdomen is soft.     Tenderness: There is no abdominal tenderness. There is no guarding.  Musculoskeletal:        General: Normal range of motion.     Cervical back: Normal range of motion and neck supple.  Skin:    General: Skin is warm and dry.     Findings: No rash.  Neurological:     General: No focal deficit present.     Mental Status: She is alert and oriented to person, place, and time.     Cranial Nerves: No cranial nerve deficit.     Sensory: No sensory deficit.     Motor: No weakness or abnormal muscle tone.     Coordination: Coordination normal.     ED Results / Procedures / Treatments   Labs (all labs ordered are listed, but only abnormal results are displayed) Labs Reviewed  COMPREHENSIVE METABOLIC PANEL - Abnormal; Notable for the following components:      Result Value   Potassium 3.2 (*)    Glucose, Bld 140 (*)    All other components within normal limits  CBC WITH DIFFERENTIAL/PLATELET  LIPASE, BLOOD    EKG EKG Interpretation  Date/Time:  Friday June 15 2020 11:36:33 EDT Ventricular Rate:  74 PR Interval:    QRS Duration: 108 QT Interval:  407 QTC Calculation: 452 R Axis:   42 Text Interpretation: Sinus rhythm Ventricular premature complex Low voltage, precordial leads Nonspecific T abnormalities, lateral leads Confirmed by Virgel Manifold [id number]) on 06/15/2020 1:39:23 PM  Radiology DG Chest Portable 1 View  Result Date: 06/15/2020 CLINICAL DATA:  Weakness, nausea and vomiting. Tested positive for COVID 19 infection last week. EXAM: PORTABLE CHEST 1 VIEW  COMPARISON:  Radiographs 03/14/2013 and 01/23/2007. FINDINGS: 1246 hours. The heart size and mediastinal contours are stable. The lungs remain clear. There is no pleural effusion or pneumothorax. There is a moderate convex right thoracic scoliosis without acute osseous abnormality. Telemetry leads overlie the chest. IMPRESSION: No active cardiopulmonary process. Electronically Signed   By: Richardean Sale M.D.   On: 06/15/2020 13:03    Procedures Procedures (including critical care time)  Medications Ordered in ED Medications  acetaminophen (TYLENOL) tablet 650 mg (has no administration in time range)  sodium chloride 0.9 % bolus 1,000 mL (1,000 mLs Intravenous New Bag/Given (Non-Interop) 06/15/20 1250)  metoCLOPramide (REGLAN) injection 10 mg (10 mg Intravenous Given 06/15/20 1250)  diphenhydrAMINE (BENADRYL) injection 25 mg (25 mg Intravenous Given 06/15/20 1250)    ED Course  I have reviewed the triage vital signs and the nursing notes.  Pertinent labs & imaging results that were available during my care of the patient were reviewed by me and considered in my medical decision making (see chart for details).    MDM Rules/Calculators/A&P                          ROMAINE MACIOLEK was evaluated in Emergency Department on 06/15/20 for the symptoms described in the history of present illness. He/she was evaluated in the context of the global COVID-19 pandemic, which necessitated consideration that the patient might be at risk for infection with the SARS-CoV-2 virus that causes COVID-19. Institutional protocols and algorithms that pertain to the evaluation of patients at risk for COVID-19 are in a state of rapid change based on information released by regulatory bodies including the CDC and federal and state organizations. These policies and algorithms were followed during the patient's care in the ED.  54 year old female with a past medical history of diabetes, hypertension, hyperlipidemia  presenting to the ED with chief complaint of generalized weakness, fatigue, nausea and vomiting.  Diagnosed with Covid on 06/05/2020.  She was evaluated here in the ED on 06/12/2020 and underwent a Mab infusion.  She had some improvement in her symptoms after this.  However she started having similar symptoms again since yesterday.  Reports left-sided chest pressure intermittently as well but this has been going on since she was diagnosed with Covid.  She has been taking Phenergan at home after noting only minimal improvement with Zofran.  Reports cough, shortness of breath, dry heaving, diarrhea and left-sided abdominal pain as well.  On exam patient without any focal tenderness in the abdomen.  Lungs are clear to auscultation bilaterally.  She is not hypoxic, tachycardic or tachypneic.  No lower extremity edema, erythema or calf tenderness that concern me for DVT.  No weakness or numbness noted on exam.  Chest x-ray shows no acute process.  EKG shows sinus rhythm, no ischemic changes.  CMP, CBC and lipase unremarkable.  Suspect that her symptoms are from her Covid infection and viral syndrome.  I doubt ACS, PE or other emergent cause of her pain at this is likely due to coughing and is reproducible on exam.  Abdomen without any focal tenderness.  No signs of severe dehydration.  I did encourage her to continue antipyretics, drink plenty of fluids and continue Phenergan as needed.  Patient to follow-up with PCP.  Return  precautions given.  All imaging, if done today, including plain films, CT scans, and ultrasounds, independently reviewed by me, and interpretations confirmed via formal radiology reads.  Patient is hemodynamically stable, in NAD, and able to ambulate in the ED. Evaluation does not show pathology that would require ongoing emergent intervention or inpatient treatment. I explained the diagnosis to the patient. Pain has been managed and has no complaints prior to discharge. Patient is comfortable with  above plan and is stable for discharge at this time. All questions were answered prior to disposition. Strict return precautions for returning to the ED were discussed. Encouraged follow up with PCP.   An After Visit Summary was printed and given to the patient.   Portions of this note were generated with [employer]. Dictation errors may occur despite best attempts at proofreading.  Final Clinical Impression(s) / ED Diagnoses Final diagnoses:  Viral syndrome  COVID-19 virus infection    Rx / DC Orders ED Discharge Orders    None       Delia Heady, PA-C 06/15/20 1538    Virgel Manifold, MD 06/16/20 1639

## 2020-08-28 ENCOUNTER — Other Ambulatory Visit: Payer: Self-pay

## 2020-08-28 ENCOUNTER — Ambulatory Visit: Payer: BC Managed Care – PPO | Admitting: Podiatry

## 2020-08-28 DIAGNOSIS — E1142 Type 2 diabetes mellitus with diabetic polyneuropathy: Secondary | ICD-10-CM | POA: Diagnosis not present

## 2020-08-28 NOTE — Progress Notes (Signed)
She presents today for follow-up of her painful hammertoe deformities #2 #3 of the left foot.  Objective: Vital signs are stable alert oriented x3 I debrided the reactive hyperkeratotic tissue to the distal aspect of those toes.  They do appear to be somewhat flexible particularly third with more of a rigid second toe left.  Assessment: Hammertoe deformity distal clavus.  Plan: Debrided all reactive hyperkeratotic tissue follow-up with her in the springtime for a tenotomy second and third toes extensor second toe flexor third toe.

## 2021-01-15 DIAGNOSIS — H6122 Impacted cerumen, left ear: Secondary | ICD-10-CM | POA: Insufficient documentation

## 2021-02-20 ENCOUNTER — Other Ambulatory Visit: Payer: Self-pay

## 2021-02-20 ENCOUNTER — Ambulatory Visit: Payer: Self-pay

## 2021-02-20 ENCOUNTER — Ambulatory Visit: Payer: BC Managed Care – PPO | Admitting: Orthopaedic Surgery

## 2021-02-20 ENCOUNTER — Encounter: Payer: Self-pay | Admitting: Orthopaedic Surgery

## 2021-02-20 VITALS — BP 115/77 | HR 92 | Ht 63.0 in | Wt 175.2 lb

## 2021-02-20 DIAGNOSIS — M25562 Pain in left knee: Secondary | ICD-10-CM | POA: Diagnosis not present

## 2021-02-20 NOTE — Progress Notes (Signed)
Office Visit Note   Patient: [name]           Date of Birth: 12-27-1965           MRN: [id number] Visit Date: 02/20/2021              Requested by: Courtney Paris, NP [phone] [address] [place],  [place] [address] PCP: Courtney Paris, NP   Assessment & Plan: Visit Diagnoses:  1. Acute pain of left knee     Plan: Patient is having repetitive catching locking in her knee lateral joint line pain and sometimes occurs going up or down a hill.  She has had repetitive swelling problems not responsive to anti-inflammatories.  She has diabetes and is concerned about hypoglycemia with injection.  Would recommend proceeding with an MRI with her locking symptoms of her left knee.  Office follow-up after scan for review.  Follow-Up Instructions: No follow-ups on file.   Orders:  Orders Placed This Encounter  Procedures  . XR KNEE 3 VIEW LEFT   No orders of the defined types were placed in this encounter.     Procedures: No procedures performed   Clinical Data: No additional findings.   Subjective: Chief Complaint  Patient presents with  . Left Knee - Pain    HPI 55 year old female returns with ongoing left knee pain with catching and locking primarily at the lateral joint line.  Pain has been worse the last several weeks.  She has had intermittent problems with swelling dating back to 89.  She states she cannot kneel on her knee she used to work with the school system with kindergartners and would have to get down on her knee.  She is used Tylenol arthritis occasional Advil.  She has IBS and sometimes anti-inflammatories bother her stomach. No locking problems of the right knee no other joint symptoms other than the left knee pain and locking currently.  Patient does have diabetes. Review of Systems all other systems noncontributory to HPI.   Objective: Vital Signs: BP 115/77   Pulse 92   Ht 5\' 3"  (1.6 m)   Wt 175 lb 3.2 oz (79.5 kg)   LMP 08/10/2014   BMI 31.04 kg/m    Physical Exam Constitutional:      Appearance: She is well-developed.  HENT:     Head: Normocephalic.     Right Ear: External ear normal.     Left Ear: External ear normal.  Eyes:     Pupils: Pupils are equal, round, and reactive to light.  Neck:     Thyroid: No thyromegaly.     Trachea: No tracheal deviation.  Cardiovascular:     Rate and Rhythm: Normal rate.  Pulmonary:     Effort: Pulmonary effort is normal.  Abdominal:     Palpations: Abdomen is soft.  Skin:    General: Skin is warm and dry.  Neurological:     Mental Status: She is alert and oriented to person, place, and time.  Psychiatric:        Behavior: Behavior normal.     Ortho Exam patient has lateral joint line pain with hyperextension crepitus with patellofemoral loading and quadriceps contracture negative patellar subluxation ACL PCL exam is normal.  There is some prominence in the popliteal region consistent with likely small Baker's cyst.  No sciatic notch tenderness no trochanteric bursal tenderness.  Specialty Comments:  No specialty comments available.  Imaging: No results found.   PMFS History: Patient Active Problem List  Diagnosis Date Noted  . Referred otalgia of both ears 01/11/2018  . Temporomandibular joint (TMJ) pain 01/11/2018  . Lateral epicondylitis of left elbow 11/18/2016  . Left wrist pain 11/18/2016  . Fibromyalgia 09/14/2016  . IBS (irritable bowel syndrome) 09/14/2016  . Obesity 09/14/2016  . Anxiety state 08/01/2013  . Osteoarthritis of right knee 04/29/2013    Class: Diagnosis of  . Allergic rhinitis 10/08/2007  . Headache(784.0) 10/08/2007  . Diabetes mellitus ([facility]) 10/08/2007  . Hyperlipemia 04/22/2007  . Essential hypertension 04/22/2007  . Asthma 04/22/2007  . GERD 04/22/2007   Past Medical History:  Diagnosis Date  . ALLERGIC RHINITIS 10/08/2007  . Arthritis   . ASTHMA 04/22/2007  . Diabetes mellitus without complication ([facility])   . GERD 04/22/2007  .  Headache(784.0) 10/08/2007  . HYPERLIPIDEMIA 04/22/2007  . HYPERTENSION 04/22/2007  . IBS (irritable bowel syndrome)    constipation predominant  . LOW BACK PAIN 06/27/2008  . LUQ PAIN 09/06/2010  . NAUSEA 09/06/2010  . PONV (postoperative nausea and vomiting)     Family History  Problem Relation Age of Onset  . Diabetes Father   . Heart disease Father        CAD  . Hyperlipidemia Father   . Hypertension Father   . Pulmonary fibrosis Father   . Hypertension Mother   . Arthritis Mother        OA s/p bilateral hip replacement  . Osteopenia Sister        older with GI problems s/p colectomy  . Cancer Maternal Grandmother        Ovarian Cancer  . Osteopenia Sister     Past Surgical History:  Procedure Laterality Date  . CESAREAN SECTION    . KNEE ARTHROPLASTY Right 04/29/2013   Procedure: COMPUTER ASSISTED TOTAL KNEE ARTHROPLASTY;  Surgeon: Eldred Manges, MD;  Location: [facility];  Service: Orthopedics;  Laterality: Right;  Right Total Knee Arthroplasty, Cemented  . KNEE SURGERY  92, 96, 01, 08   right  . KNEE SURGERY  88   left  . LUMBAR FUSION  05   L4-5 S1  . NASAL POLYP SURGERY  95   sinus  . TUBAL LIGATION     Social History   Occupational History  . Occupation: substitute Magazine features editor: [employer]  Tobacco Use  . Smoking status: Never Smoker  . Smokeless tobacco: Never Used  Vaping Use  . Vaping Use: Never used  Substance and Sexual Activity  . Alcohol use: Yes    Alcohol/week: 0.0 standard drinks    Comment: occ alcohol  . Drug use: No  . Sexual activity: Not on file

## 2021-03-09 ENCOUNTER — Ambulatory Visit
Admission: RE | Admit: 2021-03-09 | Discharge: 2021-03-09 | Disposition: A | Discharge status: Home / Self Care | Payer: BC Managed Care – PPO | Source: Ambulatory Visit | Attending: Orthopaedic Surgery | Admitting: Orthopaedic Surgery

## 2021-03-09 DIAGNOSIS — M25562 Pain in left knee: Secondary | ICD-10-CM

## 2021-03-19 ENCOUNTER — Telehealth: Payer: Self-pay | Admitting: Orthopaedic Surgery

## 2021-03-19 NOTE — Telephone Encounter (Signed)
Pt would like a call with her MRI results.  CB: [phone]

## 2021-03-19 NOTE — Telephone Encounter (Signed)
Please advise 

## 2021-03-20 NOTE — Telephone Encounter (Signed)
I left voicemail for patient requesting return call. Voicemail did not state name, no medical information left in message.

## 2021-03-21 ENCOUNTER — Telehealth: Payer: Self-pay

## 2021-03-21 NOTE — Telephone Encounter (Signed)
I called from [facility] office, no answer. Per Dr. Ophelia Charter, ok to read impression of MRI, however, she will need ROV to discuss with him.

## 2021-03-21 NOTE — Telephone Encounter (Signed)
Patient called she is requesting a call back from Dr.Yates to review her mri results call back:[phone]

## 2021-03-22 NOTE — Telephone Encounter (Signed)
Noted  

## 2021-03-22 NOTE — Telephone Encounter (Signed)
I called patient to read MRI results and explain that she will need ROV to review. She states that she is extremely frustrated because she was not told she would need an appointment after the MRI to review and she does not understand why no one told her this as the MRI was 03/09/2021.  She was also upset with me due to the fact that I called to read the report, left voicemail for return call because she did not answer, she called back a few minutes later, and I did not return her call that same day. I explained that I make phone calls in between patients as we are working in a clinical setting.   She requests return call from Dr. Ophelia Charter if at all possible to review MRI. She does not have health insurance and has read the report on her own. She is concerned about the moderate sized cyst.  Please advise.

## 2021-08-11 ENCOUNTER — Other Ambulatory Visit: Payer: Self-pay

## 2021-08-11 ENCOUNTER — Ambulatory Visit (HOSPITAL_COMMUNITY)
Admission: RE | Admit: 2021-08-11 | Discharge: 2021-08-11 | Disposition: A | Discharge status: Home / Self Care | Payer: No Typology Code available for payment source | Source: Ambulatory Visit | Attending: Emergency Medicine | Admitting: Emergency Medicine

## 2021-08-11 ENCOUNTER — Encounter (HOSPITAL_COMMUNITY): Payer: Self-pay

## 2021-08-11 VITALS — BP 133/82 | HR 72 | Temp 98.1°F | Resp 16

## 2021-08-11 DIAGNOSIS — J209 Acute bronchitis, unspecified: Secondary | ICD-10-CM

## 2021-08-11 DIAGNOSIS — J4541 Moderate persistent asthma with (acute) exacerbation: Secondary | ICD-10-CM | POA: Diagnosis not present

## 2021-08-11 MED ORDER — DOXYCYCLINE HYCLATE 100 MG PO CAPS: 100.0000 mg | ORAL_CAPSULE | Freq: Two times a day (BID) | ORAL | 0 refills | Status: DC

## 2021-08-11 MED ORDER — PREDNISONE 20 MG PO TABS: ORAL_TABLET | ORAL | 0 refills | Status: DC

## 2021-08-11 MED ORDER — ONDANSETRON 4 MG PO TBDP: 4.0000 mg | ORAL_TABLET | Freq: Three times a day (TID) | ORAL | 0 refills | Status: DC | PRN

## 2021-08-11 NOTE — Discharge Instructions (Addendum)
  Please take antibiotics as prescribed and be sure to complete entire course even if you start to feel better to ensure infection does not come back.  You may take 500mg  acetaminophen every 4-6 hours or in combination with ibuprofen 400-600mg  every 6-8 hours as needed for pain, inflammation, and fever.  Be sure to well hydrated with clear liquids and get at least 8 hours of sleep at night, preferably more while sick.   Please follow up with family medicine in 4-5 days if not improving, sooner if worsening.

## 2021-08-11 NOTE — ED Provider Notes (Signed)
[place]    CSN: [id number] Arrival date & time: 08/11/21  1255      History   Chief Complaint Chief Complaint  Patient presents with   Appointment    HPI SANJUANA MRUK is a 55 y.o. female.   HPI KINDRA BICKHAM is a 55 y.o. female presenting to UC with c/o 1 week of worsening symptoms of cough, congestion, sore throat.  Congestion started in sinuses but has "moved into her chest."  Associated intermittent SOB secondary to her asthma, relieved with her albuterol inhaler.  Fever of 100.4*F 2 days ago.  Associated bilateral rib/side pain from coughing.  Denies n/v/d.    Past Medical History:  Diagnosis Date   ALLERGIC RHINITIS 10/08/2007   Arthritis    ASTHMA 04/22/2007   Diabetes mellitus without complication ([place])    GERD 04/22/2007   Headache(784.0) 10/08/2007   HYPERLIPIDEMIA 04/22/2007   HYPERTENSION 04/22/2007   IBS (irritable bowel syndrome)    constipation predominant   LOW BACK PAIN 06/27/2008   LUQ PAIN 09/06/2010   NAUSEA 09/06/2010   PONV (postoperative nausea and vomiting)     Patient Active Problem List   Diagnosis Date Noted   Referred otalgia of both ears 01/11/2018   Temporomandibular joint (TMJ) pain 01/11/2018   Lateral epicondylitis of left elbow 11/18/2016   Left wrist pain 11/18/2016   Fibromyalgia 09/14/2016   IBS (irritable bowel syndrome) 09/14/2016   Obesity 09/14/2016   Anxiety state 08/01/2013   Osteoarthritis of right knee 04/29/2013    Class: Diagnosis of   Allergic rhinitis 10/08/2007   Headache(784.0) 10/08/2007   Diabetes mellitus ([place]) 10/08/2007   Hyperlipemia 04/22/2007   Essential hypertension 04/22/2007   Asthma 04/22/2007   GERD 04/22/2007    Past Surgical History:  Procedure Laterality Date   CESAREAN SECTION     KNEE ARTHROPLASTY Right 04/29/2013   Procedure: COMPUTER ASSISTED TOTAL KNEE ARTHROPLASTY;  Surgeon: Marybelle Killings, MD;  Location: [facility];  Service: Orthopedics;  Laterality: Right;  Right Total Knee  Arthroplasty, Cemented   KNEE SURGERY  92, 96, 01, 08   right   KNEE SURGERY  88   left   LUMBAR FUSION  05   L4-5 S1   NASAL POLYP SURGERY  95   sinus   TUBAL LIGATION      OB History   No obstetric history on file.      Home Medications    Prior to Admission medications   Medication Sig Start Date End Date Taking? Authorizing Provider  doxycycline (VIBRAMYCIN) 100 MG capsule Take 1 capsule (100 mg total) by mouth 2 (two) times daily. 08/11/21  Yes Ashland Osmer O, PA-C  ondansetron (ZOFRAN ODT) 4 MG disintegrating tablet Take 1 tablet (4 mg total) by mouth every 8 (eight) hours as needed for nausea or vomiting. 08/11/21  Yes Greysen Devino, Bronwen Betters, PA-C  predniSONE (DELTASONE) 20 MG tablet 3 tabs po day one, then 2 po daily x 4 days 08/11/21  Yes Latrisha Coiro, Bronwen Betters, PA-C  Acetaminophen (TYLENOL ARTHRITIS PAIN PO) Take 2 tablets by mouth 2 (two) times daily.    [provider]  amLODipine (NORVASC) 5 MG tablet TAKE 1 TABLET BY MOUTH DAILY. 04/03/17   Lucretia Kern, DO  baclofen (LIORESAL) 10 MG tablet TK 1 T PO TID 08/01/15   [provider]  blood glucose meter kit and supplies KIT Use to test blood sugar once a day 09/17/15   Lucretia Kern, DO  dicyclomine (BENTYL) 10 MG capsule Take 1 tab at bedtime.or as directed 12/12/15   Panosh, Standley Brooking, MD  escitalopram (LEXAPRO) 5 MG tablet Take 1 tablet (5 mg total) by mouth daily. 02/12/17   Lucretia Kern, DO  Fenofibrate 40 MG TABS Take 1 tablet by mouth at bedtime.  09/22/19   [provider]  fenofibrate micronized (LOFIBRA) 67 MG capsule Take 67 mg by mouth daily. 07/30/20   [provider]  glimepiride (AMARYL) 4 MG tablet Take 1 tablet (4 mg total) by mouth daily before breakfast. 02/12/17   Colin Benton R, DO  glucose blood test strip USE UTD 09/09/17   [provider]  Lactase (DAIRY DIGESTIVE PO) Take by mouth.    [provider]  levocetirizine (XYZAL) 5 MG tablet Take 5 mg by mouth every  evening.    [provider]  loperamide (IMODIUM) 2 MG capsule Take by mouth as needed for diarrhea or loose stools.    [provider]  losartan-hydrochlorothiazide (HYZAAR) 100-25 MG tablet TAKE 1 TABLET BY MOUTH EVERY DAY 04/03/17   Colin Benton R, DO  MELATONIN PO Take by mouth.    [provider]  meloxicam (MOBIC) 15 MG tablet Take 1 tablet (15 mg total) by mouth daily. 07/28/18   Edrick Kins, DPM  montelukast (SINGULAIR) 10 MG tablet TAKE 1 TABLET BY MOUTH EVERY DAY 04/12/18   Lucretia Kern, DO  omeprazole-sodium bicarbonate (ZEGERID) 40-1100 MG per capsule Take 1 capsule by mouth daily. 03/16/15   Lucretia Kern, DO  ondansetron (ZOFRAN) 4 MG tablet Take 4 mg by mouth 3 (three) times daily as needed. 06/07/20   [provider]  pantoprazole (PROTONIX) 40 MG tablet Take 40 mg by mouth daily. 10/03/19   [provider]  phentermine 15 MG capsule Take by mouth. 03/19/20   [provider]  pregabalin (LYRICA) 75 MG capsule Take 75 mg by mouth 2 (two) times daily. 08/10/20   [provider]  Probiotic Product (PROBIOTIC PO) Take by mouth daily.    [provider]  promethazine (PHENERGAN) 12.5 MG tablet Take by mouth. 06/11/20   [provider]  Psyllium (DAILY FIBER PO) Take by mouth.    [provider]  sitaGLIPtin (JANUVIA) 50 MG tablet Take 50 mg by mouth daily.    [provider]  SYMBICORT 160-4.5 MCG/ACT inhaler SMARTSIG:2 Puff(s) By Mouth Twice Daily 06/05/20   [provider]  topiramate (TOPAMAX) 50 MG tablet Take 50 mg by mouth 2 (two) times daily. 08/25/20   [provider]  trolamine salicylate (ASPERCREME) 10 % cream Apply 1 application topically as needed for muscle pain.    [provider]  VASCEPA 1 g capsule Take 2 g by mouth 2 (two) times daily. 10/24/19   [provider]  Vitamin D, Ergocalciferol, (DRISDOL) 1.25 MG (50000 UNIT) CAPS capsule Take 50,000  Units by mouth once a week. 09/21/19   [provider]    Family History Family History  Problem Relation Age of Onset   Diabetes Father    Heart disease Father        CAD   Hyperlipidemia Father    Hypertension Father    Pulmonary fibrosis Father    Hypertension Mother    Arthritis Mother        OA s/p bilateral hip replacement   Osteopenia Sister        older with GI problems s/p colectomy   Cancer Maternal  Grandmother        Ovarian Cancer   Osteopenia Sister     Social History Social History   Tobacco Use   Smoking status: Never   Smokeless tobacco: Never  Vaping Use   Vaping Use: Never used  Substance Use Topics   Alcohol use: Yes    Alcohol/week: 0.0 standard drinks    Comment: occ alcohol   Drug use: No     Allergies   Metformin and related, Onglyza [saxagliptin], and Penicillins   Review of Systems Review of Systems  Constitutional:  Positive for appetite change (decreased) and fever. Negative for chills.  HENT:  Positive for congestion, ear pain (Left), sinus pressure, sinus pain and sore throat. Negative for trouble swallowing and voice change.   Respiratory:  Positive for cough. Negative for shortness of breath.   Cardiovascular:  Negative for chest pain and palpitations.  Gastrointestinal:  Negative for abdominal pain, diarrhea, nausea and vomiting.  Musculoskeletal:  Negative for arthralgias, back pain and myalgias.  Skin:  Negative for rash.  Neurological:  Negative for dizziness, weakness, light-headedness and headaches.  All other systems reviewed and are negative.   Physical Exam Triage Vital Signs ED Triage Vitals  Enc Vitals Group     BP 08/11/21 1341 133/82     Pulse Rate 08/11/21 1341 72     Resp 08/11/21 1341 16     Temp 08/11/21 1341 98.1 F (36.7 C)     Temp Source 08/11/21 1341 Oral     SpO2 08/11/21 1341 100 %     Weight --      Height --      Head Circumference --      Peak Flow --      Pain Score 08/11/21 1335  8     Pain Loc --      Pain Edu? --      Excl. in [place]? --    No data found.  Updated Vital Signs BP 133/82   Pulse 72   Temp 98.1 F (36.7 C) (Oral)   Resp 16   LMP 08/10/2014   SpO2 100%   Visual Acuity Right Eye Distance:   Left Eye Distance:   Bilateral Distance:    Right Eye Near:   Left Eye Near:    Bilateral Near:     Physical Exam Vitals and nursing note reviewed.  Constitutional:      General: She is not in acute distress.    Appearance: Normal appearance. She is well-developed. She is not ill-appearing, toxic-appearing or diaphoretic.  HENT:     Head: Normocephalic and atraumatic.     Right Ear: Tympanic membrane and ear canal normal.     Left Ear: Tympanic membrane and ear canal normal.     Mouth/Throat:     Mouth: Mucous membranes are moist.     Pharynx: Oropharynx is clear.  Cardiovascular:     Rate and Rhythm: Normal rate and regular rhythm.  Pulmonary:     Effort: Pulmonary effort is normal.     Breath sounds: Wheezing (faint diffuse) and rhonchi (faint, diffuse) present.  Musculoskeletal:        General: Normal range of motion.     Cervical back: Normal range of motion and neck supple. No rigidity or tenderness.  Lymphadenopathy:     Cervical: No cervical adenopathy.  Skin:    General: Skin is warm and dry.  Neurological:     Mental Status: She is alert and oriented to person,  place, and time.  Psychiatric:        Behavior: Behavior normal.     UC Treatments / Results  Labs (all labs ordered are listed, but only abnormal results are displayed) Labs Reviewed - No data to display  EKG   Radiology No results found.  Procedures Procedures (including critical care time)  Medications Ordered in UC Medications - No data to display  Initial Impression / Assessment and Plan / UC Course  I have reviewed the triage vital signs and the nursing notes.  Pertinent labs & imaging results that were available during my care of the patient were  reviewed by me and considered in my medical decision making (see chart for details).     Hx and exam c/w acute bronchitis secondary to viral URI Due to worsening symptoms with development of fever 2 days ago, will cover for bacterial infection. Rx: Doxycycline (has done well with this before) and prednisone for asthma exacerbation.  F/u with PCP later this week if not improving AVS given  Final Clinical Impressions(s) / UC Diagnoses   Final diagnoses:  Acute bronchitis, unspecified organism     Discharge Instructions       Please take antibiotics as prescribed and be sure to complete entire course even if you start to feel better to ensure infection does not come back.  You may take 569m acetaminophen every 4-6 hours or in combination with ibuprofen 400-6014mevery 6-8 hours as needed for pain, inflammation, and fever.  Be sure to well hydrated with clear liquids and get at least 8 hours of sleep at night, preferably more while sick.   Please follow up with family medicine in 4-5 days if not improving, sooner if worsening.      ED Prescriptions     Medication Sig Dispense Auth. Provider   ondansetron (ZOFRAN ODT) 4 MG disintegrating tablet Take 1 tablet (4 mg total) by mouth every 8 (eight) hours as needed for nausea or vomiting. 20 tablet PhLeeroy Cha, PA-C   doxycycline (VIBRAMYCIN) 100 MG capsule Take 1 capsule (100 mg total) by mouth 2 (two) times daily. 20 capsule PhGerarda FractionErin O, PA-C   predniSONE (DELTASONE) 20 MG tablet 3 tabs po day one, then 2 po daily x 4 days 11 tablet PhNoe GensPAVermont    PDMP not reviewed this encounter.   PhNoe GensPAVermont1/06/22 1456

## 2021-08-11 NOTE — ED Triage Notes (Signed)
PT reports sore throat and congestion for a few days, this has "moved to her chest". Reports shortness of breath, asthma exacerbations, and rib pain.

## 2021-08-16 ENCOUNTER — Encounter (HOSPITAL_COMMUNITY): Payer: Self-pay

## 2021-08-16 ENCOUNTER — Ambulatory Visit (INDEPENDENT_AMBULATORY_CARE_PROVIDER_SITE_OTHER): Payer: No Typology Code available for payment source

## 2021-08-16 ENCOUNTER — Ambulatory Visit (HOSPITAL_COMMUNITY)
Admission: RE | Admit: 2021-08-16 | Discharge: 2021-08-16 | Disposition: A | Discharge status: Home / Self Care | Payer: No Typology Code available for payment source | Source: Ambulatory Visit | Attending: Urgent Care | Admitting: Urgent Care

## 2021-08-16 ENCOUNTER — Other Ambulatory Visit: Payer: Self-pay

## 2021-08-16 VITALS — BP 149/99 | HR 78 | Temp 97.9°F | Resp 17

## 2021-08-16 DIAGNOSIS — R062 Wheezing: Secondary | ICD-10-CM

## 2021-08-16 DIAGNOSIS — J4540 Moderate persistent asthma, uncomplicated: Secondary | ICD-10-CM | POA: Insufficient documentation

## 2021-08-16 DIAGNOSIS — E119 Type 2 diabetes mellitus without complications: Secondary | ICD-10-CM | POA: Insufficient documentation

## 2021-08-16 DIAGNOSIS — E118 Type 2 diabetes mellitus with unspecified complications: Secondary | ICD-10-CM

## 2021-08-16 DIAGNOSIS — R052 Subacute cough: Secondary | ICD-10-CM | POA: Diagnosis present

## 2021-08-16 DIAGNOSIS — R059 Cough, unspecified: Secondary | ICD-10-CM

## 2021-08-16 LAB — RESPIRATORY PANEL BY PCR

## 2021-08-16 MED ORDER — PREDNISONE 20 MG PO TABS: ORAL_TABLET | ORAL | 0 refills | Status: AC

## 2021-08-16 MED ORDER — PROMETHAZINE-DM 6.25-15 MG/5ML PO SYRP: 5.0000 mL | ORAL_SOLUTION | Freq: Every evening | ORAL | 0 refills | Status: AC | PRN

## 2021-08-16 NOTE — ED Provider Notes (Signed)
[facility]   MRN: [id number] DOB: [birth year]  Subjective:   [name] is a 55 y.o. female presenting for persistent wheezing, coughing.  Was last seen here 08/11/2021.  Was started on doxycycline, prednisone and recommended to use supportive care.  Imaging and labs were not obtained.  The cough has started to give her some low back pain.  She is also had difficulty with constipation.  She has a history of asthma, decided to restart her Flovent.  Has been using her albuterol inhaler too.  No current facility-administered medications for this encounter.  Current Outpatient Medications:    Acetaminophen (TYLENOL ARTHRITIS PAIN PO), Take 2 tablets by mouth 2 (two) times daily., Disp: , Rfl:    amLODipine (NORVASC) 5 MG tablet, TAKE 1 TABLET BY MOUTH DAILY., Disp: 90 tablet, Rfl: 0   baclofen (LIORESAL) 10 MG tablet, TK 1 T PO TID, Disp: , Rfl: 0   blood glucose meter kit and supplies KIT, Use to test blood sugar once a day, Disp: 1 each, Rfl: 0   dicyclomine (BENTYL) 10 MG capsule, Take 1 tab at bedtime.or as directed, Disp: 30 capsule, Rfl: 0   doxycycline (VIBRAMYCIN) 100 MG capsule, Take 1 capsule (100 mg total) by mouth 2 (two) times daily., Disp: 20 capsule, Rfl: 0   escitalopram (LEXAPRO) 5 MG tablet, Take 1 tablet (5 mg total) by mouth daily., Disp: 90 tablet, Rfl: 0   Fenofibrate 40 MG TABS, Take 1 tablet by mouth at bedtime. , Disp: , Rfl:    fenofibrate micronized (LOFIBRA) 67 MG capsule, Take 67 mg by mouth daily., Disp: , Rfl:    glimepiride (AMARYL) 4 MG tablet, Take 1 tablet (4 mg total) by mouth daily before breakfast., Disp: 90 tablet, Rfl: 0   glucose blood test strip, USE UTD, Disp: , Rfl:    Lactase (DAIRY DIGESTIVE PO), Take by mouth., Disp: , Rfl:    levocetirizine (XYZAL) 5 MG tablet, Take 5 mg by mouth every evening., Disp: , Rfl:    loperamide (IMODIUM) 2 MG capsule, Take by mouth as needed for diarrhea or loose stools., Disp: , Rfl:     losartan-hydrochlorothiazide (HYZAAR) 100-25 MG tablet, TAKE 1 TABLET BY MOUTH EVERY DAY, Disp: 90 tablet, Rfl: 0   MELATONIN PO, Take by mouth., Disp: , Rfl:    meloxicam (MOBIC) 15 MG tablet, Take 1 tablet (15 mg total) by mouth daily., Disp: 1 tablet, Rfl: 0   montelukast (SINGULAIR) 10 MG tablet, TAKE 1 TABLET BY MOUTH EVERY DAY, Disp: 30 tablet, Rfl: 0   omeprazole-sodium bicarbonate (ZEGERID) 40-1100 MG per capsule, Take 1 capsule by mouth daily., Disp: 90 capsule, Rfl: 3   ondansetron (ZOFRAN ODT) 4 MG disintegrating tablet, Take 1 tablet (4 mg total) by mouth every 8 (eight) hours as needed for nausea or vomiting., Disp: 20 tablet, Rfl: 0   ondansetron (ZOFRAN) 4 MG tablet, Take 4 mg by mouth 3 (three) times daily as needed., Disp: , Rfl:    pantoprazole (PROTONIX) 40 MG tablet, Take 40 mg by mouth daily., Disp: , Rfl:    phentermine 15 MG capsule, Take by mouth., Disp: , Rfl:    predniSONE (DELTASONE) 20 MG tablet, 3 tabs po day one, then 2 po daily x 4 days, Disp: 11 tablet, Rfl: 0   pregabalin (LYRICA) 75 MG capsule, Take 75 mg by mouth 2 (two) times daily., Disp: , Rfl:    Probiotic Product (PROBIOTIC PO), Take by mouth daily., Disp: ,  Rfl:    promethazine (PHENERGAN) 12.5 MG tablet, Take by mouth., Disp: , Rfl:    Psyllium (DAILY FIBER PO), Take by mouth., Disp: , Rfl:    sitaGLIPtin (JANUVIA) 50 MG tablet, Take 50 mg by mouth daily., Disp: , Rfl:    SYMBICORT 160-4.5 MCG/ACT inhaler, SMARTSIG:2 Puff(s) By Mouth Twice Daily, Disp: , Rfl:    topiramate (TOPAMAX) 50 MG tablet, Take 50 mg by mouth 2 (two) times daily., Disp: , Rfl:    trolamine salicylate (ASPERCREME) 10 % cream, Apply 1 application topically as needed for muscle pain., Disp: , Rfl:    VASCEPA 1 g capsule, Take 2 g by mouth 2 (two) times daily., Disp: , Rfl:    Vitamin D, Ergocalciferol, (DRISDOL) 1.25 MG (50000 UNIT) CAPS capsule, Take 50,000 Units by mouth once a week., Disp: , Rfl:    Allergies  Allergen Reactions    Metformin And Related Nausea Only    nausea   Onglyza [Saxagliptin] Nausea Only   Penicillins     Severe reaction - hospitlaized    Past Medical History:  Diagnosis Date   ALLERGIC RHINITIS 10/08/2007   Arthritis    ASTHMA 04/22/2007   Diabetes mellitus without complication ([place])    GERD 04/22/2007   Headache(784.0) 10/08/2007   HYPERLIPIDEMIA 04/22/2007   HYPERTENSION 04/22/2007   IBS (irritable bowel syndrome)    constipation predominant   LOW BACK PAIN 06/27/2008   LUQ PAIN 09/06/2010   NAUSEA 09/06/2010   PONV (postoperative nausea and vomiting)      Past Surgical History:  Procedure Laterality Date   CESAREAN SECTION     KNEE ARTHROPLASTY Right 04/29/2013   Procedure: COMPUTER ASSISTED TOTAL KNEE ARTHROPLASTY;  Surgeon: Marybelle Killings, MD;  Location: [facility];  Service: Orthopedics;  Laterality: Right;  Right Total Knee Arthroplasty, Cemented   KNEE SURGERY  92, 96, 01, 08   right   KNEE SURGERY  88   left   LUMBAR FUSION  05   L4-5 S1   NASAL POLYP SURGERY  95   sinus   TUBAL LIGATION      Family History  Problem Relation Age of Onset   Diabetes Father    Heart disease Father        CAD   Hyperlipidemia Father    Hypertension Father    Pulmonary fibrosis Father    Hypertension Mother    Arthritis Mother        OA s/p bilateral hip replacement   Osteopenia Sister        older with GI problems s/p colectomy   Cancer Maternal Grandmother        Ovarian Cancer   Osteopenia Sister     Social History   Tobacco Use   Smoking status: Never   Smokeless tobacco: Never  Vaping Use   Vaping Use: Never used  Substance Use Topics   Alcohol use: Yes    Alcohol/week: 0.0 standard drinks    Comment: occ alcohol   Drug use: No    ROS   Objective:   Vitals: BP (!) 149/99 (BP Location: Left Arm)   Pulse 78   Temp 97.9 F (36.6 C) (Oral)   Resp 17   LMP 08/10/2014   SpO2 98%   Physical Exam Constitutional:      General: She is not in acute distress.     Appearance: Normal appearance. She is well-developed. She is not ill-appearing, toxic-appearing or diaphoretic.  HENT:     Head:  Normocephalic and atraumatic.     Nose: Nose normal.     Mouth/Throat:     Mouth: Mucous membranes are moist.  Eyes:     Extraocular Movements: Extraocular movements intact.     Pupils: Pupils are equal, round, and reactive to light.  Cardiovascular:     Rate and Rhythm: Normal rate and regular rhythm.     Pulses: Normal pulses.     Heart sounds: Normal heart sounds. No murmur heard.   No friction rub. No gallop.  Pulmonary:     Effort: Pulmonary effort is normal. No respiratory distress.     Breath sounds: No stridor. Decreased breath sounds (slightly over lower lung fields) present. No wheezing, rhonchi or rales.  Abdominal:     Tenderness: There is no right CVA tenderness or left CVA tenderness.  Skin:    General: Skin is warm and dry.     Findings: No rash.  Neurological:     Mental Status: She is alert and oriented to person, place, and time.  Psychiatric:        Mood and Affect: Mood normal.        Behavior: Behavior normal.        Thought Content: Thought content normal.    DG Chest 2 View  Result Date: 08/16/2021 CLINICAL DATA:  Coughing and wheezing. EXAM: CHEST - 2 VIEW COMPARISON:  Chest x-ray 06/15/2020. FINDINGS: The heart size and mediastinal contours are within normal limits. Both lungs are clear. Lumbar fusion hardware is partially visualized. No acute fractures are seen. There is dextroconvex curvature of the thoracic spine, unchanged. IMPRESSION: No active cardiopulmonary disease. Electronically Signed   By: Ronney Asters M.D.   On: 08/16/2021 15:22     Assessment and Plan :   PDMP not reviewed this encounter.  1. Moderate persistent asthma without complication   2. Subacute cough   3. Type 2 diabetes mellitus treated without insulin ([place])    At discharge, patient reports that she actually is feeling a lot better but wanted to  make sure she got double checked.  Offered her another prednisone course which she was agreeable to because she is still using her albuterol inhaler multiple times throughout the day.  Recommended she maintain her Flovent.  Use supportive care otherwise. Counseled patient on potential for adverse effects with medications prescribed/recommended today, ER and return-to-clinic precautions discussed, patient verbalized understanding.    Jaynee Eagles, PA-C 08/16/21 1540

## 2021-08-16 NOTE — ED Triage Notes (Signed)
Pt presents for follow-up from 11/06 visit. States symptoms have gotten some what better.

## 2021-10-25 ENCOUNTER — Other Ambulatory Visit: Payer: Self-pay | Admitting: Neurosurgery

## 2021-10-25 DIAGNOSIS — M546 Pain in thoracic spine: Secondary | ICD-10-CM

## 2021-10-25 DIAGNOSIS — M545 Low back pain, unspecified: Secondary | ICD-10-CM

## 2022-05-14 ENCOUNTER — Ambulatory Visit (INDEPENDENT_AMBULATORY_CARE_PROVIDER_SITE_OTHER): Payer: BC Managed Care – PPO

## 2022-05-14 ENCOUNTER — Ambulatory Visit: Payer: BC Managed Care – PPO | Admitting: Podiatry

## 2022-05-14 ENCOUNTER — Encounter: Payer: Self-pay | Admitting: Podiatry

## 2022-05-14 DIAGNOSIS — M722 Plantar fascial fibromatosis: Secondary | ICD-10-CM

## 2022-05-14 DIAGNOSIS — M7662 Achilles tendinitis, left leg: Secondary | ICD-10-CM | POA: Diagnosis not present

## 2022-05-14 MED ORDER — TRIAMCINOLONE ACETONIDE 40 MG/ML IJ SUSP
40.0000 mg | Freq: Once | INTRAMUSCULAR | Status: AC
  Administered 2022-05-14: 40 mg

## 2022-05-14 NOTE — Progress Notes (Signed)
Molly Maduro presents today chief complaint of heel pain left states that the right 1 is hurting also.  Objective: Vital signs are stable alert oriented x 3.  Pulses are palpable.  She has no erythema edema cellulitis drainage noted she has pain on palpation medial calcaneal tubercle and some compensatory pain across the foot.  Assessment: Planter fasciitis.  Plan: Injected the bilateral heels today 20 mg Kenalog 5 mg Marcaine point maximal tenderness.  Tolerated procedure well.

## 2022-05-15 ENCOUNTER — Ambulatory Visit: Payer: BC Managed Care – PPO | Admitting: Podiatry

## 2022-05-19 ENCOUNTER — Telehealth: Payer: Self-pay | Admitting: *Deleted

## 2022-05-19 NOTE — Telephone Encounter (Signed)
Patient is still having more pain with feet after injections, is taking tylenol, icing, exchanging shoes out as instructed. Any other suggestions: Please advise.

## 2022-05-19 NOTE — Telephone Encounter (Signed)
Called patient, no answer, left physician's recommendations and to call back if interested in the boot.

## 2022-05-20 NOTE — Telephone Encounter (Signed)
Called, no answer, left voice message of recommendations per physician

## 2022-06-26 ENCOUNTER — Ambulatory Visit: Payer: BC Managed Care – PPO | Admitting: Podiatry

## 2022-06-26 DIAGNOSIS — E1142 Type 2 diabetes mellitus with diabetic polyneuropathy: Secondary | ICD-10-CM | POA: Diagnosis not present

## 2022-06-26 DIAGNOSIS — M778 Other enthesopathies, not elsewhere classified: Secondary | ICD-10-CM | POA: Diagnosis not present

## 2022-06-28 NOTE — Progress Notes (Signed)
She presents today with pain to the second toe of the left foot.  She states that she has hammertoes and like to consider getting it fixed.  She also has pain and here she points to the subtalar joint left foot.  Objective: Vital signs are stable alert and oriented x3.  Pulses are palpable.  She has hammertoe deformities mild hallux valgus deformities primarily flexible in nature left greater than right.  She also has pain on end range of motion of the subtalar joint and pain on palpation of the sinus tarsi left foot.  No open lesions or wounds.  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  We discussed in great detail today surgery for hammertoe corrections including metatarsal osteotomies hammertoe correction with K wires and or screws.  The extent of time that it would take for this to heal after the pins were placed before being removed.  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  We also injected the subtalar joint today 20 mg Kenalog 5 mg Marcaine to the point of maximal tenderness.  I will follow-up with her as needed when she is ready to discuss surgery.

## 2022-09-12 ENCOUNTER — Ambulatory Visit: Payer: BC Managed Care – PPO | Admitting: Orthopaedic Surgery

## 2022-09-12 ENCOUNTER — Ambulatory Visit (INDEPENDENT_AMBULATORY_CARE_PROVIDER_SITE_OTHER): Payer: BC Managed Care – PPO

## 2022-09-12 VITALS — BP 159/89 | HR 83 | Ht 63.0 in | Wt 175.2 lb

## 2022-09-12 DIAGNOSIS — M79641 Pain in right hand: Secondary | ICD-10-CM | POA: Diagnosis not present

## 2022-09-12 NOTE — Progress Notes (Signed)
Office Visit Note   Patient: [name]           Date of Birth: [birth year]           MRN: [id number] Visit Date: 09/12/2022              Requested by: Courtney Paris, NP [address] [place],  [place] [address] PCP: Courtney Paris, NP   Assessment & Plan: Visit Diagnoses:  1. Pain in right hand     Plan: X-rays are negative for fracture.  She can use a wrist splint intermittently continue ibuprofen some ice as needed and then work on gentle wrist range of motion.  She will call if she has persistent problems.  Follow-Up Instructions: No follow-ups on file.   Orders:  Orders Placed This Encounter  Procedures   XR Hand Complete Right   No orders of the defined types were placed in this encounter.     Procedures: No procedures performed   Clinical Data: No additional findings.   Subjective: Chief Complaint  Patient presents with   Right Hand - Pain    HPI 56-year-old female fell Monday tripped landing on her right hand with pain in her wrist primarily along the ulnar aspect but some at the radial scaphoid joint.  She has had stiffness and pain with range of motion.  No numbness or tingling no ecchymosis.  No past history of injury to the wrist that she recalls.  She has had cervical spondylosis lumbar disc degeneration.  Her son is a long-term patient of ours as well.  She has been able to flex and extend her fingers.  She has a wrist splint that she has been using intermittently and also using ibuprofen as needed if discomfort was worse.  Review of Systems all other systems are noncontributory.   Objective: Vital Signs: BP (!) 159/89   Pulse 83   Ht 5\' 3"  (1.6 m)   Wt 175 lb 3.2 oz (79.5 kg)   LMP 08/10/2014   BMI 31.04 kg/m   Physical Exam Constitutional:      Appearance: She is well-developed.  HENT:     Head: Normocephalic.     Right Ear: External ear normal.     Left Ear: External ear normal. There is no impacted cerumen.  Eyes:     Pupils:  Pupils are equal, round, and reactive to light.  Neck:     Thyroid: No thyromegaly.     Trachea: No tracheal deviation.  Cardiovascular:     Rate and Rhythm: Normal rate.  Pulmonary:     Effort: Pulmonary effort is normal.  Abdominal:     Palpations: Abdomen is soft.  Musculoskeletal:     Cervical back: No rigidity.  Skin:    General: Skin is warm and dry.  Neurological:     Mental Status: She is alert and oriented to person, place, and time.  Psychiatric:        Behavior: Behavior normal.     Ortho Exam with elevated side right forearm has full supination pronation.  Tenderness over the ECU and TFCC region.  No ecchymosis.  No definite wrist effusion noted.  Mild tenderness over the scaphoid snuffbox.  Tuberosity of the scaphoid nontender.  Dorsal compartments are normal good finger extension and finger flexion.  She has 30 to 50% wrist flexion extension limited by pain.  Specialty Comments:  No specialty comments available.  Imaging: XR Hand Complete Right  Result Date: 09/12/2022 Three-view x-rays right  hand obtained and reviewed.  Negative for degenerative changes no acute injury noted. Impression: Right wrist and hand negative for acute changes.    PMFS History: Patient Active Problem List   Diagnosis Date Noted   Excessive cerumen in ear canal, left 01/15/2021   Referred otalgia of both ears 01/11/2018   Temporomandibular joint (TMJ) pain 01/11/2018   Lateral epicondylitis of left elbow 11/18/2016   Left wrist pain 11/18/2016   Fibromyalgia 09/14/2016   IBS (irritable bowel syndrome) 09/14/2016   Obesity 09/14/2016   Cervical spondylosis without myelopathy 10/09/2015   Left flank pain 12/26/2014   Degeneration of lumbosacral intervertebral disc 09/05/2014   Displacement of lumbar intervertebral disc without myelopathy 08/22/2014   Lumbar radiculopathy 08/22/2014   Anxiety state 08/01/2013   Osteoarthritis of right knee 04/29/2013    Class: Diagnosis of    Allergic rhinitis 10/08/2007   Headache(784.0) 10/08/2007   Diabetes mellitus ([place]) 10/08/2007   Hyperlipemia 04/22/2007   Essential hypertension 04/22/2007   Asthma 04/22/2007   GERD 04/22/2007   Past Medical History:  Diagnosis Date   ALLERGIC RHINITIS 10/08/2007   Arthritis    ASTHMA 04/22/2007   Diabetes mellitus without complication ([place])    GERD 04/22/2007   Headache(784.0) 10/08/2007   HYPERLIPIDEMIA 04/22/2007   HYPERTENSION 04/22/2007   IBS (irritable bowel syndrome)    constipation predominant   LOW BACK PAIN 06/27/2008   LUQ PAIN 09/06/2010   NAUSEA 09/06/2010   PONV (postoperative nausea and vomiting)     Family History  Problem Relation Age of Onset   Diabetes Father    Heart disease Father        CAD   Hyperlipidemia Father    Hypertension Father    Pulmonary fibrosis Father    Hypertension Mother    Arthritis Mother        OA s/p bilateral hip replacement   Osteopenia Sister        older with GI problems s/p colectomy   Cancer Maternal Grandmother        Ovarian Cancer   Osteopenia Sister     Past Surgical History:  Procedure Laterality Date   CESAREAN SECTION     KNEE ARTHROPLASTY Right 04/29/2013   Procedure: COMPUTER ASSISTED TOTAL KNEE ARTHROPLASTY;  Surgeon: Marybelle Killings, MD;  Location: [facility];  Service: Orthopedics;  Laterality: Right;  Right Total Knee Arthroplasty, Cemented   KNEE SURGERY  92, 96, 01, 08   right   KNEE SURGERY  88   left   LUMBAR FUSION  05   L4-5 S1   NASAL POLYP SURGERY  95   sinus   TUBAL LIGATION     Social History   Occupational History   Occupation: substitute Product manager: [employer]  Tobacco Use   Smoking status: Never   Smokeless tobacco: Never  Vaping Use   Vaping Use: Never used  Substance and Sexual Activity   Alcohol use: Yes    Alcohol/week: 0.0 standard drinks of alcohol    Comment: occ alcohol   Drug use: No   Sexual activity: Not on file

## 2022-10-23 ENCOUNTER — Ambulatory Visit: Payer: BC Managed Care – PPO | Admitting: Podiatry

## 2022-11-06 ENCOUNTER — Ambulatory Visit: Payer: BC Managed Care – PPO | Admitting: Podiatry

## 2022-11-06 DIAGNOSIS — M2042 Other hammer toe(s) (acquired), left foot: Secondary | ICD-10-CM

## 2022-11-08 NOTE — Progress Notes (Signed)
She presents today for surgical consult states that she would like to consider getting on the books for surgery sometime in May to June timeframe.  She states that she is we will have to work on getting her blood sugar down her last A1c was at 9.  Objective: Vital signs are stable alert and oriented x 3.  Pulses are palpable.  I reviewed her previous radiographs demonstrating moderate to severe hammertoe deformities left foot.  She also has a very prominent distal tuft to the hallux left.  Elongated second and third metatarsals.  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  Exostosis hallux left.  Plantarflexed elongated second metatarsal left foot.  Plan: Discussed etiology pathology conservative surgical therapies at this point highly recommended she get her blood sugar under better control if not under control by that time we will be unable to perform the procedures.  We did go ahead and consent her however for surgery in May to June.  This consent will be for second metatarsal osteotomy hammertoe repair with pins or screws toes #2 #3 #4 of the left foot.  A distal exostectomy to the hallux left.  We did discuss the possible postop complications which may include but not limited to postop pain bleeding swell infection recurrence need for further surgery overcorrection under correction loss of digit loss limb loss of life.

## 2022-11-17 ENCOUNTER — Other Ambulatory Visit: Payer: Self-pay

## 2022-11-17 ENCOUNTER — Encounter (HOSPITAL_BASED_OUTPATIENT_CLINIC_OR_DEPARTMENT_OTHER): Payer: Self-pay

## 2022-11-17 ENCOUNTER — Emergency Department (HOSPITAL_BASED_OUTPATIENT_CLINIC_OR_DEPARTMENT_OTHER): Payer: BC Managed Care – PPO

## 2022-11-17 ENCOUNTER — Other Ambulatory Visit (HOSPITAL_BASED_OUTPATIENT_CLINIC_OR_DEPARTMENT_OTHER): Payer: Self-pay

## 2022-11-17 ENCOUNTER — Emergency Department (HOSPITAL_BASED_OUTPATIENT_CLINIC_OR_DEPARTMENT_OTHER)
Admission: EM | Admit: 2022-11-17 | Discharge: 2022-11-17 | Disposition: A | Discharge status: Home / Self Care | Payer: BC Managed Care – PPO | Attending: Emergency Medicine | Admitting: Emergency Medicine

## 2022-11-17 DIAGNOSIS — R739 Hyperglycemia, unspecified: Secondary | ICD-10-CM | POA: Diagnosis present

## 2022-11-17 DIAGNOSIS — N3000 Acute cystitis without hematuria: Secondary | ICD-10-CM | POA: Insufficient documentation

## 2022-11-17 DIAGNOSIS — Z7984 Long term (current) use of oral hypoglycemic drugs: Secondary | ICD-10-CM | POA: Diagnosis not present

## 2022-11-17 DIAGNOSIS — E1165 Type 2 diabetes mellitus with hyperglycemia: Secondary | ICD-10-CM | POA: Diagnosis not present

## 2022-11-17 LAB — URINALYSIS, ROUTINE W REFLEX MICROSCOPIC
Bilirubin Urine: NEGATIVE
Glucose, UA: >1000 mg/dL — AB
Hgb urine dipstick: NEGATIVE
Ketones, ur: NEGATIVE mg/dL
Nitrite: POSITIVE — AB
Protein, ur: NEGATIVE mg/dL
Specific Gravity, Urine: 1.03 (ref 1.005–1.030)
pH: 6.5 (ref 5.0–8.0)

## 2022-11-17 LAB — BASIC METABOLIC PANEL
Anion gap: 13 (ref 5–15)
BUN: 14 mg/dL (ref 6–20)
CO2: 24 mmol/L (ref 22–32)
Calcium: 10 mg/dL (ref 8.9–10.3)
Chloride: 98 mmol/L (ref 98–111)
Creatinine, Ser: 0.77 mg/dL (ref 0.44–1.00)
GFR, Estimated: >60 mL/min (ref >60)
Glucose, Bld: 404 mg/dL — ABNORMAL HIGH (ref 70–99)
Potassium: 3.7 mmol/L (ref 3.5–5.1)
Sodium: 135 mmol/L (ref 135–145)

## 2022-11-17 LAB — CBC
HCT: 42.1 % (ref 36.0–46.0)
Hemoglobin: 14.7 g/dL (ref 12.0–15.0)
MCH: 30.6 pg (ref 26.0–34.0)
MCHC: 34.9 g/dL (ref 30.0–36.0)
MCV: 87.7 fL (ref 80.0–100.0)
Platelets: 200 10*3/uL (ref 150–400)
RBC: 4.8 MIL/uL (ref 3.87–5.11)
RDW: 12.5 % (ref 11.5–15.5)
WBC: 5.6 10*3/uL (ref 4.0–10.5)
nRBC: 0 % (ref 0.0–0.2)

## 2022-11-17 LAB — CBG MONITORING, ED
Glucose-Capillary: 374 mg/dL — ABNORMAL HIGH (ref 70–99)
Glucose-Capillary: 229 mg/dL — ABNORMAL HIGH (ref 70–99)
Glucose-Capillary: 214 mg/dL — ABNORMAL HIGH (ref 70–99)

## 2022-11-17 LAB — PREGNANCY, URINE: Preg Test, Ur: NEGATIVE

## 2022-11-17 MED ORDER — ONDANSETRON 4 MG PO TBDP
4.0000 mg | ORAL_TABLET | Freq: Three times a day (TID) | ORAL | 0 refills | Status: DC | PRN
  Filled 2022-11-17: qty 20, 7d supply, fill #0

## 2022-11-17 MED ORDER — PHENAZOPYRIDINE HCL 200 MG PO TABS: 200.0000 mg | ORAL_TABLET | Freq: Three times a day (TID) | ORAL | 0 refills | Status: AC

## 2022-11-17 MED ORDER — CEPHALEXIN 500 MG PO CAPS
500.0000 mg | ORAL_CAPSULE | Freq: Four times a day (QID) | ORAL | 0 refills | Status: DC
  Filled 2022-11-17: qty 28, 7d supply, fill #0

## 2022-11-17 MED ORDER — ONDANSETRON 4 MG PO TBDP: 4.0000 mg | ORAL_TABLET | Freq: Three times a day (TID) | ORAL | 0 refills | Status: AC | PRN

## 2022-11-17 MED ORDER — TECHLITE PEN NEEDLES 32G X 4 MM MISC: 100.0000 | Freq: Once | 0 refills | Status: AC

## 2022-11-17 MED ORDER — TECHLITE PEN NEEDLES 32G X 4 MM MISC
100.0000 | Freq: Once | 0 refills | Status: DC
  Filled 2022-11-17: qty 100, 90d supply, fill #0

## 2022-11-17 MED ORDER — CEPHALEXIN 500 MG PO CAPS: 500.0000 mg | ORAL_CAPSULE | Freq: Four times a day (QID) | ORAL | 0 refills | Status: AC

## 2022-11-17 MED ORDER — PHENAZOPYRIDINE HCL 200 MG PO TABS
200.0000 mg | ORAL_TABLET | Freq: Three times a day (TID) | ORAL | 0 refills | Status: DC
  Filled 2022-11-17: qty 6, 2d supply, fill #0

## 2022-11-17 MED ORDER — SULFAMETHOXAZOLE-TRIMETHOPRIM 800-160 MG PO TABS
1.0000 | ORAL_TABLET | Freq: Two times a day (BID) | ORAL | 0 refills | Status: DC
  Filled 2022-11-17: qty 20, 10d supply, fill #0

## 2022-11-17 MED ORDER — LACTATED RINGERS IV BOLUS
1000.0000 mL | Freq: Once | INTRAVENOUS | Status: AC
  Administered 2022-11-17: 1000 mL via INTRAVENOUS

## 2022-11-17 MED ORDER — SODIUM CHLORIDE 0.9 % IV SOLN
1.0000 g | Freq: Once | INTRAVENOUS | Status: AC
  Administered 2022-11-17: 1 g via INTRAVENOUS
  Filled 2022-11-17: qty 10

## 2022-11-17 NOTE — ED Provider Notes (Signed)
Patient had elevated blood sugar.  She also had dysuria frequency and low back pain.  Patient is getting hydration and plan for outpatient antibiotics.  Patient reports to me that she has gotten recurrent urinary tract infections and was concerned she might have a kidney stone because she has some more intense pain in the right lower abdomen at onset of this episode.  She reports she has taken Keflex in the past with good response for treatment.  She also reports she has had Rocephin with no allergic reaction.  With patient reporting recurrent UTI and sharp pain possibly kidney stone, will add CT stone study and treat 1 IV dose of Rocephin.   Physical Exam  BP (!) 158/83 (BP Location: Left Arm)   Pulse (!) 57   Temp 97.8 F (36.6 C)   Resp 18   Ht 5' 3"$  (1.6 m)   Wt 79.5 kg   LMP 08/10/2014   SpO2 100%   BMI 31.05 kg/m   Physical Exam  Procedures  Procedures  ED Course / MDM   Clinical Course as of 11/17/22 1746  Mon Nov 17, 2022  1320 Glucose(!): 404 [JL]  1320 CO2: 24 [JL]  1320 Anion gap: 13 [JL]  1320 Nitrite(!): POSITIVE [JL]  1320 Leukocytes,Ua(!): TRACE [JL]  1320 WBC, UA: 21-50 [JL]  1320 Bacteria, UA(!): MANY [JL]    Clinical Course User Index [JL] Regan Lemming, MD   Medical Decision Making Amount and/or Complexity of Data Reviewed Labs: ordered. Decision-making details documented in ED Course. Radiology: ordered.  Risk OTC drugs. Prescription drug management.   CT stone study negative for any retained stone.  His blood sugar has decreased significantly with hydration.  She is down to 214.  Patient has a grossly positive urinalysis.  She reports she has had Keflex in the past and also had Rocephin without allergic reaction.  Will give 1 dose of IV Rocephin for UTI with flank pain consistent with pyelonephritis.  Patient tolerated Rocephin well.  At this time stable for discharge.  Careful return precautions and follow-up plan reviewed.        Charlesetta Shanks, MD 11/17/22 1750

## 2022-11-17 NOTE — ED Notes (Signed)
Pt's CBG result was 214. Informed Dr. Johnney Killian and Hennie Duos - RN.

## 2022-11-17 NOTE — Discharge Instructions (Signed)
1.  You have been given a dose of Rocephin in the emergency department.  This antibiotic will be effective for 24 hours.  You may start your Keflex tomorrow evening. Prescribed Pyridium to take for burning and discomfort with urination.  Will turn your urine orange and if you wear contact lenses you may see orange staining as well. 2.  Start your Lantus this evening per your physicians prescription.  Continue to monitor your blood sugars closely. 3.  Return to emergency department if you have new worsening or concerning symptoms.

## 2022-11-17 NOTE — ED Triage Notes (Signed)
Patient here POV from Home.  Endorses Hyperglycemia. States it has been running high since yesterday. 420 yesterday and today it was 457.   Some Nausea. Some Polyuria and Polydipsia. Just takes Januvia, used at 0930.   NAD Noted during Triage. A&Ox4. GCS 15. Ambulatory.

## 2022-11-17 NOTE — ED Provider Notes (Signed)
[facility] Provider Note   CSN: [id number] Arrival date & time: 11/17/22  1222     History  Chief Complaint  Patient presents with   Hyperglycemia    [name] is a 57 y.o. female.   Hyperglycemia    57 year old female with history significant for diabetes mellitus, recently started on Lantus, previously managed by Januvia who presents emergency department with hyperglycemia.  The patient also states that she has had dysuria and low back pain.  She denies any fevers or chills.  She endorses polyuria and polydipsia.  She found out that her hemoglobin A1c was around 9 outpatient has been checking her blood sugars at home and they are normally slightly over 200.  She states that her blood sugars been running high over the 400s yesterday.  She takes Januvia.  Was going to start Lantus but had not required the needles for injections yet.  She denies any nausea or vomiting.  No hematuria.  Home Medications Prior to Admission medications   Medication Sig Start Date End Date Taking? Authorizing Provider  Acetaminophen (TYLENOL ARTHRITIS PAIN PO) Take 2 tablets by mouth 2 (two) times daily.    [provider]  albuterol (VENTOLIN HFA) 108 (90 Base) MCG/ACT inhaler Inhale into the lungs. 05/12/22   [provider]  amLODipine (NORVASC) 5 MG tablet TAKE 1 TABLET BY MOUTH DAILY. 04/03/17   Lucretia Kern, DO  baclofen (LIORESAL) 10 MG tablet TK 1 T PO TID 08/01/15   [provider]  blood glucose meter kit and supplies KIT Use to test blood sugar once a day 09/17/15   Lucretia Kern, DO  butalbital-acetaminophen-caffeine (FIORICET) [phone] MG tablet Take by mouth. 05/12/22   [provider]  cephALEXin (KEFLEX) 500 MG capsule Take 1 capsule (500 mg total) by mouth 4 (four) times daily. 11/17/22   Charlesetta Shanks, MD  dicyclomine (BENTYL) 10 MG capsule Take 1 tab at bedtime.or as directed 12/12/15   Panosh, Standley Brooking, MD   escitalopram (LEXAPRO) 5 MG tablet Take 1 tablet (5 mg total) by mouth daily. 02/12/17   Lucretia Kern, DO  ezetimibe (ZETIA) 10 MG tablet SMARTSIG:1 Tablet(s) By Mouth Every Evening 02/28/22   [provider]  Fenofibrate 40 MG TABS Take 1 tablet by mouth at bedtime.  09/22/19   [provider]  fenofibrate micronized (LOFIBRA) 67 MG capsule Take 67 mg by mouth daily. 07/30/20   [provider]  glimepiride (AMARYL) 4 MG tablet Take 1 tablet (4 mg total) by mouth daily before breakfast. 02/12/17   Colin Benton R, DO  glucose blood test strip USE UTD 09/09/17   [provider]  Insulin Pen Needle (TECHLITE PEN NEEDLES) 32G X 4 MM MISC Use as directed 11/17/22 11/17/22  Charlesetta Shanks, MD  Lactase (DAIRY DIGESTIVE PO) Take by mouth.    [provider]  levocetirizine (XYZAL) 5 MG tablet Take 5 mg by mouth every evening.    [provider]  loperamide (IMODIUM) 2 MG capsule Take by mouth as needed for diarrhea or loose stools.    [provider]  losartan-hydrochlorothiazide (HYZAAR) 100-25 MG tablet TAKE 1 TABLET BY MOUTH EVERY DAY 04/03/17   Colin Benton R, DO  MELATONIN PO Take by mouth.    [provider]  meloxicam (MOBIC) 15 MG tablet Take 1 tablet (15 mg total) by mouth daily. 07/28/18   Edrick Kins, DPM  montelukast (SINGULAIR) 10 MG tablet TAKE  1 TABLET BY MOUTH EVERY DAY 04/12/18   Lucretia Kern, DO  omeprazole-sodium bicarbonate (ZEGERID) 40-1100 MG per capsule Take 1 capsule by mouth daily. 03/16/15   Lucretia Kern, DO  ondansetron (ZOFRAN) 8 MG tablet Take by mouth. 05/12/22   [provider]  ondansetron (ZOFRAN-ODT) 4 MG disintegrating tablet Take 1 tablet (4 mg total) by mouth every 8 (eight) hours as needed for nausea or vomiting. 11/17/22   Charlesetta Shanks, MD  pantoprazole (PROTONIX) 40 MG tablet Take 40 mg by mouth daily. 10/03/19   [provider]  phenazopyridine (PYRIDIUM) 200 MG tablet Take 1  tablet (200 mg total) by mouth 3 (three) times daily. 11/17/22   Charlesetta Shanks, MD  phentermine 15 MG capsule Take by mouth. 03/19/20   [provider]  predniSONE (DELTASONE) 20 MG tablet Take 2 tablets daily with breakfast. 08/16/21   Jaynee Eagles, PA-C  pregabalin (LYRICA) 75 MG capsule Take 75 mg by mouth 2 (two) times daily. 08/10/20   [provider]  Probiotic Product (PROBIOTIC PO) Take by mouth daily.    [provider]  promethazine (PHENERGAN) 12.5 MG tablet Take by mouth. 06/11/20   [provider]  promethazine-dextromethorphan (PROMETHAZINE-DM) 6.25-15 MG/5ML syrup Take 5 mLs by mouth at bedtime as needed for cough. 08/16/21   Jaynee Eagles, PA-C  Psyllium (DAILY FIBER PO) Take by mouth.    [provider]  sitaGLIPtin (JANUVIA) 50 MG tablet Take 50 mg by mouth daily.    [provider]  sucralfate (CARAFATE) 1 g tablet Take 1 g by mouth 3 (three) times daily. 03/21/22   [provider]  SYMBICORT 160-4.5 MCG/ACT inhaler SMARTSIG:2 Puff(s) By Mouth Twice Daily 06/05/20   [provider]  topiramate (TOPAMAX) 50 MG tablet Take 50 mg by mouth 2 (two) times daily. 08/25/20   [provider]  trolamine salicylate (ASPERCREME) 10 % cream Apply 1 application topically as needed for muscle pain.    [provider]  VASCEPA 1 g capsule Take 2 g by mouth 2 (two) times daily. 10/24/19   [provider]  Vitamin D, Ergocalciferol, (DRISDOL) 1.25 MG (50000 UNIT) CAPS capsule Take 50,000 Units by mouth once a week. 09/21/19   [provider]      Allergies    Metformin and related, Ozempic (0.25 or 0.5 mg-dose) [semaglutide(0.25 or 0.44m-dos)], Penicillins, and Tizanidine    Review of Systems   Review of Systems  All other systems reviewed and are negative.   Physical Exam Updated Vital Signs BP (!) 158/83 (BP Location: Left Arm)   Pulse (!) 57   Temp 97.8 F (36.6 C)   Resp 18   Ht 5'  3" (1.6 m)   Wt 79.5 kg   LMP 08/10/2014   SpO2 100%   BMI 31.05 kg/m  Physical Exam Vitals and nursing note reviewed.  Constitutional:      General: She is not in acute distress.    Appearance: She is well-developed.  HENT:     Head: Normocephalic and atraumatic.  Eyes:     Conjunctiva/sclera: Conjunctivae normal.  Cardiovascular:     Rate and Rhythm: Normal rate and regular rhythm.  Pulmonary:     Effort: Pulmonary effort is normal. No respiratory distress.     Breath sounds: Normal breath sounds.  Abdominal:     Palpations: Abdomen is soft.     Tenderness: There is no abdominal tenderness. There is left CVA tenderness.  Musculoskeletal:  General: No swelling.     Cervical back: Neck supple.  Skin:    General: Skin is warm and dry.     Capillary Refill: Capillary refill takes less than 2 seconds.  Neurological:     Mental Status: She is alert.  Psychiatric:        Mood and Affect: Mood normal.     ED Results / Procedures / Treatments   Labs (all labs ordered are listed, but only abnormal results are displayed) Labs Reviewed  BASIC METABOLIC PANEL - Abnormal; Notable for the following components:      Result Value   Glucose, Bld 404 (*)    All other components within normal limits  URINALYSIS, ROUTINE W REFLEX MICROSCOPIC - Abnormal; Notable for the following components:   Glucose, UA >1,000 (*)    Nitrite POSITIVE (*)    Leukocytes,Ua TRACE (*)    Bacteria, UA MANY (*)    All other components within normal limits  CBG MONITORING, ED - Abnormal; Notable for the following components:   Glucose-Capillary 374 (*)    All other components within normal limits  CBG MONITORING, ED - Abnormal; Notable for the following components:   Glucose-Capillary 229 (*)    All other components within normal limits  CBG MONITORING, ED - Abnormal; Notable for the following components:   Glucose-Capillary 214 (*)    All other components within normal limits  URINE CULTURE   CBC  PREGNANCY, URINE  CBG MONITORING, ED    EKG None  Radiology CT Renal Stone Study  Result Date: 11/17/2022 CLINICAL DATA:  Hyperglycemia. Polyuria. Abdominal pain, evaluate for renal stone. EXAM: CT ABDOMEN AND PELVIS WITHOUT CONTRAST TECHNIQUE: Multidetector CT imaging of the abdomen and pelvis was performed following the standard protocol without IV contrast. RADIATION DOSE REDUCTION: This exam was performed according to the departmental dose-optimization program which includes automated exposure control, adjustment of the mA and/or kV according to patient size and/or use of iterative reconstruction technique. COMPARISON:  CT abdomen pelvis 01/23/2015 FINDINGS: Lower chest: No acute abnormality. Evaluation of the abdominal viscera somewhat limited by the lack of IV contrast. Hepatobiliary: No focal liver abnormality is seen. Normal gallbladder. Pancreas: Unremarkable. No surrounding inflammatory changes. Spleen: Normal in size without focal abnormality. Adrenals/Urinary Tract: Adrenal glands are unremarkable. Kidneys are normal, without renal calculi, focal lesion, or hydronephrosis. Bladder is unremarkable. Stomach/Bowel: Stomach is within normal limits. Appendix appears normal. No evidence of bowel wall thickening, distention, or inflammatory changes. Vascular/Lymphatic: Aortic atherosclerosis. No enlarged abdominal or pelvic lymph nodes. Reproductive: Uterus and bilateral adnexa are unremarkable. Other: Small fat containing umbilical hernia. No abdominopelvic ascites. Musculoskeletal: Orthopedic hardware in the lower lumbar spine. No acute osseous finding. IMPRESSION: No acute finding in the abdomen or pelvis on a noncontrast exam. No evidence of nephrolithiasis or hydronephrosis. Aortic Atherosclerosis (ICD10-I70.0). Electronically Signed   By: Audie Pinto M.D.   On: 11/17/2022 17:38    Procedures Procedures    Medications Ordered in ED Medications  lactated ringers bolus 1,000  mL (1,000 mLs Intravenous New Bag/Given 11/17/22 1516)  cefTRIAXone (ROCEPHIN) 1 g in sodium chloride 0.9 % 100 mL IVPB (1 g Intravenous New Bag/Given 11/17/22 1708)    ED Course/ Medical Decision Making/ A&P Clinical Course as of 11/17/22 1803  Mon Nov 17, 2022  1320 Glucose(!): 404 [JL]  1320 CO2: 24 [JL]  1320 Anion gap: 13 [JL]  1320 Nitrite(!): POSITIVE [JL]  1320 Leukocytes,Ua(!): TRACE [JL]  1320 WBC, UA: 21-50 [JL]  1320 Bacteria,  UA(!): MANY [JL]    Clinical Course User Index [JL] Regan Lemming, MD                             Medical Decision Making Amount and/or Complexity of Data Reviewed Labs: ordered. Decision-making details documented in ED Course. Radiology: ordered.  Risk OTC drugs. Prescription drug management.    57 year old female with history significant for diabetes mellitus, recently started on Lantus, previously managed by Januvia who presents emergency department with hyperglycemia.  The patient also states that she has had dysuria and low back pain.  She denies any fevers or chills.  She endorses polyuria and polydipsia.  She found out that her hemoglobin A1c was around 9 outpatient has been checking her blood sugars at home and they are normally slightly over 200.  She states that her blood sugars been running high over the 400s yesterday.  She takes Januvia.  Was going to start Lantus but had not required the needles for injections yet.  She denies any nausea or vomiting.  No hematuria.  On arrival, the patient was afebrile, not tachycardic or tachypneic, saturating well on room air.  Physical exam significant for left-sided CVA tenderness.  Concern for UTI and developing pyelonephritis.  Patient vitally reassuring.  Initial CBG revealed hyperglycemia with a blood glucose of 404.  Her BMP revealed a normal bicarbonate and anion gap with no evidence for DKA.  Low concern for HHS.  Urinalysis was collected and revealed no hematuria, evidence for UTI.  Low  concern for nephrolithiasis given lack of hematuria, given the patient's genitourinary symptoms, developing flank pain, suspect early pyelonephritis although patient is vitally stable at this time.  Will treat for pyelonephritis outpatient.  Will also prescribe needles for the patient's Lantus injections.  At time of signout, the patient was halfway through her 1 L IV fluid bolus with a plan to recheck her blood glucose to ensure improvement.  Signout given to Dr. Johnney Killian to reassess the patient pending repeat CBG following fluids.  Suspect likely plan for discharge home.   Final Clinical Impression(s) / ED Diagnoses Final diagnoses:  Hyperglycemia  Acute cystitis without hematuria    Rx / DC Orders ED Discharge Orders          Ordered    Insulin Pen Needle (TECHLITE PEN NEEDLES) 32G X 4 MM MISC   Once,   Status:  Discontinued        11/17/22 1600    sulfamethoxazole-trimethoprim (BACTRIM DS) 800-160 MG tablet  2 times daily,   Status:  Discontinued        11/17/22 1600    cephALEXin (KEFLEX) 500 MG capsule  4 times daily,   Status:  Discontinued        11/17/22 1745    phenazopyridine (PYRIDIUM) 200 MG tablet  3 times daily,   Status:  Discontinued        11/17/22 1745    ondansetron (ZOFRAN-ODT) 4 MG disintegrating tablet  Every 8 hours PRN,   Status:  Discontinued        11/17/22 1745    cephALEXin (KEFLEX) 500 MG capsule  4 times daily        11/17/22 1759    Insulin Pen Needle (TECHLITE PEN NEEDLES) 32G X 4 MM MISC   Once        11/17/22 1759    ondansetron (ZOFRAN-ODT) 4 MG disintegrating tablet  Every 8 hours PRN  11/17/22 1759    phenazopyridine (PYRIDIUM) 200 MG tablet  3 times daily        11/17/22 1759              Regan Lemming, MD 11/17/22 [phone]

## 2022-11-18 ENCOUNTER — Other Ambulatory Visit (HOSPITAL_BASED_OUTPATIENT_CLINIC_OR_DEPARTMENT_OTHER): Payer: Self-pay

## 2022-11-19 LAB — URINE CULTURE: Culture: >=100000 — AB

## 2022-11-20 ENCOUNTER — Telehealth (HOSPITAL_BASED_OUTPATIENT_CLINIC_OR_DEPARTMENT_OTHER): Payer: Self-pay | Admitting: *Deleted

## 2022-11-20 NOTE — Telephone Encounter (Signed)
Post ED Visit - Positive Culture Follow-up  Culture report reviewed by antimicrobial stewardship pharmacist: [place] Team []$  Elenor Quinones, Pharm.D. []$  Heide Guile, Pharm.D., BCPS AQ-ID []$  Parks Neptune, Pharm.D., BCPS []$  Alycia Rossetti, Pharm.D., BCPS []$  [place], [place].D., BCPS, AAHIVP []$  Legrand Como, Pharm.D., BCPS, AAHIVP []$  Salome Arnt, PharmD, BCPS []$  Johnnette Gourd, PharmD, BCPS []$  Hughes Better, PharmD, BCPS []$  Leeroy Cha, PharmD []$  Laqueta Linden, PharmD, BCPS []$  Albertina Parr, PharmD  [place] Team []$  Leodis Sias, PharmD []$  Lindell Spar, PharmD []$  Royetta Asal, PharmD []$  Graylin Shiver, Rph []$  Rema Fendt) Glennon Mac, PharmD []$  Arlyn Dunning, PharmD []$  Netta Cedars, PharmD []$  Dia Sitter, PharmD []$  Leone Haven, PharmD []$  Gretta Arab, PharmD []$  Theodis Shove, PharmD []$  Peggyann Juba, PharmD []$  Reuel Boom, PharmD   Positive urine culture Treated with Cephalexin, organism sensitive to the same and no further patient follow-up is required at this time. Arturo Morton, PharmD  Harlon Flor Talley 11/20/2022, 10:57 AM

## 2022-12-02 ENCOUNTER — Emergency Department (HOSPITAL_BASED_OUTPATIENT_CLINIC_OR_DEPARTMENT_OTHER)
Admission: EM | Admit: 2022-12-02 | Discharge: 2022-12-02 | Disposition: A | Discharge status: Home / Self Care | Payer: BC Managed Care – PPO | Attending: Emergency Medicine | Admitting: Emergency Medicine

## 2022-12-02 ENCOUNTER — Other Ambulatory Visit: Payer: Self-pay

## 2022-12-02 ENCOUNTER — Encounter (HOSPITAL_BASED_OUTPATIENT_CLINIC_OR_DEPARTMENT_OTHER): Payer: Self-pay

## 2022-12-02 ENCOUNTER — Emergency Department (HOSPITAL_BASED_OUTPATIENT_CLINIC_OR_DEPARTMENT_OTHER): Payer: BC Managed Care – PPO | Admitting: Radiology

## 2022-12-02 DIAGNOSIS — I10 Essential (primary) hypertension: Secondary | ICD-10-CM | POA: Insufficient documentation

## 2022-12-02 DIAGNOSIS — Z79899 Other long term (current) drug therapy: Secondary | ICD-10-CM | POA: Insufficient documentation

## 2022-12-02 DIAGNOSIS — R079 Chest pain, unspecified: Secondary | ICD-10-CM | POA: Diagnosis present

## 2022-12-02 DIAGNOSIS — E119 Type 2 diabetes mellitus without complications: Secondary | ICD-10-CM | POA: Diagnosis not present

## 2022-12-02 DIAGNOSIS — Z7984 Long term (current) use of oral hypoglycemic drugs: Secondary | ICD-10-CM | POA: Diagnosis not present

## 2022-12-02 DIAGNOSIS — M25511 Pain in right shoulder: Secondary | ICD-10-CM | POA: Insufficient documentation

## 2022-12-02 LAB — CBC
HCT: 43.6 % (ref 36.0–46.0)
Hemoglobin: 15.2 g/dL — ABNORMAL HIGH (ref 12.0–15.0)
MCH: 30.6 pg (ref 26.0–34.0)
MCHC: 34.9 g/dL (ref 30.0–36.0)
MCV: 87.7 fL (ref 80.0–100.0)
Platelets: 208 10*3/uL (ref 150–400)
RBC: 4.97 MIL/uL (ref 3.87–5.11)
RDW: 12.7 % (ref 11.5–15.5)
WBC: 6.6 10*3/uL (ref 4.0–10.5)
nRBC: 0 % (ref 0.0–0.2)

## 2022-12-02 LAB — BASIC METABOLIC PANEL
Anion gap: 12 (ref 5–15)
BUN: 13 mg/dL (ref 6–20)
CO2: 23 mmol/L (ref 22–32)
Calcium: 9.6 mg/dL (ref 8.9–10.3)
Chloride: 103 mmol/L (ref 98–111)
Creatinine, Ser: 0.75 mg/dL (ref 0.44–1.00)
GFR, Estimated: >60 mL/min (ref >60)
Glucose, Bld: 300 mg/dL — ABNORMAL HIGH (ref 70–99)
Potassium: 3.6 mmol/L (ref 3.5–5.1)
Sodium: 138 mmol/L (ref 135–145)

## 2022-12-02 LAB — TROPONIN I (HIGH SENSITIVITY)
Troponin I (High Sensitivity): 11 ng/L (ref <18)
Troponin I (High Sensitivity): 15 ng/L (ref <18)

## 2022-12-02 MED ORDER — CYCLOBENZAPRINE HCL 5 MG PO TABS: 5.0000 mg | ORAL_TABLET | Freq: Two times a day (BID) | ORAL | 0 refills | Status: AC | PRN

## 2022-12-02 MED ORDER — ALUM & MAG HYDROXIDE-SIMETH 200-200-20 MG/5ML PO SUSP
30.0000 mL | Freq: Once | ORAL | Status: AC
  Administered 2022-12-02: 30 mL via ORAL
  Filled 2022-12-02: qty 30

## 2022-12-02 MED ORDER — LIDOCAINE 5 % EX PTCH
1.0000 | MEDICATED_PATCH | CUTANEOUS | Status: DC
  Administered 2022-12-02: 1 via TRANSDERMAL
  Filled 2022-12-02: qty 1

## 2022-12-02 MED ORDER — LIDOCAINE 5 % EX PTCH: 1.0000 | MEDICATED_PATCH | CUTANEOUS | 0 refills | Status: AC

## 2022-12-02 NOTE — Discharge Instructions (Addendum)
Return to the ED with any new or worsening signs or symptoms such as increased chest pain or shortness of breath Please purchase something such as Maalox or Mylanta for your suspected GERD Please read attached guide concerning shoulder pain and nonspecific chest pain in adults Please utilize lidocaine patches sent in.  Please utilize muscle relaxers then.  Please do not drive or operate heavy machinery under the influence of Flexeril as it will cause you to become drowsy and sedated. Please follow-up with your PCP regarding your pain and chest and shoulder pain

## 2022-12-02 NOTE — ED Notes (Signed)
Pt verbalized understanding of d/c instructions, meds, and followup care. Denies questions. VSS, no distress noted. Steady gait to exit with all belongings. 

## 2022-12-02 NOTE — ED Triage Notes (Signed)
Patient here POV from Home.  Endorses CP that began over the Weekend. Began to have Right Arm Pain on Friday. Worsened since it began. Mostly Center Upper Chest and Heartburn-Like.   Some SOB from the Pain.   NAD Noted during Triage. A&Ox4. GCS 15. Ambulatory.

## 2022-12-02 NOTE — ED Provider Notes (Signed)
[facility] Provider Note   CSN: [id number] Arrival date & time: 12/02/22  1302     History  Chief Complaint  Patient presents with   Chest Pain    [name] is a 57 y.o. female with medical history of GERD, hypertension, arthritis, diabetes, IBS, low back pain.  Patient presents to ED for evaluation of chest pain and shortness of breath.  Patient reports that beginning on Friday she developed right shoulder pain which she attributes to a fall that occurred 2 weeks ago.  Patient reports she was never seen for this fall.  The patient states that over the weekend her shoulder pain developed into chest pain on Saturday.  Patient also describes this as chest tightness.  Patient states that beginning today on Tuesday she developed shortness of breath.  Patient states that chest tightness will radiate into her shoulder on the right.  Patient unsure of worsening with exertion.  Patient denies any nausea, vomiting, abdominal pain, lightheadedness, dizziness, weakness, syncope, leg swelling, fevers.  Patient reports that she believes her symptoms are secondary to GERD however she reports that she has been compliant on her omeprazole.     Chest Pain Associated symptoms: shortness of breath   Associated symptoms: no abdominal pain, no dizziness, no nausea, no vomiting and no weakness        Home Medications Prior to Admission medications   Medication Sig Start Date End Date Taking? Authorizing Provider  cyclobenzaprine (FLEXERIL) 5 MG tablet Take 1 tablet (5 mg total) by mouth 2 (two) times daily as needed for muscle spasms. 12/02/22  Yes Genevive Bi F, PA-C  lidocaine (LIDODERM) 5 % Place 1 patch onto the skin daily. Remove & Discard patch within 12 hours or as directed by MD 12/02/22  Yes Azucena Cecil, PA-C  Acetaminophen (TYLENOL ARTHRITIS PAIN PO) Take 2 tablets by mouth 2 (two) times daily.    [provider]  albuterol  (VENTOLIN HFA) 108 (90 Base) MCG/ACT inhaler Inhale into the lungs. 05/12/22   [provider]  amLODipine (NORVASC) 5 MG tablet TAKE 1 TABLET BY MOUTH DAILY. 04/03/17   Lucretia Kern, DO  baclofen (LIORESAL) 10 MG tablet TK 1 T PO TID 08/01/15   [provider]  blood glucose meter kit and supplies KIT Use to test blood sugar once a day 09/17/15   Lucretia Kern, DO  butalbital-acetaminophen-caffeine (FIORICET) [phone] MG tablet Take by mouth. 05/12/22   [provider]  cephALEXin (KEFLEX) 500 MG capsule Take 1 capsule (500 mg total) by mouth 4 (four) times daily. 11/17/22   Charlesetta Shanks, MD  dicyclomine (BENTYL) 10 MG capsule Take 1 tab at bedtime.or as directed 12/12/15   Panosh, Standley Brooking, MD  escitalopram (LEXAPRO) 5 MG tablet Take 1 tablet (5 mg total) by mouth daily. 02/12/17   Lucretia Kern, DO  ezetimibe (ZETIA) 10 MG tablet SMARTSIG:1 Tablet(s) By Mouth Every Evening 02/28/22   [provider]  Fenofibrate 40 MG TABS Take 1 tablet by mouth at bedtime.  09/22/19   [provider]  fenofibrate micronized (LOFIBRA) 67 MG capsule Take 67 mg by mouth daily. 07/30/20   [provider]  glimepiride (AMARYL) 4 MG tablet Take 1 tablet (4 mg total) by mouth daily before breakfast. 02/12/17   Colin Benton R, DO  glucose blood test strip USE UTD 09/09/17   [provider]  Lactase (DAIRY DIGESTIVE PO) Take by mouth.  [provider]  levocetirizine (XYZAL) 5 MG tablet Take 5 mg by mouth every evening.    [provider]  loperamide (IMODIUM) 2 MG capsule Take by mouth as needed for diarrhea or loose stools.    [provider]  losartan-hydrochlorothiazide (HYZAAR) 100-25 MG tablet TAKE 1 TABLET BY MOUTH EVERY DAY 04/03/17   Colin Benton R, DO  MELATONIN PO Take by mouth.    [provider]  meloxicam (MOBIC) 15 MG tablet Take 1 tablet (15 mg total) by mouth daily. 07/28/18   Edrick Kins, DPM  montelukast  (SINGULAIR) 10 MG tablet TAKE 1 TABLET BY MOUTH EVERY DAY 04/12/18   Lucretia Kern, DO  omeprazole-sodium bicarbonate (ZEGERID) 40-1100 MG per capsule Take 1 capsule by mouth daily. 03/16/15   Lucretia Kern, DO  ondansetron (ZOFRAN) 8 MG tablet Take by mouth. 05/12/22   [provider]  ondansetron (ZOFRAN-ODT) 4 MG disintegrating tablet Take 1 tablet (4 mg total) by mouth every 8 (eight) hours as needed for nausea or vomiting. 11/17/22   Charlesetta Shanks, MD  pantoprazole (PROTONIX) 40 MG tablet Take 40 mg by mouth daily. 10/03/19   [provider]  phenazopyridine (PYRIDIUM) 200 MG tablet Take 1 tablet (200 mg total) by mouth 3 (three) times daily. 11/17/22   Charlesetta Shanks, MD  phentermine 15 MG capsule Take by mouth. 03/19/20   [provider]  predniSONE (DELTASONE) 20 MG tablet Take 2 tablets daily with breakfast. 08/16/21   Jaynee Eagles, PA-C  pregabalin (LYRICA) 75 MG capsule Take 75 mg by mouth 2 (two) times daily. 08/10/20   [provider]  Probiotic Product (PROBIOTIC PO) Take by mouth daily.    [provider]  promethazine (PHENERGAN) 12.5 MG tablet Take by mouth. 06/11/20   [provider]  promethazine-dextromethorphan (PROMETHAZINE-DM) 6.25-15 MG/5ML syrup Take 5 mLs by mouth at bedtime as needed for cough. 08/16/21   Jaynee Eagles, PA-C  Psyllium (DAILY FIBER PO) Take by mouth.    [provider]  sitaGLIPtin (JANUVIA) 50 MG tablet Take 50 mg by mouth daily.    [provider]  sucralfate (CARAFATE) 1 g tablet Take 1 g by mouth 3 (three) times daily. 03/21/22   [provider]  SYMBICORT 160-4.5 MCG/ACT inhaler SMARTSIG:2 Puff(s) By Mouth Twice Daily 06/05/20   [provider]  topiramate (TOPAMAX) 50 MG tablet Take 50 mg by mouth 2 (two) times daily. 08/25/20   [provider]  trolamine salicylate (ASPERCREME) 10 % cream Apply 1 application topically as needed for muscle pain.    [provider]  VASCEPA 1 g capsule Take 2 g by mouth 2 (two) times daily. 10/24/19   [provider]  Vitamin D, Ergocalciferol, (DRISDOL) 1.25 MG (50000 UNIT) CAPS capsule Take 50,000 Units by mouth once a week. 09/21/19   [provider]      Allergies    Metformin and related, Ozempic (0.25 or 0.5 mg-dose) [semaglutide(0.25 or 0.'5mg'$ -dos)], Penicillins, and Tizanidine    Review of Systems   Review of Systems  Respiratory:  Positive for chest tightness and shortness of breath.   Cardiovascular:  Positive for chest pain. Negative for leg swelling.  Gastrointestinal:  Negative for abdominal pain, nausea and vomiting.  Musculoskeletal:  Positive for arthralgias.  Neurological:  Negative for dizziness, syncope, weakness and light-headedness.  All other systems reviewed and are negative.   Physical Exam Updated Vital Signs BP (!) 162/75 (BP Location: Right Arm)  Pulse 61   Temp 98.3 F (36.8 C) (Oral)   Resp 18   Ht '5\' 3"'$  (1.6 m)   Wt 79.5 kg   LMP 08/10/2014   SpO2 98%   BMI 31.05 kg/m  Physical Exam Vitals and nursing note reviewed.  Constitutional:      General: She is not in acute distress.    Appearance: Normal appearance. She is not ill-appearing, toxic-appearing or diaphoretic.  HENT:     Head: Normocephalic and atraumatic.     Nose: Nose normal.     Mouth/Throat:     Mouth: Mucous membranes are moist.     Pharynx: Oropharynx is clear.  Eyes:     Extraocular Movements: Extraocular movements intact.     Conjunctiva/sclera: Conjunctivae normal.     Pupils: Pupils are equal, round, and reactive to light.  Cardiovascular:     Rate and Rhythm: Normal rate and regular rhythm.  Pulmonary:     Effort: Pulmonary effort is normal.     Breath sounds: Normal breath sounds. No wheezing.  Abdominal:     General: Abdomen is flat. Bowel sounds are normal.     Palpations: Abdomen is soft.     Tenderness: There is no abdominal tenderness.  Musculoskeletal:      Cervical back: Normal range of motion and neck supple. No tenderness.  Skin:    General: Skin is warm and dry.     Capillary Refill: Capillary refill takes less than 2 seconds.  Neurological:     Mental Status: She is alert and oriented to person, place, and time.     ED Results / Procedures / Treatments   Labs (all labs ordered are listed, but only abnormal results are displayed) Labs Reviewed  BASIC METABOLIC PANEL - Abnormal; Notable for the following components:      Result Value   Glucose, Bld 300 (*)    All other components within normal limits  CBC - Abnormal; Notable for the following components:   Hemoglobin 15.2 (*)    All other components within normal limits  TROPONIN I (HIGH SENSITIVITY)  TROPONIN I (HIGH SENSITIVITY)    EKG EKG Interpretation  Date/Time:  Tuesday December 02 2022 13:07:57 EST Ventricular Rate:  89 PR Interval:  132 QRS Duration: 80 QT Interval:  336 QTC Calculation: 408 R Axis:   80 Text Interpretation: Normal sinus rhythm Cannot rule out Anterior infarct , age undetermined Abnormal ECG When compared with ECG of 15-Jun-2020 11:36, PREVIOUS ECG IS PRESENT Confirmed by Regan Lemming ([id number]) on 12/02/2022 1:12:06 PM  Radiology DG Shoulder Right  Result Date: 12/02/2022 CLINICAL DATA:  Right shoulder pain after fall. EXAM: RIGHT SHOULDER - 2+ VIEW COMPARISON:  None Available. FINDINGS: There is no evidence of fracture or dislocation. There is no evidence of arthropathy or other focal bone abnormality. Soft tissues are unremarkable. IMPRESSION: Negative. Electronically Signed   By: Marijo Conception M.D.   On: 12/02/2022 15:12   DG Chest 2 View  Result Date: 12/02/2022 CLINICAL DATA:  Chest pain. EXAM: CHEST - 2 VIEW COMPARISON:  Chest x-ray August 16, 2021. FINDINGS: Low lung volumes. No consolidation. No visible pleural effusions or pneumothorax. Cardiomediastinal silhouette is unchanged. Kyphosis and scoliosis. Partially imaged lumbar fusion  hardware. IMPRESSION: No active cardiopulmonary disease. Electronically Signed   By: Margaretha Sheffield M.D.   On: 12/02/2022 13:53    Procedures Procedures    Medications Ordered in ED Medications  lidocaine (LIDODERM) 5 % 1 patch (1 patch Transdermal Patch  Applied 12/02/22 1448)  alum & mag hydroxide-simeth (MAALOX/MYLANTA) 200-200-20 MG/5ML suspension 30 mL (30 mLs Oral Given 12/02/22 1450)    ED Course/ Medical Decision Making/ A&P  Medical Decision Making Amount and/or Complexity of Data Reviewed Labs: ordered. Radiology: ordered.  Risk OTC drugs. Prescription drug management.   57 year old female presents to ED for evaluation.  Please see HPI for further details.  On examination the patient is afebrile and nontachycardic.  Lung sounds are clear bilaterally, she is not hypoxic on room air.  Abdomen soft and compressible throughout.  No peripheral edema.  Normal neurological examination.  The patient is nontoxic in appearance.  Patient workup includes CBC, BMP, troponin x 2, chest x-ray, EKG, right shoulder x-ray.  CBC unremarkable without leukocytosis or anemia.  BMP with elevated glucose to 300, patient reports that she has been having issues controlling her blood sugar recently and is set to see endocrinology.  Patient troponin initially 11, delta 15.  Patient chest x-ray shows no consolidations or effusions.  Patient plain film imaging of right shoulder shows no acute fractures or dislocations.  Patient EKG is nonischemic.  Patient provided GI cocktail and reports that her chest pain/abdominal discomfort has decreased.  The patient is also reporting that after lidocaine patch was administered her right shoulder pain is decreasing.  At this time, patient chest tightness could be attributed to GERD.  Patient advised to start taking Maalox/Mylanta. Patient also reports that her right shoulder pain will sometimes transition down into her chest and she is unsure if maybe her shoulder  pain is causing her chest pain as well.   The patient will be sent home with Lidoderm patches for her right shoulder pain, muscle relaxers and advised to continue taking Tylenol for pain.  The patient will be advised to follow-up with her PCP for further management of her right shoulder pain as well as her GERD.  Patient agreeable to plan.  Patient had the opportunity to ask all questions to her satisfaction.  Patient given return precautions and she voiced understanding.  Patient stable for discharge.   Final Clinical Impression(s) / ED Diagnoses Final diagnoses:  Acute pain of right shoulder  Chest pain, unspecified type    Rx / DC Orders ED Discharge Orders          Ordered    cyclobenzaprine (FLEXERIL) 5 MG tablet  2 times daily PRN        12/02/22 1752    lidocaine (LIDODERM) 5 %  Every 24 hours        12/02/22 1752              Azucena Cecil, PA-C 12/02/22 1754    Regan Lemming, MD 12/02/22 2115

## 2023-02-12 ENCOUNTER — Ambulatory Visit: Payer: BC Managed Care – PPO | Admitting: Podiatry

## 2023-02-12 DIAGNOSIS — M2042 Other hammer toe(s) (acquired), left foot: Secondary | ICD-10-CM | POA: Diagnosis not present

## 2023-02-12 DIAGNOSIS — M2041 Other hammer toe(s) (acquired), right foot: Secondary | ICD-10-CM

## 2023-02-12 DIAGNOSIS — E1169 Type 2 diabetes mellitus with other specified complication: Secondary | ICD-10-CM

## 2023-02-13 NOTE — Progress Notes (Signed)
She presents today to discuss surgery once again regarding her left foot and to get blood work for her hemoglobin A1c.  Objective: Vital signs stable she alert oriented x 3 states is really no change in the foot.  She still has pain on palpation of the second metatarsal phalangeal joint and the lesser toes as well as the tip of the hallux.  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  Plan: Discussed etiology pathology and surgical therapies at this point we are going to request a CMP and a hemoglobin A1c once this returns as normal we will go ahead and confirm her surgery for June.

## 2023-02-17 ENCOUNTER — Other Ambulatory Visit: Payer: Self-pay | Admitting: Podiatry

## 2023-02-18 LAB — COMPREHENSIVE METABOLIC PANEL
ALT: 38 IU/L — ABNORMAL HIGH (ref 0–32)
AST: 26 IU/L (ref 0–40)
Albumin/Globulin Ratio: 2.5 — ABNORMAL HIGH (ref 1.2–2.2)
Albumin: 4.8 g/dL (ref 3.8–4.9)
Alkaline Phosphatase: 86 IU/L (ref 44–121)
BUN/Creatinine Ratio: 21 (ref 9–23)
BUN: 15 mg/dL (ref 6–24)
Bilirubin Total: 0.2 mg/dL (ref 0.0–1.2)
CO2: 28 mmol/L (ref 20–29)
Calcium: 9.8 mg/dL (ref 8.7–10.2)
Chloride: 99 mmol/L (ref 96–106)
Creatinine, Ser: 0.72 mg/dL (ref 0.57–1.00)
Globulin, Total: 1.9 g/dL (ref 1.5–4.5)
Glucose: 126 mg/dL — ABNORMAL HIGH (ref 70–99)
Potassium: 4 mmol/L (ref 3.5–5.2)
Sodium: 140 mmol/L (ref 134–144)
Total Protein: 6.7 g/dL (ref 6.0–8.5)
eGFR: 98 mL/min/{1.73_m2} (ref >59)

## 2023-02-19 ENCOUNTER — Telehealth: Payer: Self-pay | Admitting: Urology

## 2023-02-19 NOTE — Telephone Encounter (Signed)
DOS - 03/20/23  METATARSAL OSTEOTOMY 2ND LEFT --- 28413 HAMMERTOE REPAIR 2-4 LEFT --- 24401 EXOSTECTOMY 1ST LEFT --- 02725  [employer] EFFECTIVE DATE - 10/06/22  DEDUCTIBLE - $1,250.00 W/ $0.00 REMAINING OOP - $4,890.00 W/ $599.48 REMAINING COINSURANCE - 20%  SPOKE WITH MONIQUE WITH [employer] AND SHE STATED THAT FOR CPT CODES 36644, 03474 AND 28108 NO PRIOR AUTH IS REQUIRED.  CALL  REF # MONIQUE C. 02/19/23 AT 10:23 AM EST

## 2023-02-24 ENCOUNTER — Telehealth: Payer: Self-pay | Admitting: *Deleted

## 2023-02-24 NOTE — Telephone Encounter (Deleted)
-----   Message from Ashley E Prevette, PMAC sent at 02/24/2023  8:10 AM EDT -----  ----- Message ----- From: Hyatt, Max T, DPM Sent: 02/24/2023   7:33 AM EDT To: Ashley E Prevette, PMAC  Blood work looks ok.    

## 2023-02-24 NOTE — Telephone Encounter (Signed)
-----   Message from Kristian Covey, [facility] sent at 02/24/2023  8:10 AM EDT -----  ----- Message ----- From: Elinor Parkinson, DPM Sent: 02/24/2023   7:33 AM EDT To: [employer]vette, PMAC  Blood work looks ok.

## 2023-02-25 NOTE — Telephone Encounter (Signed)
Patient has been updated.

## 2023-02-27 ENCOUNTER — Telehealth: Payer: Self-pay | Admitting: *Deleted

## 2023-02-27 NOTE — Telephone Encounter (Signed)
-----   Message from Ashley E Prevette, PMAC sent at 02/24/2023  8:10 AM EDT -----  ----- Message ----- From: Hyatt, Max T, DPM Sent: 02/24/2023   7:33 AM EDT To: Ashley E Prevette, PMAC  Blood work looks ok.    

## 2023-02-27 NOTE — Telephone Encounter (Signed)
Patient has been updated.

## 2023-03-19 ENCOUNTER — Other Ambulatory Visit: Payer: Self-pay | Admitting: Podiatry

## 2023-03-19 MED ORDER — CLINDAMYCIN HCL 150 MG PO CAPS: 150.0000 mg | ORAL_CAPSULE | Freq: Three times a day (TID) | ORAL | 0 refills | Status: AC

## 2023-03-19 MED ORDER — OXYCODONE-ACETAMINOPHEN 10-325 MG PO TABS: 1.0000 | ORAL_TABLET | Freq: Three times a day (TID) | ORAL | 0 refills | Status: AC | PRN

## 2023-03-19 MED ORDER — ONDANSETRON HCL 4 MG PO TABS: 4.0000 mg | ORAL_TABLET | Freq: Three times a day (TID) | ORAL | 0 refills | Status: AC | PRN

## 2023-03-20 ENCOUNTER — Telehealth: Payer: Self-pay | Admitting: Podiatry

## 2023-03-20 DIAGNOSIS — M2042 Other hammer toe(s) (acquired), left foot: Secondary | ICD-10-CM | POA: Diagnosis not present

## 2023-03-20 DIAGNOSIS — M21542 Acquired clubfoot, left foot: Secondary | ICD-10-CM | POA: Diagnosis not present

## 2023-03-20 DIAGNOSIS — M25775 Osteophyte, left foot: Secondary | ICD-10-CM | POA: Diagnosis not present

## 2023-03-20 NOTE — Telephone Encounter (Signed)
Pt called and had surgery today and when at home got the chills and her asthma flared up and she was not sure what to do. Pleas call pt and advise

## 2023-03-23 ENCOUNTER — Telehealth: Payer: Self-pay | Admitting: *Deleted

## 2023-03-23 NOTE — Telephone Encounter (Signed)
Called and [facility] informing the patient of the message below from Dr. Aleene Davidson

## 2023-03-23 NOTE — Telephone Encounter (Signed)
-----   Message from Louann Sjogren, DPM sent at 03/20/2023  3:50 PM EDT ----- Regarding: RE: Patient having chills and asthma has flared up Thank you did attempt to call and no answer but would recommend reaching out to PCP for asthma  ----- Message ----- From: Verdie Shire, CMA Sent: 03/20/2023  12:20 PM EDT To: Louann Sjogren, DPM Subject: Patient having chills and asthma has flared #  Patient called to say she had surgery this morning with Dr. Al Corpus.  She stated that she having chills and her asthma has flared up.  No fever.    Asked the patient if she has taking her temp, and she stated that she did and it was (95.5) forehead temp.  Asked the patient who does she see for her asthma and she stated her PCP.    Informed the patient that I spoke with Dr. Geryl Rankins nurse, and she stated that she can reach out to her PCP regarding the chills and asthma flare up.    Patient denies and other symptoms or problems.

## 2023-03-24 ENCOUNTER — Telehealth: Payer: Self-pay | Admitting: *Deleted

## 2023-03-24 NOTE — Telephone Encounter (Signed)
Patient has been updated to take only the ace wrappings off for about 10-15 min.,re wrap loosely and to continue to take the pain meds as instructed, ice and elevate. She verbalized understanding.

## 2023-03-24 NOTE — Telephone Encounter (Signed)
Patient is calling because she is in a great deal of pain since surgery on Friday. The pain medicine is not helping, is elevating , icing behind the knee as instructed and only getting up to go to bathroom, please advise

## 2023-03-26 ENCOUNTER — Ambulatory Visit (INDEPENDENT_AMBULATORY_CARE_PROVIDER_SITE_OTHER): Payer: BC Managed Care – PPO | Admitting: Podiatry

## 2023-03-26 ENCOUNTER — Ambulatory Visit (INDEPENDENT_AMBULATORY_CARE_PROVIDER_SITE_OTHER): Payer: BC Managed Care – PPO

## 2023-03-26 ENCOUNTER — Encounter: Payer: Self-pay | Admitting: Podiatry

## 2023-03-26 DIAGNOSIS — M2042 Other hammer toe(s) (acquired), left foot: Secondary | ICD-10-CM

## 2023-03-26 DIAGNOSIS — Z9889 Other specified postprocedural states: Secondary | ICD-10-CM

## 2023-03-27 NOTE — Progress Notes (Signed)
She presents today for her first postop visit she is status post second metatarsal osteotomy with hammertoe repair #2 3 and 4 of her left foot with K wires.  She also had a distal exostectomy hallux left.  Currently denies fever chills nausea vomit muscle aches and pains states that she did not feel well for couple of days but now seems to be doing better.  Objective: Presents today cam boot and her sister from [place].  Once the boot was removed demonstrates a dry sterile dressing was intact once removed demonstrates sutures are intact margins well coapted K wires are in place.  Surgical foot appears to be healing very nicely.  Radiographs taken today demonstrate well opposed arthrodesis sites to toes 2 3 and 4 also a nice placement of the capital osteotomy with screws #2 left foot.  Assessment: Well-healing surgical foot.  Plan: Redressed today dressed a compressive dressing follow-up with her in 1 to 2 weeks.

## 2023-04-02 ENCOUNTER — Telehealth: Payer: Self-pay | Admitting: *Deleted

## 2023-04-02 NOTE — Telephone Encounter (Signed)
Patient is calling because a place on her post surgery foot, top area where cyst was removed is hurting for long periods of time for the past 2 days, waking her up out of her sleep. Spoke with the patient explaining to loosen the ace wrap only and continue to ice and elevate and to call us back if this doesn't help with pain. Verbalized understanding and said that she has only a few more pain pills remaining, trying to use them only as really necessary. Please advise.

## 2023-04-07 ENCOUNTER — Ambulatory Visit (INDEPENDENT_AMBULATORY_CARE_PROVIDER_SITE_OTHER): Payer: BC Managed Care – PPO | Admitting: Podiatry

## 2023-04-07 ENCOUNTER — Encounter: Payer: Self-pay | Admitting: Podiatry

## 2023-04-07 DIAGNOSIS — Z9889 Other specified postprocedural states: Secondary | ICD-10-CM

## 2023-04-07 DIAGNOSIS — M2042 Other hammer toe(s) (acquired), left foot: Secondary | ICD-10-CM

## 2023-04-07 MED ORDER — OXYCODONE-ACETAMINOPHEN 7.5-325 MG PO TABS: 1.0000 | ORAL_TABLET | ORAL | 0 refills | Status: AC | PRN

## 2023-04-07 NOTE — Progress Notes (Signed)
She presents today for second postop visit date of surgery was 03/20/2019 for second metatarsal osteotomy #2 and then hammertoe repair #2 3 and 4 and exostectomy of the hallux and ball of the left foot.  She denies fever chills nausea vomit muscle aches and pains states that she would like more pain medicine she only has 1 pain pill left and has not overutilize them.  She does go on to say that her blood sugars have been running high.  Objective: Vital signs stable she alert oriented x 3 dressing and was intact was removed demonstrates K wires are intact sutures are intact margins are coapting I removed some of the sutures today margins dehisced slightly on the metatarsal osteotomy incision so I decided not to remove the remainder of the sutures.  K wires are intact do not demonstrate any type of infection and they are not moving.  Assessment: Well-healing surgical foot.  Plan: Will leave the stitches in for another week or 2 I redressed the foot today dressed a compressive dressing she will continue the use of the cam boot.

## 2023-04-28 ENCOUNTER — Ambulatory Visit (INDEPENDENT_AMBULATORY_CARE_PROVIDER_SITE_OTHER): Payer: BC Managed Care – PPO | Admitting: Podiatry

## 2023-04-28 ENCOUNTER — Ambulatory Visit: Payer: BC Managed Care – PPO

## 2023-04-28 DIAGNOSIS — M2042 Other hammer toe(s) (acquired), left foot: Secondary | ICD-10-CM

## 2023-04-28 DIAGNOSIS — Z9889 Other specified postprocedural states: Secondary | ICD-10-CM

## 2023-04-28 NOTE — Progress Notes (Unsigned)
Called [facility] to have them resend the Prior Auth for the patient's percocet

## 2023-04-29 NOTE — Progress Notes (Signed)
She presents today date of surgery 03/20/2019 for left foot and second metatarsal osteotomy she seems to be doing quite well with this states that she is ready to get these pins out and the sutures.  Objective: Vital signs are stable she is alert and oriented x 3 K wires are intact toes 2 3 and 4 of the left foot sutures are intact margins appear to be well coapted.  Radiographs taken today demonstrate an osseously mature individual K wires retained to toes 2 3 and 4 in good position as well as a second metatarsal osteotomy with screw fixation.  All of this appears to be healing well though she is not quite consolidated the toes as of yet.  Assessment well-healing surgical foot.  Plan: Remove sutures today we are going to retain the K wires for another 2 to 3 weeks and I will follow-up with her at that time for another set of x-rays.  We did dispense a Darco shoe today.

## 2023-05-05 ENCOUNTER — Telehealth: Payer: Self-pay | Admitting: Podiatry

## 2023-05-05 NOTE — Telephone Encounter (Signed)
Pt pin came out of her foot last night , and she wants to know what she should do

## 2023-05-07 ENCOUNTER — Encounter: Payer: Self-pay | Admitting: Podiatry

## 2023-05-12 ENCOUNTER — Ambulatory Visit (INDEPENDENT_AMBULATORY_CARE_PROVIDER_SITE_OTHER): Payer: BC Managed Care – PPO | Admitting: Podiatry

## 2023-05-12 ENCOUNTER — Encounter: Payer: Self-pay | Admitting: Podiatry

## 2023-05-12 ENCOUNTER — Ambulatory Visit (INDEPENDENT_AMBULATORY_CARE_PROVIDER_SITE_OTHER): Payer: BC Managed Care – PPO

## 2023-05-12 DIAGNOSIS — L600 Ingrowing nail: Secondary | ICD-10-CM | POA: Diagnosis not present

## 2023-05-12 DIAGNOSIS — M2042 Other hammer toe(s) (acquired), left foot: Secondary | ICD-10-CM

## 2023-05-12 DIAGNOSIS — Z9889 Other specified postprocedural states: Secondary | ICD-10-CM | POA: Diagnosis not present

## 2023-05-12 MED ORDER — NEOMYCIN-POLYMYXIN-HC 1 % OT SOLN: OTIC | 1 refills | Status: AC

## 2023-05-12 NOTE — Progress Notes (Signed)
She presents today for a postop visit her date of surgery is 03/20/2019 for left foot second metatarsal osteotomy with hammertoe repair #2 #3 #4 of her left foot with K wires.  Recently the fourth toe of the K wire has come out, on its own but she is ready to have the other 2 removed today is also complaining about pain to the hallux tibial border right.  States it feels like it is ingrown.  States her blood sugars are doing good at this point.  Objective: Vital signs stable alert and oriented x 3 pulses are palpable.  2 remaining K wires to the left foot are intact.  Radiographs do demonstrate well-healed surgical toes.  She does have a sharp incurvated nail margin along the tibial border of the hallux right.  Assessment: Ingrown toenail tibial border hallux right and postop visit toes 2 3 and 4 and second met osteotomy.  Plan: Discussed etiology pathology and surgical therapies at this point chemical matricectomy is performed to the hallux right tibial border tolerated procedure well without complications.  Was given verbal and written home-going instructions for the care and soaking of the toe as well as a prescription for Cortisporin Otic to be applied twice daily after soaking.  Removed K wires from toes 2 and 3 today without any incidents radiographs confirm complete removal of the K wires.  And she will retain a Darco shoe on that foot for a few weeks.  I would like to follow-up with her in 3 weeks.

## 2023-05-12 NOTE — Patient Instructions (Signed)

## 2023-06-02 ENCOUNTER — Encounter: Payer: BC Managed Care – PPO | Admitting: Podiatry

## 2023-06-04 ENCOUNTER — Ambulatory Visit (INDEPENDENT_AMBULATORY_CARE_PROVIDER_SITE_OTHER): Payer: BC Managed Care – PPO | Admitting: Podiatry

## 2023-06-04 ENCOUNTER — Ambulatory Visit (INDEPENDENT_AMBULATORY_CARE_PROVIDER_SITE_OTHER): Payer: BC Managed Care – PPO

## 2023-06-04 ENCOUNTER — Encounter: Payer: Self-pay | Admitting: Podiatry

## 2023-06-04 DIAGNOSIS — L600 Ingrowing nail: Secondary | ICD-10-CM

## 2023-06-04 DIAGNOSIS — Z9889 Other specified postprocedural states: Secondary | ICD-10-CM

## 2023-06-04 DIAGNOSIS — M2042 Other hammer toe(s) (acquired), left foot: Secondary | ICD-10-CM | POA: Diagnosis not present

## 2023-06-04 NOTE — Progress Notes (Signed)
She presents today for her postop visit this should be her final postop visit but I will probably see her 1 more month.  Date of surgery 03/20/2023.  She had a left foot second metatarsal osteotomy with hammertoe repair #2 #3 #4 she states that they have been swollen and hurting some because she has been back to school walking only.  The nail has been using a little bit as she refers to the matricectomy site on the hallux right.  Objective: Vital signs are stable alert oriented x 3 there is no erythema edema cellulitis drainage or odor erythema appears to be healing very nicely on the right foot left foot does demonstrate some mild edema.  Assessment well-healing surgical foot.  Plan: I instructed her to wrap the toes on a daily basis and she can stay in her regular shoes but she is welcome to go back to her cam boot if necessary.  Particular when she goes to [place] with school.  Otherwise I will see her in 1 month at which time we will get x-rays.  This should be her final set at that point

## 2023-07-07 ENCOUNTER — Telehealth: Payer: Self-pay | Admitting: Podiatry

## 2023-07-07 NOTE — Telephone Encounter (Signed)
Lvm for pt to call to r/s appt due to staffing. This is 2nd message and My chart message sent as well previously.

## 2023-07-09 ENCOUNTER — Encounter: Payer: BC Managed Care – PPO | Admitting: Podiatry

## 2023-08-18 ENCOUNTER — Ambulatory Visit (INDEPENDENT_AMBULATORY_CARE_PROVIDER_SITE_OTHER): Payer: BC Managed Care – PPO | Admitting: Podiatry

## 2023-08-18 ENCOUNTER — Ambulatory Visit (INDEPENDENT_AMBULATORY_CARE_PROVIDER_SITE_OTHER): Payer: BC Managed Care – PPO

## 2023-08-18 ENCOUNTER — Encounter: Payer: Self-pay | Admitting: Podiatry

## 2023-08-18 DIAGNOSIS — M2042 Other hammer toe(s) (acquired), left foot: Secondary | ICD-10-CM

## 2023-08-18 DIAGNOSIS — Z9889 Other specified postprocedural states: Secondary | ICD-10-CM

## 2023-08-18 DIAGNOSIS — M7662 Achilles tendinitis, left leg: Secondary | ICD-10-CM

## 2023-08-18 NOTE — Progress Notes (Signed)
She presents today date of surgery 03/20/2023.  We did a second metatarsal osteotomy with hammertoe repairs #2 #3 #4 of her left foot.  She states that recently she developed some pain in the posterior aspect of the heel as well as in the fourth toe left foot.  Objective: Vital signs stable alert oriented x 3 there is no erythema edema cellulitis drainage or odor she does have some tenderness on palpation of the Achilles just superior to its insertion site on the posterior superior aspect of the calcaneus.  Radiographs confirmed today soft tissue increase in swelling at the insertion site of the Achilles.  The fourth toe does not demonstrate any signs of infection just some tenderness on palpation.  The radiographs taken today do demonstrate a complete fusion in this area.  Assessment: Mild Achilles tendinitis.  Plan: Follow-up with her in a couple of months she has been utilizing her cam boot and ice and I recommended the use of Voltaren cream as well.

## 2023-09-22 ENCOUNTER — Ambulatory Visit (INDEPENDENT_AMBULATORY_CARE_PROVIDER_SITE_OTHER): Payer: BC Managed Care – PPO | Admitting: Podiatry

## 2023-09-22 ENCOUNTER — Encounter: Payer: Self-pay | Admitting: Podiatry

## 2023-09-22 DIAGNOSIS — M7662 Achilles tendinitis, left leg: Secondary | ICD-10-CM | POA: Diagnosis not present

## 2023-09-22 NOTE — Progress Notes (Signed)
She presents today for follow-up of her Achilles tendinitis states that she stopped wearing the boot about a week or so ago and it feels pretty good other than just being tight.  She is complaining of pain to the second toe on the right foot and a subluxating DIPJ fourth toe right foot.  Objective: Vital signs stable alert oriented x 3.  Pulses are palpable.  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  Fourth toe demonstrates flexor contraction.  Assessment: Flexor contraction hammertoe deformity well-healing Achilles tendon.  Plan: Will have her back in the near future for a last of the day a flexor tenotomy fourth digit right foot we will once again address the second digit probably in the summertime.

## 2023-10-08 ENCOUNTER — Encounter: Payer: Self-pay | Admitting: Podiatry

## 2023-10-08 ENCOUNTER — Ambulatory Visit: Payer: 59 | Admitting: Podiatry

## 2023-10-08 DIAGNOSIS — M24574 Contracture, right foot: Secondary | ICD-10-CM | POA: Diagnosis not present

## 2023-10-08 DIAGNOSIS — M24576 Contracture, unspecified foot: Secondary | ICD-10-CM

## 2023-10-08 NOTE — Patient Instructions (Signed)
 Leave bandage in place and dry for 4 days, then remove. You may wash foot normally after removal of bandage. DO NOT SOAK FOOT! Dry completely afterwards and may use a bandaid over incision if needed. We will follow up with you in 1 weeks for recheck.

## 2023-10-12 NOTE — Progress Notes (Signed)
 She presents today for her tenotomy flexor fourth toe right foot and would like to know if we can do the third toe as well.  Objective: Vital signs are stable oriented x 3 pulses are palpable.  No changes in her past medical history medications allergies surgeries or social history.  Flexible flexor contracture deformity.  Primarily to toes #4 and #3 on the right foot.  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   Plan: After local anesthetic was administered today and the toes were prepped and draped in normal normal sterile fashion.  I proceeded with flexor tenotomy's utilizing an 18-gauge needle at the area of the PIPJ.  The toes were straight and the area was copiously lavaged small amount of Dermabond was placed with a dry sterile compressive dressing.  Tolerated the procedure well without complications.  She was given a Darco shoe and will leave these dressed for the next 3 to 5 days.  I will follow-up with her in 1 to 2 weeks.

## 2023-10-26 ENCOUNTER — Telehealth: Payer: Self-pay | Admitting: Podiatry

## 2023-10-29 ENCOUNTER — Ambulatory Visit: Payer: 59 | Admitting: Podiatry

## 2023-10-29 ENCOUNTER — Encounter: Payer: Self-pay | Admitting: Podiatry

## 2023-10-29 ENCOUNTER — Ambulatory Visit (INDEPENDENT_AMBULATORY_CARE_PROVIDER_SITE_OTHER): Payer: 59 | Admitting: Podiatry

## 2023-10-29 ENCOUNTER — Ambulatory Visit (INDEPENDENT_AMBULATORY_CARE_PROVIDER_SITE_OTHER): Payer: 59

## 2023-10-29 DIAGNOSIS — M2041 Other hammer toe(s) (acquired), right foot: Secondary | ICD-10-CM | POA: Diagnosis not present

## 2023-10-29 DIAGNOSIS — E1142 Type 2 diabetes mellitus with diabetic polyneuropathy: Secondary | ICD-10-CM

## 2023-10-29 MED ORDER — MELOXICAM 15 MG PO TABS
15.0000 mg | ORAL_TABLET | Freq: Every day | ORAL | 0 refills | Status: AC
Start: 2023-10-29 — End: 2023-11-12

## 2023-10-29 NOTE — Progress Notes (Signed)
Chief Complaint  Patient presents with   Procedure    Follow up tenotomy 3rd and 4th toes right   "Its the back of the 3rd toe. It just started to hurt this week"    HPI: 58 y.o. female presents today as follow-up from recent flexor tenotomy with Dr. Al Corpus on 10/08/2023 at the right foot third and fourth toes.  Over the past week, she reports of the toes have become painful.  She has been back in regular shoes for the last week or so.  Past Medical History:  Diagnosis Date   ALLERGIC RHINITIS 10/08/2007   Arthritis    ASTHMA 04/22/2007   Diabetes mellitus without complication (HCC)    GERD 04/22/2007   Headache(784.0) 10/08/2007   HYPERLIPIDEMIA 04/22/2007   HYPERTENSION 04/22/2007   IBS (irritable bowel syndrome)    constipation predominant   LOW BACK PAIN 06/27/2008   LUQ PAIN 09/06/2010   NAUSEA 09/06/2010   PONV (postoperative nausea and vomiting)     Past Surgical History:  Procedure Laterality Date   CESAREAN SECTION     KNEE ARTHROPLASTY Right 04/29/2013   Procedure: COMPUTER ASSISTED TOTAL KNEE ARTHROPLASTY;  Surgeon: Eldred Manges, MD;  Location: [facility] OR;  Service: Orthopedics;  Laterality: Right;  Right Total Knee Arthroplasty, Cemented   KNEE SURGERY  92, 96, 01, 08   right   KNEE SURGERY  88   left   LUMBAR FUSION  05   L4-5 S1   NASAL POLYP SURGERY  95   sinus   TUBAL LIGATION      Allergies  Allergen Reactions   Metformin And Related Nausea Only    nausea   Ozempic (0.25 Or 0.5 Mg-Dose) [Semaglutide(0.25 Or 0.5mg -Dos)]    Penicillins     Severe reaction - hospitlaized   Tizanidine Diarrhea    ROS denies any nausea, long, fever, chills, chest pain or shortness of breath.   Physical Exam: There were no vitals filed for this visit.  General: The patient is alert and oriented x3 in no acute distress.  Dermatology: Skin is warm, dry and supple bilateral lower extremities. Interspaces are clear of maceration and debris.  Tenotomy site is  well-healed  Vascular: Palpable pedal pulses bilaterally. Capillary refill within normal limits.   Neurological: Light touch sensation grossly intact bilateral feet.   Musculoskeletal Exam: Some contracture of the right foot 3rd and 4th toes. Tender on palpation of plantar aspect. Localized edema here.  Radiographic Exam:  Normal osseous mineralization. No fractures appreciated. Abnormal alignment of 3rd and 4th toe PIPJs, with some subluxation of the intermediate phalanx medially and in sagittal plane.  Assessment/Plan of Care: 1. Hammer toe of right foot      Meds ordered this encounter  Medications   meloxicam (MOBIC) 15 MG tablet    Sig: Take 1 tablet (15 mg total) by mouth daily for 14 days.    Dispense:  14 tablet    Refill:  0   None  Discussed clinical findings with patient today.  -Third and fourth toes were buddy splinted today using Coban with toes held in rectus position manually -Patient instructed to return to surgical shoe -2-week course of oral meloxicam prescribed to the patient -Continue buddy splinting -Follow-up in 1 to 2 weeks with Dr. Al Corpus.  Florentino Laabs L. Marchia Bond, AACFAS [facility]     [address]  [place], [place] [address]                Office [phone]  Fax [phone]

## 2023-11-05 ENCOUNTER — Ambulatory Visit: Payer: 59 | Admitting: Podiatry

## 2024-01-25 ENCOUNTER — Other Ambulatory Visit (HOSPITAL_BASED_OUTPATIENT_CLINIC_OR_DEPARTMENT_OTHER): Payer: Self-pay

## 2024-01-25 MED ORDER — MOUNJARO 7.5 MG/0.5ML ~~LOC~~ SOAJ
7.5000 mg | SUBCUTANEOUS | 6 refills | Status: DC
  Filled 2024-01-25 – 2024-01-29 (×2): qty 2, 28d supply, fill #0
  Filled 2024-02-26: qty 2, 28d supply, fill #1
  Filled 2024-03-26: qty 2, 28d supply, fill #2
  Filled 2024-04-25: qty 2, 28d supply, fill #3
  Filled 2024-06-04: qty 2, 28d supply, fill #4

## 2024-01-29 ENCOUNTER — Other Ambulatory Visit (HOSPITAL_BASED_OUTPATIENT_CLINIC_OR_DEPARTMENT_OTHER): Payer: Self-pay

## 2024-02-26 ENCOUNTER — Other Ambulatory Visit (HOSPITAL_BASED_OUTPATIENT_CLINIC_OR_DEPARTMENT_OTHER): Payer: Self-pay

## 2024-03-26 ENCOUNTER — Other Ambulatory Visit (HOSPITAL_BASED_OUTPATIENT_CLINIC_OR_DEPARTMENT_OTHER): Payer: Self-pay

## 2024-03-26 MED ORDER — REPATHA SURECLICK 140 MG/ML ~~LOC~~ SOAJ
1.0000 mL | SUBCUTANEOUS | 3 refills | Status: AC
  Filled 2024-03-26: qty 2, 28d supply, fill #0
  Filled 2024-03-26: qty 6, 84d supply, fill #0
  Filled 2024-04-25: qty 2, 28d supply, fill #1
  Filled 2024-06-04: qty 2, 28d supply, fill #2
  Filled 2024-08-30: qty 2, 28d supply, fill #3
  Filled 2024-10-18 – 2024-11-08 (×3): qty 2, 28d supply, fill #4

## 2024-04-25 ENCOUNTER — Other Ambulatory Visit (HOSPITAL_BASED_OUTPATIENT_CLINIC_OR_DEPARTMENT_OTHER): Payer: Self-pay

## 2024-05-17 ENCOUNTER — Other Ambulatory Visit (HOSPITAL_BASED_OUTPATIENT_CLINIC_OR_DEPARTMENT_OTHER): Payer: Self-pay

## 2024-06-04 ENCOUNTER — Other Ambulatory Visit (HOSPITAL_BASED_OUTPATIENT_CLINIC_OR_DEPARTMENT_OTHER): Payer: Self-pay

## 2024-06-17 ENCOUNTER — Ambulatory Visit (HOSPITAL_BASED_OUTPATIENT_CLINIC_OR_DEPARTMENT_OTHER): Admit: 2024-06-17 | Payer: Self-pay | Admitting: Orthopedic Surgery

## 2024-06-17 ENCOUNTER — Encounter (HOSPITAL_BASED_OUTPATIENT_CLINIC_OR_DEPARTMENT_OTHER): Payer: Self-pay

## 2024-06-17 ENCOUNTER — Other Ambulatory Visit: Payer: Self-pay

## 2024-06-17 SURGERY — FUSION, JOINT, WRIST
Anesthesia: Monitor Anesthesia Care | Site: Wrist | Laterality: Left

## 2024-06-20 LAB — SURGICAL PATHOLOGY

## 2024-06-20 NOTE — Progress Notes (Signed)
 Designer, television/film set note.   I assisted Limited wrist fusion on 06/17/24 at the  [facility] with Dr Delene .  I provided assistance on this case as follows: Set up, approach, traction for opening the joint, posterior interosseous nerve neurectomy, removing the scaphoid, preparation of the ulnar for bones for fusion, placement of internal fixation for fusion, generous bone grafting closure of the wound and application the dressing and splint.   Electronically signed by:  Arley JONELLE Curia, MD  06/20/2024 10:12 AM

## 2024-07-06 ENCOUNTER — Other Ambulatory Visit (HOSPITAL_BASED_OUTPATIENT_CLINIC_OR_DEPARTMENT_OTHER): Payer: Self-pay

## 2024-07-06 MED ORDER — MOUNJARO 10 MG/0.5ML ~~LOC~~ SOAJ
10.0000 mg | SUBCUTANEOUS | 1 refills | Status: DC
  Filled 2024-07-06: qty 2, 28d supply, fill #0
  Filled 2024-08-30: qty 2, 28d supply, fill #1

## 2024-07-06 MED ORDER — LANTUS SOLOSTAR 100 UNIT/ML ~~LOC~~ SOPN
15.0000 [IU] | PEN_INJECTOR | Freq: Every day | SUBCUTANEOUS | 5 refills | Status: AC
  Filled 2024-07-06 – 2024-11-08 (×3): qty 15, 100d supply, fill #0

## 2024-07-16 ENCOUNTER — Other Ambulatory Visit (HOSPITAL_BASED_OUTPATIENT_CLINIC_OR_DEPARTMENT_OTHER): Payer: Self-pay

## 2024-07-17 ENCOUNTER — Other Ambulatory Visit (HOSPITAL_BASED_OUTPATIENT_CLINIC_OR_DEPARTMENT_OTHER): Payer: Self-pay

## 2024-07-17 MED ORDER — DEXCOM G7 SENSOR MISC
1 refills | Status: DC
  Filled 2024-07-17: qty 9, 90d supply, fill #0

## 2024-07-18 ENCOUNTER — Other Ambulatory Visit (HOSPITAL_BASED_OUTPATIENT_CLINIC_OR_DEPARTMENT_OTHER): Payer: Self-pay

## 2024-07-20 ENCOUNTER — Ambulatory Visit: Admitting: Physician Assistant

## 2024-07-20 ENCOUNTER — Other Ambulatory Visit: Payer: Self-pay

## 2024-07-20 ENCOUNTER — Encounter: Payer: Self-pay | Admitting: Physician Assistant

## 2024-07-20 DIAGNOSIS — G8929 Other chronic pain: Secondary | ICD-10-CM

## 2024-07-20 DIAGNOSIS — M25562 Pain in left knee: Secondary | ICD-10-CM

## 2024-07-20 NOTE — Progress Notes (Signed)
 HPI: [name] is a 58 year old female who is seeing Dr. Barbarann in the past.  She had a right total knee arthroplasty performed by Dr. Barbarann in 2014.  She comes in today for left knee pain.  States that she has sharp pain in the knee with walking.  Knee does give way at times.  No injury.  She does state that both knees popped up particularly her right knee.  She walks for exercise but otherwise no quad strengthening.  She had recent left hand surgery on September 12 and is still recovering from that.  She presently is on Tylenol  and meloxicam  for multiple aches pains.  Feels that this does help with the right knee pain.  Because whenever she forgets to take it or does not take medications that she has increased pain in the right knee.  No injury to the knee.  She is diabetic with last hemoglobin A1c 5 months ago of 7.4.  Review of systems: Negative for fevers chills.  Physical exam: General Well-developed well-nourished female no distress. Psych: Alert and oriented x 3 Respirations: Nonlabored Bilateral knees: No abnormal warmth erythema or effusion well-healed surgical incision right knee.  Quad atrophy bilaterally.  No gross instability of either knee with valgus varus stressing.  Slight valgus deformity of the left knee.  Tenderness left knee medial joint line.  Clicking of the right knee with passive range of motion.  Left knee she has patellofemoral crepitus with range of motion.  Overall good range of motion of both knees.  McMurray's is negative on the left.   Radiographs: Left knee 2 views: Left knee is well located.  No acute fractures.  Moderate patella femoral arthritic changes with large osteophyte superior pole periarticular region.  Medial lateral joint lines overall well-maintained.  No bony abnormalities or bony lesions.  Impression: Left knee patellofemoral arthritis  Plan: Discussed with her the use of Voltaren gel 4 g 4 times daily.  Also discussed cortisone injections.  She states that  she does not like to receive cortisone 1 because of her diabetes and to because it makes her feel off.  Therefore recommended therapy discussed formal therapy versus knee friendly exercises and quad strengthening exercises which were shown.  She did like to do exercises on her own as she is currently in rehab for her left hand.  She will follow-up with us  on an as-needed basis pain persist or comes worse.  She may benefit from viscosupplementation injections in the future.

## 2024-07-26 ENCOUNTER — Other Ambulatory Visit (HOSPITAL_BASED_OUTPATIENT_CLINIC_OR_DEPARTMENT_OTHER): Payer: Self-pay

## 2024-07-26 MED ORDER — LANTUS SOLOSTAR 100 UNIT/ML ~~LOC~~ SOPN
18.0000 [IU] | PEN_INJECTOR | Freq: Every evening | SUBCUTANEOUS | 3 refills | Status: AC
  Filled 2024-07-26: qty 15, 83d supply, fill #0
  Filled 2024-11-08: qty 30, 166d supply, fill #0

## 2024-07-26 MED ORDER — DEXCOM G7 SENSOR MISC
1.0000 | 1 refills | Status: AC
  Filled 2024-11-08: qty 9, 90d supply, fill #0

## 2024-07-26 MED ORDER — MOUNJARO 10 MG/0.5ML ~~LOC~~ SOAJ
10.0000 mg | SUBCUTANEOUS | 4 refills | Status: AC
  Filled 2024-07-26 – 2024-11-08 (×2): qty 2, 28d supply, fill #0

## 2024-07-27 ENCOUNTER — Other Ambulatory Visit: Payer: Self-pay

## 2024-08-06 ENCOUNTER — Other Ambulatory Visit (HOSPITAL_BASED_OUTPATIENT_CLINIC_OR_DEPARTMENT_OTHER): Payer: Self-pay

## 2024-08-08 ENCOUNTER — Encounter: Payer: Self-pay | Admitting: Radiology

## 2024-08-30 ENCOUNTER — Other Ambulatory Visit (HOSPITAL_BASED_OUTPATIENT_CLINIC_OR_DEPARTMENT_OTHER): Payer: Self-pay

## 2024-10-05 ENCOUNTER — Other Ambulatory Visit (HOSPITAL_BASED_OUTPATIENT_CLINIC_OR_DEPARTMENT_OTHER): Payer: Self-pay

## 2024-10-05 MED ORDER — MOUNJARO 10 MG/0.5ML ~~LOC~~ SOAJ
10.0000 mg | SUBCUTANEOUS | 1 refills | Status: AC
  Filled 2024-10-05: qty 2, 28d supply, fill #0
  Filled 2024-10-18: qty 2, 28d supply, fill #1

## 2024-10-18 ENCOUNTER — Other Ambulatory Visit (HOSPITAL_BASED_OUTPATIENT_CLINIC_OR_DEPARTMENT_OTHER): Payer: Self-pay

## 2024-10-19 ENCOUNTER — Other Ambulatory Visit (HOSPITAL_BASED_OUTPATIENT_CLINIC_OR_DEPARTMENT_OTHER): Payer: Self-pay

## 2024-11-01 ENCOUNTER — Other Ambulatory Visit (HOSPITAL_BASED_OUTPATIENT_CLINIC_OR_DEPARTMENT_OTHER): Payer: Self-pay

## 2024-11-02 ENCOUNTER — Other Ambulatory Visit: Payer: Self-pay

## 2024-11-02 ENCOUNTER — Other Ambulatory Visit (HOSPITAL_BASED_OUTPATIENT_CLINIC_OR_DEPARTMENT_OTHER): Payer: Self-pay

## 2024-11-08 ENCOUNTER — Other Ambulatory Visit: Payer: Self-pay

## 2024-11-08 ENCOUNTER — Other Ambulatory Visit (HOSPITAL_BASED_OUTPATIENT_CLINIC_OR_DEPARTMENT_OTHER): Payer: Self-pay
